# Patient Record
Sex: Male | Born: 1980 | Race: White | Hispanic: No | Marital: Single | Smoking: Current every day smoker
Health system: Southern US, Community
[De-identification: ages and names within clinical notes are randomized; demographics above are authoritative.]

---
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
## 2006-11-11 ENCOUNTER — Emergency Department (HOSPITAL_COMMUNITY): Admission: EM | Admit: 2006-11-11 | Discharge: 2006-11-11 | Payer: Self-pay | Admitting: Emergency Medicine

## 2009-07-25 ENCOUNTER — Emergency Department (HOSPITAL_COMMUNITY): Admission: EM | Admit: 2009-07-25 | Discharge: 2009-07-25 | Payer: Self-pay | Admitting: Emergency Medicine

## 2010-09-02 ENCOUNTER — Emergency Department (HOSPITAL_COMMUNITY)
Admission: EM | Admit: 2010-09-02 | Discharge: 2010-09-02 | Disposition: A | Discharge status: Home / Self Care | Payer: Medicaid Other | Attending: Emergency Medicine | Admitting: Emergency Medicine

## 2010-09-02 DIAGNOSIS — R220 Localized swelling, mass and lump, head: Secondary | ICD-10-CM | POA: Insufficient documentation

## 2010-09-02 DIAGNOSIS — K047 Periapical abscess without sinus: Secondary | ICD-10-CM | POA: Insufficient documentation

## 2010-09-02 DIAGNOSIS — K089 Disorder of teeth and supporting structures, unspecified: Secondary | ICD-10-CM | POA: Insufficient documentation

## 2010-09-02 DIAGNOSIS — K029 Dental caries, unspecified: Secondary | ICD-10-CM | POA: Insufficient documentation

## 2010-11-22 ENCOUNTER — Emergency Department (HOSPITAL_COMMUNITY)
Admission: EM | Admit: 2010-11-22 | Discharge: 2010-11-23 | Disposition: A | Discharge status: Home / Self Care | Payer: Medicaid Other | Attending: Emergency Medicine | Admitting: Emergency Medicine

## 2010-11-22 DIAGNOSIS — R6883 Chills (without fever): Secondary | ICD-10-CM | POA: Insufficient documentation

## 2010-11-22 DIAGNOSIS — R220 Localized swelling, mass and lump, head: Secondary | ICD-10-CM | POA: Insufficient documentation

## 2010-11-22 DIAGNOSIS — K089 Disorder of teeth and supporting structures, unspecified: Secondary | ICD-10-CM | POA: Insufficient documentation

## 2010-11-22 DIAGNOSIS — R221 Localized swelling, mass and lump, neck: Secondary | ICD-10-CM | POA: Insufficient documentation

## 2010-11-22 DIAGNOSIS — K029 Dental caries, unspecified: Secondary | ICD-10-CM | POA: Insufficient documentation

## 2011-01-26 ENCOUNTER — Emergency Department (HOSPITAL_COMMUNITY)
Admission: EM | Admit: 2011-01-26 | Discharge: 2011-01-26 | Disposition: A | Discharge status: Home / Self Care | Payer: Medicaid Other | Attending: Emergency Medicine | Admitting: Emergency Medicine

## 2011-01-26 DIAGNOSIS — K089 Disorder of teeth and supporting structures, unspecified: Secondary | ICD-10-CM | POA: Insufficient documentation

## 2011-01-26 DIAGNOSIS — K0401 Reversible pulpitis: Secondary | ICD-10-CM | POA: Insufficient documentation

## 2011-01-26 DIAGNOSIS — K0381 Cracked tooth: Secondary | ICD-10-CM | POA: Insufficient documentation

## 2011-01-26 DIAGNOSIS — K029 Dental caries, unspecified: Secondary | ICD-10-CM | POA: Insufficient documentation

## 2011-01-27 ENCOUNTER — Emergency Department (HOSPITAL_COMMUNITY)
Admission: EM | Admit: 2011-01-27 | Discharge: 2011-01-27 | Disposition: A | Discharge status: Home / Self Care | Payer: Medicaid Other | Attending: Emergency Medicine | Admitting: Emergency Medicine

## 2011-01-27 DIAGNOSIS — K089 Disorder of teeth and supporting structures, unspecified: Secondary | ICD-10-CM | POA: Insufficient documentation

## 2011-01-27 DIAGNOSIS — K029 Dental caries, unspecified: Secondary | ICD-10-CM | POA: Insufficient documentation

## 2011-01-27 DIAGNOSIS — Z79899 Other long term (current) drug therapy: Secondary | ICD-10-CM | POA: Insufficient documentation

## 2011-10-02 ENCOUNTER — Encounter (HOSPITAL_COMMUNITY): Payer: Self-pay | Admitting: *Deleted

## 2011-10-02 ENCOUNTER — Emergency Department (HOSPITAL_COMMUNITY)
Admission: EM | Admit: 2011-10-02 | Discharge: 2011-10-02 | Disposition: A | Discharge status: Home / Self Care | Payer: Medicaid Other | Attending: Emergency Medicine | Admitting: Emergency Medicine

## 2011-10-02 DIAGNOSIS — K047 Periapical abscess without sinus: Secondary | ICD-10-CM

## 2011-10-02 DIAGNOSIS — K0889 Other specified disorders of teeth and supporting structures: Secondary | ICD-10-CM

## 2011-10-02 DIAGNOSIS — F17200 Nicotine dependence, unspecified, uncomplicated: Secondary | ICD-10-CM | POA: Insufficient documentation

## 2011-10-02 MED ORDER — CLINDAMYCIN HCL 300 MG PO CAPS: 300.0000 mg | ORAL_CAPSULE | Freq: Four times a day (QID) | ORAL | Status: AC

## 2011-10-02 NOTE — ED Provider Notes (Signed)
History     CSN: [id number]  Arrival date & time 10/02/11  0035   First MD Initiated Contact with Patient 10/02/11 0206      Chief Complaint  Patient presents with  . Dental Pain     HPI  History provided by the patient. Patient is a 31 year old male with no significant past medical history who presents with complaints of increased right canine tooth pain for the past several days. Patient reports having history of multiple teeth extractions due to poor dentition and caries. Patient states that originally he was supposed to have the right canine tooth removed as well but states that his oral surgeon missed this tooth. He has had off-and-on pains for many months. Patient reports having some swelling of the gums above tooth with occasional drainage of pus. This is worse in the morning. Pain of the tooth is worse with pressure eating. Patient denies any fever, chills, sweats. Pain is described as severe. He denies any other aggravating or alleviating factors.   History reviewed. No pertinent past medical history.  History reviewed. No pertinent past surgical history.  History reviewed. No pertinent family history.  History  Substance Use Topics  . Smoking status: Current Everyday Smoker  . Smokeless tobacco: Not on file  . Alcohol Use: Yes      Review of Systems  Constitutional: Negative for fever and chills.  All other systems reviewed and are negative.    Allergies  Amoxicillin and Penicillins  Home Medications  No current outpatient prescriptions on file.  BP 125/76  Pulse 67  Temp(Src) 98.6 F (37 C) (Oral)  Resp 16  SpO2 99%  Physical Exam  Nursing note and vitals reviewed. Constitutional: He is oriented to person, place, and time. He appears well-developed and well-nourished. No distress.  HENT:  Head: Normocephalic.  Mouth/Throat: Oropharynx is clear and moist.         Right upper canine tooth. Mild swelling and tenderness to the gums superior.  Multiple extracted teeth throughout mouth. Widespread dental decay of remaining teeth.  Neck: Normal range of motion. Neck supple.  Cardiovascular: Normal rate and regular rhythm.   Pulmonary/Chest: Effort normal and breath sounds normal. No respiratory distress. He has no wheezes.  Abdominal: Soft.  Lymphadenopathy:    He has no cervical adenopathy.  Neurological: He is alert and oriented to person, place, and time.  Psychiatric: He has a normal mood and affect. His behavior is normal.    ED Course  Procedures     1. Pain, dental   2. Periapical abscess       MDM  1:30 AM patient seen and evaluated. Patient in no acute distress.        Angus Seller, PA 10/02/11 2005

## 2011-10-02 NOTE — Discharge Instructions (Signed)
Please followup with a dentist or oral surgeon for continued evaluation and treatment. Return if you have any worsening pain, increased swelling, fever, chills, sweats.  Abscessed Tooth A tooth abscess is a collection of infected fluid (pus) from a bacterial infection in the inner part of the tooth (pulp). It usually occurs at the end of the tooth's root.  CAUSES   A very bad cavity (extensive tooth decay).   Trauma to the tooth, such as a broken or chipped tooth, that allows bacteria to enter into the pulp.  SYMPTOMS  Severe pain in and around the infected tooth.   Swelling and redness around the abscessed tooth or in the mouth or face.   Tenderness.   Pus drainage.   Bad breath.   Bitter taste in the mouth.   Difficulty swallowing.   Difficulty opening the mouth.   Feeling sick to your stomach (nauseous).   Vomiting.   Chills.   Swollen neck glands.  DIAGNOSIS  A medical and dental history will be taken.   An examination will be performed by tapping on the abscessed tooth.   X-rays may be taken of the tooth to identify the abscess.  TREATMENT The goal of treatment is to eliminate the infection.   You may be prescribed antibiotic medicine to stop the infection from spreading.   A root canal may be performed to save the tooth. If the tooth cannot be saved, it may be pulled (extracted) and the abscess may be drained.  HOME CARE INSTRUCTIONS  Only take over-the-counter or prescription medicines for pain, fever, or discomfort as directed by your caregiver.   Do not drive after taking pain medicine (narcotics).   Rinse your mouth (gargle) often with salt water ( tsp salt in 8 oz of warm water) to relieve pain or swelling.   Do not apply heat to the outside of your face.   Return to your dentist for further treatment as directed.  SEEK IMMEDIATE DENTAL CARE IF:  You have a temperature by mouth above 102 F (38.9 C), not controlled by medicine.   You have  chills or a very bad headache.   You have problems breathing or swallowing.   Your have trouble opening your mouth.   You develop swelling in the neck or around the eye.   Your pain is not helped by medicine.   Your pain is getting worse instead of better.  Document Released: 07/08/2005 Document Revised: 06/27/2011 Document Reviewed: 10/16/2010 [facility] Patient Information 2012 [place], [place].    Dental Care and Dentist Visits Dental care supports good overall health. Regular dental visits can also help you avoid dental pain, bleeding, infection, and other more serious health problems in the future. It is important to keep the mouth healthy because diseases in the teeth, gums, and other oral tissues can spread to other areas of the body. Some problems, such as diabetes, heart disease, and pre-term labor have been associated with poor oral health.  See your dentist every 6 months. If you experience emergency problems such as a toothache or broken tooth, go to the dentist right away. If you see your dentist regularly, you may catch problems early. It is easier to be treated for problems in the early stages.  WHAT TO EXPECT AT A DENTIST VISIT  Your dentist will look for many common oral health problems and recommend proper treatment. At your regular dental visit, you can expect:  Gentle cleaning of the teeth and gums. This includes scraping and polishing.  This helps to remove the sticky substance around the teeth and gums (plaque). Plaque forms in the mouth shortly after eating. Over time, plaque hardens on the teeth as tartar. If tartar is not removed regularly, it can cause problems. Cleaning also helps remove stains.   Periodic X-rays. These pictures of the teeth and supporting bone will help your dentist assess the health of your teeth.   Periodic fluoride treatments. Fluoride is a natural mineral shown to help strengthen teeth. Fluoride treatmentinvolves applying a fluoride gel or  varnish to the teeth. It is most commonly done in children.   Examination of the mouth, tongue, jaws, teeth, and gums to look for any oral health problems, such as:   Cavities (dental caries). This is decay on the tooth caused by plaque, sugar, and acid in the mouth. It is best to catch a cavity when it is small.   Inflammation of the gums caused by plaque buildup (gingivitis).   Problems with the mouth or malformed or misaligned teeth.   Oral cancer or other diseases of the soft tissues or jaws.  KEEP YOUR TEETH AND GUMS HEALTHY For healthy teeth and gums, follow these general guidelines as well as your dentist's specific advice:  Have your teeth professionally cleaned at the dentist every 6 months.   Brush twice daily with a fluoride toothpaste.   Floss your teeth daily.   Ask your dentist if you need fluoride supplements, treatments, or fluoride toothpaste.   Eat a healthy diet. Reduce foods and drinks with added sugar.   Avoid smoking.  TREATMENT FOR ORAL HEALTH PROBLEMS If you have oral health problems, treatment varies depending on the conditions present in your teeth and gums.  Your caregiver will most likely recommend good oral hygiene at each visit.   For cavities, gingivitis, or other oral health disease, your caregiver will perform a procedure to treat the problem. This is typically done at a separate appointment. Sometimes your caregiver will refer you to another dental specialist for specific tooth problems or for surgery.  SEEK IMMEDIATE DENTAL CARE IF:  You have pain, bleeding, or soreness in the gum, tooth, jaw, or mouth area.   A permanent tooth becomes loose or separated from the gum socket.   You experience a blow or injury to the mouth or jaw area.  Document Released: 03/20/2011 Document Revised: 06/27/2011 Document Reviewed: 03/20/2011 [facility] Patient Information 2012 [place], [place].

## 2011-10-02 NOTE — ED Notes (Signed)
Patient here with c/o right upper tooth pain

## 2011-10-03 NOTE — ED Provider Notes (Signed)
Medical screening examination/treatment/procedure(s) were performed by non-physician practitioner and as supervising physician I was immediately available for consultation/collaboration.  Burdette Gergely, MD 10/03/11 0246 

## 2016-05-04 ENCOUNTER — Inpatient Hospital Stay (HOSPITAL_COMMUNITY)
Admission: EM | Admit: 2016-05-04 | Discharge: 2016-05-10 | DRG: 029 | Disposition: A | Discharge status: Rehabilitation Facility | Payer: Medicaid Other | Attending: Neurological Surgery | Admitting: Neurological Surgery

## 2016-05-04 ENCOUNTER — Encounter (HOSPITAL_COMMUNITY): Payer: Self-pay | Admitting: Emergency Medicine

## 2016-05-04 ENCOUNTER — Emergency Department (HOSPITAL_COMMUNITY): Payer: Medicaid Other

## 2016-05-04 DIAGNOSIS — K5900 Constipation, unspecified: Secondary | ICD-10-CM | POA: Diagnosis not present

## 2016-05-04 DIAGNOSIS — F1010 Alcohol abuse, uncomplicated: Secondary | ICD-10-CM

## 2016-05-04 DIAGNOSIS — R339 Retention of urine, unspecified: Secondary | ICD-10-CM | POA: Diagnosis not present

## 2016-05-04 DIAGNOSIS — Z720 Tobacco use: Secondary | ICD-10-CM

## 2016-05-04 DIAGNOSIS — S14101A Unspecified injury at C1 level of cervical spinal cord, initial encounter: Secondary | ICD-10-CM | POA: Diagnosis present

## 2016-05-04 DIAGNOSIS — S0121XA Laceration without foreign body of nose, initial encounter: Secondary | ICD-10-CM | POA: Diagnosis present

## 2016-05-04 DIAGNOSIS — N319 Neuromuscular dysfunction of bladder, unspecified: Secondary | ICD-10-CM | POA: Diagnosis present

## 2016-05-04 DIAGNOSIS — G8918 Other acute postprocedural pain: Secondary | ICD-10-CM

## 2016-05-04 DIAGNOSIS — Z419 Encounter for procedure for purposes other than remedying health state, unspecified: Secondary | ICD-10-CM

## 2016-05-04 DIAGNOSIS — M6289 Other specified disorders of muscle: Secondary | ICD-10-CM

## 2016-05-04 DIAGNOSIS — S14105A Unspecified injury at C5 level of cervical spinal cord, initial encounter: Principal | ICD-10-CM | POA: Diagnosis present

## 2016-05-04 DIAGNOSIS — S0091XA Abrasion of unspecified part of head, initial encounter: Secondary | ICD-10-CM | POA: Diagnosis present

## 2016-05-04 DIAGNOSIS — F17200 Nicotine dependence, unspecified, uncomplicated: Secondary | ICD-10-CM | POA: Diagnosis present

## 2016-05-04 DIAGNOSIS — S01411A Laceration without foreign body of right cheek and temporomandibular area, initial encounter: Secondary | ICD-10-CM | POA: Diagnosis present

## 2016-05-04 DIAGNOSIS — M792 Neuralgia and neuritis, unspecified: Secondary | ICD-10-CM

## 2016-05-04 DIAGNOSIS — N179 Acute kidney failure, unspecified: Secondary | ICD-10-CM

## 2016-05-04 DIAGNOSIS — R509 Fever, unspecified: Secondary | ICD-10-CM

## 2016-05-04 DIAGNOSIS — R2689 Other abnormalities of gait and mobility: Secondary | ICD-10-CM

## 2016-05-04 DIAGNOSIS — D62 Acute posthemorrhagic anemia: Secondary | ICD-10-CM

## 2016-05-04 DIAGNOSIS — K592 Neurogenic bowel, not elsewhere classified: Secondary | ICD-10-CM | POA: Diagnosis present

## 2016-05-04 DIAGNOSIS — S01511A Laceration without foreign body of lip, initial encounter: Secondary | ICD-10-CM | POA: Diagnosis present

## 2016-05-04 DIAGNOSIS — M6281 Muscle weakness (generalized): Secondary | ICD-10-CM

## 2016-05-04 DIAGNOSIS — D72820 Lymphocytosis (symptomatic): Secondary | ICD-10-CM

## 2016-05-04 DIAGNOSIS — M4802 Spinal stenosis, cervical region: Secondary | ICD-10-CM

## 2016-05-04 DIAGNOSIS — R29898 Other symptoms and signs involving the musculoskeletal system: Secondary | ICD-10-CM

## 2016-05-04 LAB — I-STAT CHEM 8, ED
BUN: 10 mg/dL (ref 6–20)
CHLORIDE: 101 mmol/L (ref 101–111)
CREATININE: 1.2 mg/dL (ref 0.61–1.24)
Calcium, Ion: 1.07 mmol/L — ABNORMAL LOW (ref 1.15–1.40)
Glucose, Bld: 92 mg/dL (ref 65–99)
HEMATOCRIT: 39 % (ref 39.0–52.0)
Hemoglobin: 13.3 g/dL (ref 13.0–17.0)
Potassium: 3.8 mmol/L (ref 3.5–5.1)
Sodium: 138 mmol/L (ref 135–145)
TCO2: 22 mmol/L (ref 0–100)

## 2016-05-04 LAB — CBC
HCT: 39.1 % (ref 39.0–52.0)
HEMOGLOBIN: 13.4 g/dL (ref 13.0–17.0)
MCH: 30.4 pg (ref 26.0–34.0)
MCHC: 34.3 g/dL (ref 30.0–36.0)
MCV: 88.7 fL (ref 78.0–100.0)
Platelets: 193 10*3/uL (ref 150–400)
RBC: 4.41 MIL/uL (ref 4.22–5.81)
RDW: 12.6 % (ref 11.5–15.5)
WBC: 7.8 10*3/uL (ref 4.0–10.5)

## 2016-05-04 LAB — SAMPLE TO BLOOD BANK

## 2016-05-04 LAB — I-STAT CG4 LACTIC ACID, ED: LACTIC ACID, VENOUS: 1.57 mmol/L (ref 0.5–1.9)

## 2016-05-04 MED ORDER — IOPAMIDOL (ISOVUE-300) INJECTION 61%
INTRAVENOUS | Status: AC
  Administered 2016-05-05: 100 mL
  Filled 2016-05-04: qty 100
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
## 2016-05-04 NOTE — ED Notes (Signed)
Pt reports ETOH on board.
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
## 2016-05-05 ENCOUNTER — Inpatient Hospital Stay (HOSPITAL_COMMUNITY): Payer: Medicaid Other | Admitting: Anesthesiology

## 2016-05-05 ENCOUNTER — Emergency Department (HOSPITAL_COMMUNITY): Payer: Medicaid Other

## 2016-05-05 ENCOUNTER — Encounter (HOSPITAL_COMMUNITY)
Admission: EM | Disposition: A | Discharge status: Rehabilitation Facility | Payer: Self-pay | Source: Home / Self Care | Attending: Neurological Surgery

## 2016-05-05 ENCOUNTER — Inpatient Hospital Stay (HOSPITAL_COMMUNITY): Payer: Medicaid Other

## 2016-05-05 ENCOUNTER — Encounter (HOSPITAL_COMMUNITY): Payer: Self-pay | Admitting: Critical Care Medicine

## 2016-05-05 DIAGNOSIS — F1010 Alcohol abuse, uncomplicated: Secondary | ICD-10-CM | POA: Diagnosis present

## 2016-05-05 DIAGNOSIS — G8250 Quadriplegia, unspecified: Secondary | ICD-10-CM | POA: Diagnosis not present

## 2016-05-05 DIAGNOSIS — D62 Acute posthemorrhagic anemia: Secondary | ICD-10-CM | POA: Diagnosis not present

## 2016-05-05 DIAGNOSIS — R339 Retention of urine, unspecified: Secondary | ICD-10-CM | POA: Diagnosis not present

## 2016-05-05 DIAGNOSIS — S0091XA Abrasion of unspecified part of head, initial encounter: Secondary | ICD-10-CM | POA: Diagnosis present

## 2016-05-05 DIAGNOSIS — F17200 Nicotine dependence, unspecified, uncomplicated: Secondary | ICD-10-CM | POA: Diagnosis present

## 2016-05-05 DIAGNOSIS — N319 Neuromuscular dysfunction of bladder, unspecified: Secondary | ICD-10-CM | POA: Diagnosis not present

## 2016-05-05 DIAGNOSIS — S14101A Unspecified injury at C1 level of cervical spinal cord, initial encounter: Secondary | ICD-10-CM | POA: Diagnosis present

## 2016-05-05 DIAGNOSIS — S14101D Unspecified injury at C1 level of cervical spinal cord, subsequent encounter: Secondary | ICD-10-CM | POA: Diagnosis not present

## 2016-05-05 DIAGNOSIS — N390 Urinary tract infection, site not specified: Secondary | ICD-10-CM | POA: Diagnosis not present

## 2016-05-05 DIAGNOSIS — K592 Neurogenic bowel, not elsewhere classified: Secondary | ICD-10-CM | POA: Diagnosis present

## 2016-05-05 DIAGNOSIS — G904 Autonomic dysreflexia: Secondary | ICD-10-CM | POA: Diagnosis not present

## 2016-05-05 DIAGNOSIS — M62838 Other muscle spasm: Secondary | ICD-10-CM | POA: Diagnosis not present

## 2016-05-05 DIAGNOSIS — S01411A Laceration without foreign body of right cheek and temporomandibular area, initial encounter: Secondary | ICD-10-CM | POA: Diagnosis present

## 2016-05-05 DIAGNOSIS — S0121XA Laceration without foreign body of nose, initial encounter: Secondary | ICD-10-CM | POA: Diagnosis present

## 2016-05-05 DIAGNOSIS — M4802 Spinal stenosis, cervical region: Secondary | ICD-10-CM | POA: Diagnosis present

## 2016-05-05 DIAGNOSIS — Z419 Encounter for procedure for purposes other than remedying health state, unspecified: Secondary | ICD-10-CM | POA: Diagnosis not present

## 2016-05-05 DIAGNOSIS — M7989 Other specified soft tissue disorders: Secondary | ICD-10-CM | POA: Diagnosis not present

## 2016-05-05 DIAGNOSIS — K5900 Constipation, unspecified: Secondary | ICD-10-CM | POA: Diagnosis not present

## 2016-05-05 DIAGNOSIS — S01511A Laceration without foreign body of lip, initial encounter: Secondary | ICD-10-CM | POA: Diagnosis present

## 2016-05-05 DIAGNOSIS — M792 Neuralgia and neuritis, unspecified: Secondary | ICD-10-CM | POA: Diagnosis not present

## 2016-05-05 DIAGNOSIS — S14105A Unspecified injury at C5 level of cervical spinal cord, initial encounter: Secondary | ICD-10-CM | POA: Diagnosis present

## 2016-05-05 DIAGNOSIS — S14101S Unspecified injury at C1 level of cervical spinal cord, sequela: Secondary | ICD-10-CM | POA: Diagnosis not present

## 2016-05-05 HISTORY — PX: ANTERIOR CERVICAL DECOMP/DISCECTOMY FUSION: SHX1161

## 2016-05-05 LAB — PROTIME-INR
INR: 1.07
PROTHROMBIN TIME: 13.9 s (ref 11.4–15.2)

## 2016-05-05 LAB — URINALYSIS, ROUTINE W REFLEX MICROSCOPIC
Bilirubin Urine: NEGATIVE
Glucose, UA: NEGATIVE mg/dL
Hgb urine dipstick: NEGATIVE
KETONES UR: NEGATIVE mg/dL
LEUKOCYTES UA: NEGATIVE
Nitrite: NEGATIVE
PROTEIN: NEGATIVE mg/dL
SPECIFIC GRAVITY, URINE: 1.037 — AB (ref 1.005–1.030)
pH: 6 (ref 5.0–8.0)

## 2016-05-05 LAB — MRSA PCR SCREENING: MRSA by PCR: NEGATIVE

## 2016-05-05 LAB — COMPREHENSIVE METABOLIC PANEL
ALBUMIN: 4 g/dL (ref 3.5–5.0)
ALT: 15 U/L — AB (ref 17–63)
AST: 20 U/L (ref 15–41)
Alkaline Phosphatase: 58 U/L (ref 38–126)
Anion gap: 10 (ref 5–15)
BILIRUBIN TOTAL: 0.4 mg/dL (ref 0.3–1.2)
BUN: 10 mg/dL (ref 6–20)
CHLORIDE: 103 mmol/L (ref 101–111)
CO2: 21 mmol/L — ABNORMAL LOW (ref 22–32)
CREATININE: 0.96 mg/dL (ref 0.61–1.24)
Calcium: 8.8 mg/dL — ABNORMAL LOW (ref 8.9–10.3)
GFR calc Af Amer: >60 mL/min (ref >60)
GLUCOSE: 95 mg/dL (ref 65–99)
POTASSIUM: 3.8 mmol/L (ref 3.5–5.1)
Sodium: 134 mmol/L — ABNORMAL LOW (ref 135–145)
Total Protein: 6.4 g/dL — ABNORMAL LOW (ref 6.5–8.1)

## 2016-05-05 LAB — ETHANOL: Alcohol, Ethyl (B): 148 mg/dL — ABNORMAL HIGH (ref <5)

## 2016-05-05 LAB — CDS SEROLOGY

## 2016-05-05 SURGERY — ANTERIOR CERVICAL DECOMPRESSION/DISCECTOMY FUSION 2 LEVELS
Anesthesia: General | Site: Neck

## 2016-05-05 MED ORDER — FLEET ENEMA 7-19 GM/118ML RE ENEM: 1.0000 | ENEMA | Freq: Once | RECTAL | Status: DC | PRN

## 2016-05-05 MED ORDER — HYDROMORPHONE HCL 1 MG/ML IJ SOLN
1.0000 mg | INTRAMUSCULAR | Status: AC | PRN
  Administered 2016-05-08 (×3): 1 mg via INTRAVENOUS
  Filled 2016-05-05 (×3): qty 1

## 2016-05-05 MED ORDER — LIDOCAINE 2% (20 MG/ML) 5 ML SYRINGE
INTRAMUSCULAR | Status: DC | PRN
  Administered 2016-05-05: 80 mg via INTRAVENOUS

## 2016-05-05 MED ORDER — SODIUM CHLORIDE 0.9 % IV SOLN
250.0000 mL | INTRAVENOUS | Status: DC
  Administered 2016-05-09: 250 mL via INTRAVENOUS

## 2016-05-05 MED ORDER — THROMBIN 20000 UNITS EX SOLR
CUTANEOUS | Status: AC
  Filled 2016-05-05: qty 20000

## 2016-05-05 MED ORDER — BISACODYL 10 MG RE SUPP: 10.0000 mg | Freq: Every day | RECTAL | Status: DC | PRN

## 2016-05-05 MED ORDER — BUPIVACAINE-EPINEPHRINE (PF) 0.5% -1:200000 IJ SOLN
INTRAMUSCULAR | Status: AC
  Filled 2016-05-05: qty 30

## 2016-05-05 MED ORDER — VANCOMYCIN HCL IN DEXTROSE 1-5 GM/200ML-% IV SOLN
1000.0000 mg | INTRAVENOUS | Status: AC
  Administered 2016-05-05: 1000 mg via INTRAVENOUS
  Filled 2016-05-05: qty 200

## 2016-05-05 MED ORDER — ONDANSETRON HCL 4 MG/2ML IJ SOLN
INTRAMUSCULAR | Status: DC | PRN
  Administered 2016-05-05: 4 mg via INTRAVENOUS

## 2016-05-05 MED ORDER — GABAPENTIN 300 MG PO CAPS
300.0000 mg | ORAL_CAPSULE | Freq: Three times a day (TID) | ORAL | Status: DC
  Administered 2016-05-05 – 2016-05-10 (×15): 300 mg via ORAL
  Filled 2016-05-05 (×15): qty 1

## 2016-05-05 MED ORDER — ACETAMINOPHEN 10 MG/ML IV SOLN
INTRAVENOUS | Status: DC | PRN
  Administered 2016-05-05: 1000 mg via INTRAVENOUS

## 2016-05-05 MED ORDER — ACETAMINOPHEN 10 MG/ML IV SOLN
INTRAVENOUS | Status: AC
Start: 2016-05-05 — End: 2016-05-05
  Filled 2016-05-05: qty 100

## 2016-05-05 MED ORDER — LACTATED RINGERS IV SOLN
INTRAVENOUS | Status: DC | PRN
  Administered 2016-05-05: 12:00:00 via INTRAVENOUS

## 2016-05-05 MED ORDER — ASPIRIN-ACETAMINOPHEN-CAFFEINE 250-250-65 MG PO TABS
1.0000 | ORAL_TABLET | Freq: Four times a day (QID) | ORAL | Status: DC | PRN
  Filled 2016-05-05: qty 2

## 2016-05-05 MED ORDER — VANCOMYCIN HCL 1000 MG IV SOLR
INTRAVENOUS | Status: AC
  Filled 2016-05-05: qty 1000

## 2016-05-05 MED ORDER — LIDOCAINE-EPINEPHRINE 2 %-1:100000 IJ SOLN
INTRAMUSCULAR | Status: DC | PRN
  Administered 2016-05-05: 10 mL via INTRADERMAL

## 2016-05-05 MED ORDER — OXYCODONE HCL 5 MG/5ML PO SOLN: 5.0000 mg | Freq: Once | ORAL | Status: DC | PRN

## 2016-05-05 MED ORDER — SUGAMMADEX SODIUM 200 MG/2ML IV SOLN
INTRAVENOUS | Status: DC | PRN
  Administered 2016-05-05: 200 mg via INTRAVENOUS

## 2016-05-05 MED ORDER — SODIUM CHLORIDE 0.9% FLUSH
3.0000 mL | Freq: Two times a day (BID) | INTRAVENOUS | Status: DC
  Administered 2016-05-05 – 2016-05-06 (×2): 3 mL via INTRAVENOUS
  Administered 2016-05-07: 10 mL via INTRAVENOUS
  Administered 2016-05-08 – 2016-05-10 (×4): 3 mL via INTRAVENOUS

## 2016-05-05 MED ORDER — PANTOPRAZOLE SODIUM 20 MG PO TBEC
20.0000 mg | DELAYED_RELEASE_TABLET | Freq: Every day | ORAL | Status: DC
  Administered 2016-05-05 – 2016-05-10 (×6): 20 mg via ORAL
  Filled 2016-05-05 (×6): qty 1

## 2016-05-05 MED ORDER — DOCUSATE SODIUM 100 MG PO CAPS
100.0000 mg | ORAL_CAPSULE | Freq: Two times a day (BID) | ORAL | Status: DC
  Administered 2016-05-05 – 2016-05-10 (×10): 100 mg via ORAL
  Filled 2016-05-05 (×10): qty 1

## 2016-05-05 MED ORDER — ROCURONIUM BROMIDE 50 MG/5ML IV SOSY
PREFILLED_SYRINGE | INTRAVENOUS | Status: DC | PRN
  Administered 2016-05-05: 50 mg via INTRAVENOUS
  Administered 2016-05-05: 10 mg via INTRAVENOUS

## 2016-05-05 MED ORDER — LORAZEPAM 2 MG/ML IJ SOLN
1.0000 mg | Freq: Once | INTRAMUSCULAR | Status: AC
  Administered 2016-05-05: 1 mg via INTRAVENOUS

## 2016-05-05 MED ORDER — SURGIFOAM 100 EX MISC
CUTANEOUS | Status: DC | PRN
  Administered 2016-05-05: 20 mL via TOPICAL

## 2016-05-05 MED ORDER — ARTIFICIAL TEARS OP OINT
TOPICAL_OINTMENT | OPHTHALMIC | Status: DC | PRN
  Administered 2016-05-05: 1 via OPHTHALMIC

## 2016-05-05 MED ORDER — SENNA 8.6 MG PO TABS
1.0000 | ORAL_TABLET | Freq: Two times a day (BID) | ORAL | Status: DC
  Administered 2016-05-05 – 2016-05-07 (×4): 8.6 mg via ORAL
  Administered 2016-05-07: 17.2 mg via ORAL
  Administered 2016-05-08 – 2016-05-10 (×5): 8.6 mg via ORAL
  Filled 2016-05-05 (×10): qty 1

## 2016-05-05 MED ORDER — PHENYLEPHRINE 40 MCG/ML (10ML) SYRINGE FOR IV PUSH (FOR BLOOD PRESSURE SUPPORT)
PREFILLED_SYRINGE | INTRAVENOUS | Status: DC | PRN
  Administered 2016-05-05 (×2): 80 ug via INTRAVENOUS
  Administered 2016-05-05: 40 ug via INTRAVENOUS
  Administered 2016-05-05: 80 ug via INTRAVENOUS
  Administered 2016-05-05 (×4): 40 ug via INTRAVENOUS

## 2016-05-05 MED ORDER — THROMBIN 5000 UNITS EX SOLR
OROMUCOSAL | Status: DC | PRN
  Administered 2016-05-05: 5 mL via TOPICAL

## 2016-05-05 MED ORDER — SUCCINYLCHOLINE CHLORIDE 200 MG/10ML IV SOSY
PREFILLED_SYRINGE | INTRAVENOUS | Status: DC | PRN
  Administered 2016-05-05: 100 mg via INTRAVENOUS

## 2016-05-05 MED ORDER — ACETAMINOPHEN 500 MG PO TABS
1000.0000 mg | ORAL_TABLET | Freq: Four times a day (QID) | ORAL | Status: DC
  Administered 2016-05-05 – 2016-05-10 (×19): 1000 mg via ORAL
  Filled 2016-05-05 (×21): qty 2

## 2016-05-05 MED ORDER — FENTANYL CITRATE (PF) 100 MCG/2ML IJ SOLN
INTRAMUSCULAR | Status: DC | PRN
  Administered 2016-05-05 (×2): 50 ug via INTRAVENOUS
  Administered 2016-05-05: 100 ug via INTRAVENOUS

## 2016-05-05 MED ORDER — LORAZEPAM 2 MG/ML IJ SOLN
INTRAMUSCULAR | Status: AC
  Filled 2016-05-05: qty 1

## 2016-05-05 MED ORDER — METHOCARBAMOL 750 MG PO TABS
750.0000 mg | ORAL_TABLET | Freq: Four times a day (QID) | ORAL | Status: DC
  Administered 2016-05-05 – 2016-05-06 (×6): 750 mg via ORAL
  Administered 2016-05-07: 1000 mg via ORAL
  Administered 2016-05-07 – 2016-05-10 (×13): 750 mg via ORAL
  Filled 2016-05-05 (×5): qty 1
  Filled 2016-05-05 (×3): qty 2
  Filled 2016-05-05: qty 1.5
  Filled 2016-05-05 (×2): qty 2
  Filled 2016-05-05: qty 1
  Filled 2016-05-05 (×2): qty 2
  Filled 2016-05-05: qty 1
  Filled 2016-05-05 (×6): qty 2

## 2016-05-05 MED ORDER — OXYCODONE HCL 5 MG PO TABS
5.0000 mg | ORAL_TABLET | ORAL | Status: DC | PRN
  Administered 2016-05-06 – 2016-05-10 (×18): 10 mg via ORAL
  Filled 2016-05-05 (×19): qty 2

## 2016-05-05 MED ORDER — CELECOXIB 200 MG PO CAPS
200.0000 mg | ORAL_CAPSULE | Freq: Two times a day (BID) | ORAL | Status: DC
  Administered 2016-05-05 – 2016-05-10 (×10): 200 mg via ORAL
  Filled 2016-05-05 (×11): qty 1

## 2016-05-05 MED ORDER — THROMBIN 5000 UNITS EX SOLR
CUTANEOUS | Status: AC
  Filled 2016-05-05: qty 10000

## 2016-05-05 MED ORDER — PROPOFOL 10 MG/ML IV BOLUS
INTRAVENOUS | Status: DC | PRN
  Administered 2016-05-05: 20 mg via INTRAVENOUS
  Administered 2016-05-05: 200 mg via INTRAVENOUS
  Administered 2016-05-05: 70 mg via INTRAVENOUS

## 2016-05-05 MED ORDER — ONDANSETRON HCL 4 MG/2ML IJ SOLN
INTRAMUSCULAR | Status: AC
  Filled 2016-05-05: qty 2

## 2016-05-05 MED ORDER — OXYCODONE HCL 5 MG PO TABS: 5.0000 mg | ORAL_TABLET | Freq: Once | ORAL | Status: DC | PRN

## 2016-05-05 MED ORDER — ONDANSETRON HCL 4 MG/2ML IJ SOLN: 4.0000 mg | INTRAMUSCULAR | Status: DC | PRN

## 2016-05-05 MED ORDER — VANCOMYCIN HCL IN DEXTROSE 1-5 GM/200ML-% IV SOLN
1000.0000 mg | Freq: Two times a day (BID) | INTRAVENOUS | Status: DC
  Administered 2016-05-06 – 2016-05-09 (×7): 1000 mg via INTRAVENOUS
  Filled 2016-05-05 (×9): qty 200

## 2016-05-05 MED ORDER — BUPIVACAINE-EPINEPHRINE (PF) 0.5% -1:200000 IJ SOLN
INTRAMUSCULAR | Status: DC | PRN
  Administered 2016-05-05: 10 mL

## 2016-05-05 MED ORDER — ONDANSETRON HCL 4 MG/2ML IJ SOLN: 4.0000 mg | Freq: Once | INTRAMUSCULAR | Status: DC | PRN

## 2016-05-05 MED ORDER — PHENYLEPHRINE 40 MCG/ML (10ML) SYRINGE FOR IV PUSH (FOR BLOOD PRESSURE SUPPORT)
PREFILLED_SYRINGE | INTRAVENOUS | Status: AC
  Filled 2016-05-05: qty 10

## 2016-05-05 MED ORDER — SODIUM CHLORIDE 0.9 % IR SOLN
Status: DC | PRN
  Administered 2016-05-05: 1

## 2016-05-05 MED ORDER — DEXAMETHASONE SODIUM PHOSPHATE 10 MG/ML IJ SOLN
INTRAMUSCULAR | Status: DC | PRN
  Administered 2016-05-05: 10 mg via INTRAVENOUS

## 2016-05-05 MED ORDER — BUPIVACAINE LIPOSOME 1.3 % IJ SUSP
INTRAMUSCULAR | Status: DC | PRN
  Administered 2016-05-05: 20 mL

## 2016-05-05 MED ORDER — FENTANYL CITRATE (PF) 100 MCG/2ML IJ SOLN
INTRAMUSCULAR | Status: AC
  Filled 2016-05-05: qty 2

## 2016-05-05 MED ORDER — DEXAMETHASONE SODIUM PHOSPHATE 10 MG/ML IJ SOLN
INTRAMUSCULAR | Status: AC
  Filled 2016-05-05: qty 1

## 2016-05-05 MED ORDER — BUPIVACAINE LIPOSOME 1.3 % IJ SUSP
20.0000 mL | Freq: Once | INTRAMUSCULAR | Status: DC
  Filled 2016-05-05: qty 20

## 2016-05-05 MED ORDER — SODIUM CHLORIDE 0.9% FLUSH: 3.0000 mL | INTRAVENOUS | Status: DC | PRN

## 2016-05-05 MED ORDER — OXYCODONE HCL ER 20 MG PO T12A
20.0000 mg | EXTENDED_RELEASE_TABLET | Freq: Two times a day (BID) | ORAL | Status: DC
  Administered 2016-05-05 – 2016-05-10 (×10): 20 mg via ORAL
  Filled 2016-05-05 (×10): qty 1

## 2016-05-05 MED ORDER — MIDAZOLAM HCL 5 MG/5ML IJ SOLN
INTRAMUSCULAR | Status: DC | PRN
  Administered 2016-05-05: 2 mg via INTRAVENOUS

## 2016-05-05 MED ORDER — 0.9 % SODIUM CHLORIDE (POUR BTL) OPTIME
TOPICAL | Status: DC | PRN
  Administered 2016-05-05: 1000 mL

## 2016-05-05 MED ORDER — TETANUS-DIPHTH-ACELL PERTUSSIS 5-2.5-18.5 LF-MCG/0.5 IM SUSP
0.5000 mL | Freq: Once | INTRAMUSCULAR | Status: AC
  Administered 2016-05-05: 0.5 mL via INTRAMUSCULAR
  Filled 2016-05-05: qty 0.5

## 2016-05-05 MED ORDER — MENTHOL 3 MG MT LOZG: 1.0000 | LOZENGE | OROMUCOSAL | Status: DC | PRN

## 2016-05-05 MED ORDER — BACITRACIN ZINC 500 UNIT/GM EX OINT
TOPICAL_OINTMENT | CUTANEOUS | Status: AC
  Filled 2016-05-05: qty 28.35

## 2016-05-05 MED ORDER — POTASSIUM CHLORIDE IN NACL 20-0.9 MEQ/L-% IV SOLN
100.0000 mL/h | INTRAVENOUS | Status: DC
  Administered 2016-05-05 – 2016-05-07 (×2): 100 mL/h via INTRAVENOUS
  Filled 2016-05-05 (×7): qty 1000

## 2016-05-05 MED ORDER — SODIUM CHLORIDE 0.9 % IR SOLN
Status: DC | PRN
  Administered 2016-05-05: 500 mL

## 2016-05-05 MED ORDER — VANCOMYCIN HCL 1000 MG IV SOLR
INTRAVENOUS | Status: DC | PRN
  Administered 2016-05-05: 1000 mg

## 2016-05-05 MED ORDER — ALUM & MAG HYDROXIDE-SIMETH 200-200-20 MG/5ML PO SUSP: 30.0000 mL | Freq: Four times a day (QID) | ORAL | Status: DC | PRN

## 2016-05-05 MED ORDER — MEPERIDINE HCL 25 MG/ML IJ SOLN: 6.2500 mg | INTRAMUSCULAR | Status: DC | PRN

## 2016-05-05 MED ORDER — PHENOL 1.4 % MT LIQD: 1.0000 | OROMUCOSAL | Status: DC | PRN

## 2016-05-05 MED ORDER — LIDOCAINE-EPINEPHRINE (PF) 2 %-1:200000 IJ SOLN
INTRAMUSCULAR | Status: AC
  Filled 2016-05-05: qty 20

## 2016-05-05 MED ORDER — CELECOXIB 200 MG PO CAPS
200.0000 mg | ORAL_CAPSULE | Freq: Once | ORAL | Status: AC
  Administered 2016-05-05: 200 mg via ORAL
  Filled 2016-05-05: qty 1

## 2016-05-05 MED ORDER — GABAPENTIN 300 MG PO CAPS
300.0000 mg | ORAL_CAPSULE | Freq: Once | ORAL | Status: AC
  Administered 2016-05-05: 300 mg via ORAL
  Filled 2016-05-05: qty 1

## 2016-05-05 MED ORDER — ENOXAPARIN SODIUM 40 MG/0.4ML ~~LOC~~ SOLN
40.0000 mg | SUBCUTANEOUS | Status: DC
  Administered 2016-05-07 – 2016-05-10 (×4): 40 mg via SUBCUTANEOUS
  Filled 2016-05-05 (×4): qty 0.4

## 2016-05-05 MED ORDER — HYDROMORPHONE HCL 1 MG/ML IJ SOLN: 0.2500 mg | INTRAMUSCULAR | Status: DC | PRN

## 2016-05-05 MED ORDER — MIDAZOLAM HCL 2 MG/2ML IJ SOLN
INTRAMUSCULAR | Status: AC
  Filled 2016-05-05: qty 2

## 2016-05-05 SURGICAL SUPPLY — 70 items
BIT DRILL NEURO 2X3.1 SFT TUCH (MISCELLANEOUS) ×1 IMPLANT
BIT DRILL VUEPOINT II (BIT) ×1 IMPLANT
BLADE SURG 11 STRL SS (BLADE) ×3 IMPLANT
BLADE ULTRA TIP 2M (BLADE) IMPLANT
BUR MATCHSTICK NEURO 3.0 LAGG (BURR) ×3 IMPLANT
CANISTER SUCT 3000ML PPV (MISCELLANEOUS) ×3 IMPLANT
CHLORAPREP W/TINT 26ML (MISCELLANEOUS) ×3 IMPLANT
DECANTER SPIKE VIAL GLASS SM (MISCELLANEOUS) ×3 IMPLANT
DERMABOND ADVANCED (GAUZE/BANDAGES/DRESSINGS) ×4
DERMABOND ADVANCED .7 DNX12 (GAUZE/BANDAGES/DRESSINGS) ×2 IMPLANT
DRAPE C-ARM 42X72 X-RAY (DRAPES) IMPLANT
DRAPE HALF SHEET 40X57 (DRAPES) ×6 IMPLANT
DRAPE LAPAROTOMY 100X72 PEDS (DRAPES) ×3 IMPLANT
DRAPE MICROSCOPE LEICA (MISCELLANEOUS) IMPLANT
DRAPE POUCH INSTRU U-SHP 10X18 (DRAPES) ×3 IMPLANT
DRAPE SHEET LG 3/4 BI-LAMINATE (DRAPES) ×3 IMPLANT
DRILL BIT VUEPOINT II (BIT) ×2
DRILL NEURO 2X3.1 SOFT TOUCH (MISCELLANEOUS) ×3
DRSG OPSITE POSTOP 4X6 (GAUZE/BANDAGES/DRESSINGS) ×3 IMPLANT
ELECT COATED BLADE 2.86 ST (ELECTRODE) ×3 IMPLANT
ELECT REM PT RETURN 9FT ADLT (ELECTROSURGICAL) ×3
ELECTRODE REM PT RTRN 9FT ADLT (ELECTROSURGICAL) ×1 IMPLANT
EVACUATOR 1/8 PVC DRAIN (DRAIN) ×3 IMPLANT
GAUZE SPONGE 4X4 12PLY STRL (GAUZE/BANDAGES/DRESSINGS) IMPLANT
GAUZE SPONGE 4X4 16PLY XRAY LF (GAUZE/BANDAGES/DRESSINGS) IMPLANT
GLOVE BIO SURGEON STRL SZ 6.5 (GLOVE) ×4 IMPLANT
GLOVE BIO SURGEON STRL SZ7 (GLOVE) ×9 IMPLANT
GLOVE BIO SURGEONS STRL SZ 6.5 (GLOVE) ×2
GLOVE BIOGEL PI IND STRL 7.5 (GLOVE) ×1 IMPLANT
GLOVE BIOGEL PI INDICATOR 7.5 (GLOVE) ×2
GLOVE SS BIOGEL STRL SZ 7 (GLOVE) ×2 IMPLANT
GLOVE SUPERSENSE BIOGEL SZ 7 (GLOVE) ×4
GOWN STRL REUS W/ TWL LRG LVL3 (GOWN DISPOSABLE) ×1 IMPLANT
GOWN STRL REUS W/ TWL XL LVL3 (GOWN DISPOSABLE) ×1 IMPLANT
GOWN STRL REUS W/TWL LRG LVL3 (GOWN DISPOSABLE) ×2
GOWN STRL REUS W/TWL XL LVL3 (GOWN DISPOSABLE) ×2
HEMOSTAT POWDER KIT SURGIFOAM (HEMOSTASIS) ×3 IMPLANT
KIT BASIN OR (CUSTOM PROCEDURE TRAY) ×3 IMPLANT
KIT INFUSE X SMALL 1.4CC (Orthopedic Implant) ×3 IMPLANT
KIT ROOM TURNOVER OR (KITS) ×3 IMPLANT
NEEDLE HYPO 21X1.5 SAFETY (NEEDLE) ×6 IMPLANT
NEEDLE SPNL 22GX3.5 QUINCKE BK (NEEDLE) ×3 IMPLANT
NS IRRIG 1000ML POUR BTL (IV SOLUTION) ×3 IMPLANT
PACK LAMINECTOMY NEURO (CUSTOM PROCEDURE TRAY) ×3 IMPLANT
PAD ARMBOARD 7.5X6 YLW CONV (MISCELLANEOUS) ×3 IMPLANT
PATTIES SURGICAL .5X1.5 (GAUZE/BANDAGES/DRESSINGS) IMPLANT
PUTTY BONE ATTRAX 10CC STRIP (Putty) ×3 IMPLANT
ROD VUEPOINT 80MM (Rod) ×3 IMPLANT
RUBBERBAND STERILE (MISCELLANEOUS) IMPLANT
SCREW MA MM 3.5X14 (Screw) ×18 IMPLANT
SCREW SET THREADED (Screw) ×18 IMPLANT
SEALER BIPOLAR AQUA 2.3 (INSTRUMENTS) ×3 IMPLANT
SPONGE INTESTINAL PEANUT (DISPOSABLE) IMPLANT
SPONGE SURGIFOAM ABS GEL SZ50 (HEMOSTASIS) IMPLANT
STAPLER VISISTAT 35W (STAPLE) ×3 IMPLANT
STOCKINETTE 6  STRL (DRAPES) ×2
STOCKINETTE 6 STRL (DRAPES) ×1 IMPLANT
SUT STRATAFIX 1PDS 45CM VIOLET (SUTURE) ×3 IMPLANT
SUT STRATAFIX MNCRL+ 3-0 PS-2 (SUTURE) ×1
SUT STRATAFIX MONOCRYL 3-0 (SUTURE) ×2
SUT STRATAFIX SPIRAL + 2-0 (SUTURE) ×3 IMPLANT
SUT VIC AB 3-0 SH 8-18 (SUTURE) IMPLANT
SUTURE STRATFX MNCRL+ 3-0 PS-2 (SUTURE) ×1 IMPLANT
SYR 20CC LL (SYRINGE) ×3 IMPLANT
SYR 30ML LL (SYRINGE) ×3 IMPLANT
TOWEL OR 17X24 6PK STRL BLUE (TOWEL DISPOSABLE) ×6 IMPLANT
TOWEL OR 17X26 10 PK STRL BLUE (TOWEL DISPOSABLE) ×3 IMPLANT
TUBE CONNECTING 12'X1/4 (SUCTIONS) ×1
TUBE CONNECTING 12X1/4 (SUCTIONS) ×2 IMPLANT
WATER STERILE IRR 1000ML POUR (IV SOLUTION) ×3 IMPLANT
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
## 2016-05-05 NOTE — Progress Notes (Signed)
   05/05/16 0800  Clinical Encounter Type  Visited With Patient  Visit Type Initial  Referral From Nurse  Consult/Referral To Chaplain  Spiritual Encounters  Spiritual Needs Prayer  Stress Factors  Patient Stress Factors Loss of control  Advance Directives (For Healthcare)  Does patient have an advance directive? No  Would patient like information on creating an advanced directive? No - patient declined information  CHP responded to page from nurse.CHP prayed with patient.

## 2016-05-05 NOTE — ED Notes (Signed)
Pt returned from MRI at this time, unable to complete lumbar and thoracic scan d/t anxiety. MD notified.

## 2016-05-05 NOTE — Anesthesia Postprocedure Evaluation (Signed)
Anesthesia Post Note  Patient: Edward [name]  Procedure(s) Performed: Procedure(s) (LRB): POSTERIOR CERVICAL LAMINECTOMY THREE - FIVE WITH FIXATION AND FUSION (N/A)  Patient location during evaluation: PACU Anesthesia Type: General Level of consciousness: awake and alert Pain management: pain level controlled Vital Signs Assessment: post-procedure vital signs reviewed and stable Respiratory status: spontaneous breathing, nonlabored ventilation, respiratory function stable and patient connected to nasal cannula oxygen Cardiovascular status: blood pressure returned to baseline and stable Postop Assessment: no signs of nausea or vomiting Anesthetic complications: no    Last Vitals:  Vitals:   05/05/16 1600 05/05/16 1700  BP: 127/69 130/71  Pulse: 83 92  Resp: 15 16  Temp:      Last Pain:  Vitals:   05/05/16 1100  TempSrc: Oral  PainSc:                  Carrington Mullenax A

## 2016-05-05 NOTE — ED Notes (Signed)
Pt unable to urinate despite large amount of urine in bladder. Plan to cath. MD made aware.

## 2016-05-05 NOTE — Progress Notes (Signed)
Pharmacy Antibiotic Note  Pharmacy consulted for vancomycin post-op prophylaxis. Patient has hemovac will therefore continue until MD deems appropriate.   Vancomycin 1g/12 Monitor renal fx, cultures, VT as indicated    Agapito GamesAlison Devontaye Ground, PharmD, BCPS Clinical Pharmacist 05/05/2016 4:25 PM

## 2016-05-05 NOTE — Transfer of Care (Signed)
Immediate Anesthesia Transfer of Care Note  Patient: Edward [name]  Procedure(s) Performed: Procedure(s): POSTERIOR CERVICAL LAMINECTOMY THREE - FIVE WITH FIXATION AND FUSION (N/A)  Patient Location: PACU  Anesthesia Type:General  Level of Consciousness: awake, alert  and oriented  Airway & Oxygen Therapy: Patient Spontanous Breathing and Patient connected to nasal cannula oxygen  Post-op Assessment: Report given to RN, Post -op Vital signs reviewed and stable and Patient moving all extremities X 4  Post vital signs: Reviewed  Last Vitals:  Vitals:   05/05/16 1100 05/05/16 1430  BP: (!) 127/59   Pulse: 73 (!) 110  Resp: 16 18  Temp: 37.8 C 36.7 C    Last Pain:  Vitals:   05/05/16 1100  TempSrc: Oral  PainSc:          Complications: No apparent anesthesia complications

## 2016-05-05 NOTE — ED Notes (Signed)
MD at bedside. 
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
## 2016-05-05 NOTE — Anesthesia Procedure Notes (Signed)
Procedure Name: Intubation Date/Time: 05/05/2016 12:15 PM Performed by: Glo HerringLEE, Sie Formisano B Pre-anesthesia Checklist: Patient identified, Emergency Drugs available, Suction available, Patient being monitored and Timeout performed Patient Re-evaluated:Patient Re-evaluated prior to inductionOxygen Delivery Method: Circle system utilized Preoxygenation: Pre-oxygenation with 100% oxygen Intubation Type: IV induction Ventilation: Mask ventilation without difficulty Grade View: Grade I Tube type: Oral Tube size: 7.5 mm Number of attempts: 1 Airway Equipment and Method: Stylet and Video-laryngoscopy Placement Confirmation: ETT inserted through vocal cords under direct vision,  positive ETCO2,  CO2 detector and breath sounds checked- equal and bilateral Secured at: 23 cm Tube secured with: Tape

## 2016-05-05 NOTE — ED Notes (Signed)
Family at bedside. 

## 2016-05-05 NOTE — Anesthesia Preprocedure Evaluation (Signed)
Anesthesia Evaluation  Patient identified by MRN, date of birth, ID band Patient awake    Reviewed: Allergy & Precautions, NPO status , Patient's Chart, lab work & pertinent test results  Airway Mallampati: I  TM Distance: >3 FB Neck ROM: Full    Dental  (+) Edentulous Upper, Edentulous Lower, Dental Advisory Given   Pulmonary Current Smoker,    breath sounds clear to auscultation       Cardiovascular  Rhythm:Regular Rate:Normal     Neuro/Psych    GI/Hepatic   Endo/Other    Renal/GU      Musculoskeletal   Abdominal   Peds  Hematology   Anesthesia Other Findings   Reproductive/Obstetrics                             Anesthesia Physical Anesthesia Plan  ASA: I and emergent  Anesthesia Plan: General   Post-op Pain Management:    Induction: Intravenous  Airway Management Planned: Oral ETT and Video Laryngoscope Planned  Additional Equipment:   Intra-op Plan:   Post-operative Plan: Extubation in OR  Informed Consent: I have reviewed the patients History and Physical, chart, labs and discussed the procedure including the risks, benefits and alternatives for the proposed anesthesia with the patient or authorized representative who has indicated his/her understanding and acceptance.   Dental advisory given  Plan Discussed with: CRNA, Anesthesiologist and Surgeon  Anesthesia Plan Comments:         Anesthesia Quick Evaluation

## 2016-05-05 NOTE — ED Notes (Signed)
Escorted patient to CT at this time

## 2016-05-06 ENCOUNTER — Encounter (HOSPITAL_COMMUNITY): Payer: Self-pay | Admitting: Neurological Surgery

## 2016-05-06 LAB — CBC
HEMATOCRIT: 37.4 % — AB (ref 39.0–52.0)
HEMOGLOBIN: 12.4 g/dL — AB (ref 13.0–17.0)
MCH: 30.1 pg (ref 26.0–34.0)
MCHC: 33.2 g/dL (ref 30.0–36.0)
MCV: 90.8 fL (ref 78.0–100.0)
Platelets: 181 10*3/uL (ref 150–400)
RBC: 4.12 MIL/uL — AB (ref 4.22–5.81)
RDW: 12.9 % (ref 11.5–15.5)
WBC: 12.4 10*3/uL — AB (ref 4.0–10.5)

## 2016-05-06 LAB — BASIC METABOLIC PANEL
ANION GAP: 7 (ref 5–15)
BUN: 13 mg/dL (ref 6–20)
CHLORIDE: 108 mmol/L (ref 101–111)
CO2: 24 mmol/L (ref 22–32)
CREATININE: 1.26 mg/dL — AB (ref 0.61–1.24)
Calcium: 8.6 mg/dL — ABNORMAL LOW (ref 8.9–10.3)
GFR calc non Af Amer: >60 mL/min (ref >60)
Glucose, Bld: 134 mg/dL — ABNORMAL HIGH (ref 65–99)
POTASSIUM: 4.1 mmol/L (ref 3.5–5.1)
SODIUM: 139 mmol/L (ref 135–145)

## 2016-05-06 NOTE — Progress Notes (Signed)
No acute events Hand function improving slightly Otherwise unchanged Continue supportive care and therapy
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
## 2016-05-06 NOTE — Progress Notes (Signed)
Visited with patient and father in room, Joe Sr. & Drusilla KannerJoe Jr. Patient had seen other chaplains, always appreciates prayer, identified self as from nondenominational tradition. Offered spiritual/emotional support and prayer -- including gift of prayer shawl for Jr. & New Testament/Psalms for Sr.. Father said Montez HagemanJr. would recover & cried, said how hard it was to see son in that condition. Son said he felt a bit stronger. Chaplain available for follow-up.   05/06/16 1700  Clinical Encounter Type  Visited With Patient and family together  Visit Type Initial;Psychological support;Spiritual support;Social support  Referral From Family;Chaplain;Nurse  Spiritual Encounters  Spiritual Needs Sacred text;Prayer;Emotional;Other (Comment)  Stress Factors  Patient Stress Factors Health changes;Loss of control  Family Stress Factors Family relationships;Loss of control

## 2016-05-06 NOTE — Progress Notes (Signed)
Inpatient Rehabilitation  PT and OT are recommending IP rehab.  At this time, we are recommending IP Rehab consult.  Please order consult if you are in agreement.    Weldon PickingSusan Lyell Clugston PT Inpatient Rehab Admissions Coordinator Cell [phone] Office [phone]

## 2016-05-06 NOTE — Progress Notes (Signed)
Orthopedic Tech Progress Note Patient Details:  Edward [name] 03/28/1981 [id number]  Patient ID: Edward [name], male   DOB: 06/01/1981, 35 y.o.   MRN: [id number]   [name] 05/06/2016, 4:27 PMCalled [employer] for Bilateral resting hand splint.
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
## 2016-05-06 NOTE — Progress Notes (Signed)
Orthopedic Tech Progress Note Patient Details:  Edward HerbJoseph [name] 09/27/1980 [id number]  Ortho Devices Ortho Device/Splint Location: Bilateral prafo Ortho Device/Splint Interventions: Application   Edward [name] 05/06/2016, 4:18 PM

## 2016-05-07 DIAGNOSIS — D72820 Lymphocytosis (symptomatic): Secondary | ICD-10-CM

## 2016-05-07 DIAGNOSIS — S14101D Unspecified injury at C1 level of cervical spinal cord, subsequent encounter: Secondary | ICD-10-CM

## 2016-05-07 DIAGNOSIS — Z419 Encounter for procedure for purposes other than remedying health state, unspecified: Secondary | ICD-10-CM

## 2016-05-07 DIAGNOSIS — R29898 Other symptoms and signs involving the musculoskeletal system: Secondary | ICD-10-CM

## 2016-05-07 DIAGNOSIS — D62 Acute posthemorrhagic anemia: Secondary | ICD-10-CM

## 2016-05-07 DIAGNOSIS — F1010 Alcohol abuse, uncomplicated: Secondary | ICD-10-CM

## 2016-05-07 DIAGNOSIS — M792 Neuralgia and neuritis, unspecified: Secondary | ICD-10-CM

## 2016-05-07 DIAGNOSIS — M4802 Spinal stenosis, cervical region: Secondary | ICD-10-CM

## 2016-05-07 DIAGNOSIS — N179 Acute kidney failure, unspecified: Secondary | ICD-10-CM

## 2016-05-07 DIAGNOSIS — G8918 Other acute postprocedural pain: Secondary | ICD-10-CM

## 2016-05-07 DIAGNOSIS — R509 Fever, unspecified: Secondary | ICD-10-CM

## 2016-05-07 DIAGNOSIS — Z720 Tobacco use: Secondary | ICD-10-CM

## 2016-05-07 LAB — BASIC METABOLIC PANEL
Anion gap: 5 (ref 5–15)
BUN: 10 mg/dL (ref 6–20)
CO2: 25 mmol/L (ref 22–32)
Calcium: 8.3 mg/dL — ABNORMAL LOW (ref 8.9–10.3)
Chloride: 110 mmol/L (ref 101–111)
Creatinine, Ser: 1.28 mg/dL — ABNORMAL HIGH (ref 0.61–1.24)
GFR calc Af Amer: >60 mL/min (ref >60)
GLUCOSE: 109 mg/dL — AB (ref 65–99)
POTASSIUM: 4.1 mmol/L (ref 3.5–5.1)
Sodium: 140 mmol/L (ref 135–145)
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
## 2016-05-07 NOTE — Progress Notes (Signed)
No acute events Moving arms somewhat better Otherwise trace strength throughout Sensation somewhat improving TTF Rehab admission pending

## 2016-05-07 NOTE — Progress Notes (Signed)
I will follow up tomorrow with pt and family to begin discussions concerning an inpt rehab admission when medically ready. [phone]
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
## 2016-05-07 NOTE — Plan of Care (Signed)
Problem: Nutrition: Goal: Adequate nutrition will be maintained Outcome: Completed/Met Date Met: 05/07/16 Eating and drinking, with assistance, regular diet

## 2016-05-08 LAB — VANCOMYCIN, TROUGH: Vancomycin Tr: 10 ug/mL — ABNORMAL LOW (ref 15–20)

## 2016-05-08 MED ORDER — BETHANECHOL CHLORIDE 10 MG PO TABS
10.0000 mg | ORAL_TABLET | Freq: Four times a day (QID) | ORAL | Status: DC
  Administered 2016-05-08 – 2016-05-09 (×6): 10 mg via ORAL
  Filled 2016-05-08 (×9): qty 1

## 2016-05-08 MED ORDER — WHITE PETROLATUM GEL
Status: AC
  Administered 2016-05-08: 01:00:00
  Filled 2016-05-08: qty 1

## 2016-05-08 NOTE — Progress Notes (Signed)
Orthopedic Tech Progress Note Patient Details:  Edward [name] 06/10/1981 [id number]  Ortho Devices Type of Ortho Device: Soft collar Ortho Device/Splint Location: at bedside Ortho Device/Splint Interventions: Casandra DoffingOrdered   Harland Aguiniga Craig 05/08/2016, 4:36 PM
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
## 2016-05-08 NOTE — Progress Notes (Signed)
Visited with patient, providing spiritual/emotional support. Chaplain available for follow-up.   05/08/16 1700  Clinical Encounter Type  Visited With Patient  Visit Type Follow-up;Psychological support;Spiritual support  Referral From Chaplain  Spiritual Encounters  Spiritual Needs Emotional  Stress Factors  Patient Stress Factors Health changes;Loss of control

## 2016-05-08 NOTE — Progress Notes (Signed)
OT NOTE  OT issued BIL wrist extension splints. Pt and girlfriend educated on use of splints with demonstration. Pt able to bring R hand to mouth but will require support at elbow to complete spoon to mouth.   Girlfriend ( peggy) with multiple questions regarding care and follow-up. She would really like Dr Bevely Palmeritty to explain the damage to the spinal cord to her regarding surgery details that OT can not provided. Rn Larey BrickMari made aware.    Time 16:00- 16:45  Edward [name], Edward [name]   OTR/L Pager: [phone] Office: [phone] .

## 2016-05-08 NOTE — Progress Notes (Signed)
No acute events Motor strength improving Urinary retention Start urecholine [facility] Rehab when able

## 2016-05-08 NOTE — Progress Notes (Signed)
I met with pt and his girlfriend at bedside with therapy. We discussed an inpt rehab admission and they are in agreement. I am hopeful for a bed over the next 24 to 48 hrs. [phone]
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
## 2016-05-09 MED ORDER — METHOCARBAMOL 750 MG PO TABS: 750.0000 mg | ORAL_TABLET | Freq: Four times a day (QID) | ORAL | 2 refills | Status: DC | PRN

## 2016-05-09 MED ORDER — GABAPENTIN 300 MG PO CAPS: 300.0000 mg | ORAL_CAPSULE | Freq: Three times a day (TID) | ORAL | 2 refills | Status: DC

## 2016-05-09 MED ORDER — HYDROCODONE-ACETAMINOPHEN 7.5-325 MG PO TABS: 1.0000 | ORAL_TABLET | Freq: Four times a day (QID) | ORAL | 0 refills | Status: DC | PRN

## 2016-05-09 MED ORDER — ZOLPIDEM TARTRATE 5 MG PO TABS
5.0000 mg | ORAL_TABLET | Freq: Every evening | ORAL | Status: DC | PRN
  Administered 2016-05-09: 5 mg via ORAL
  Filled 2016-05-09: qty 1

## 2016-05-09 MED ORDER — WHITE PETROLATUM GEL
Status: AC
  Administered 2016-05-09: 1
  Filled 2016-05-09: qty 1

## 2016-05-09 NOTE — Progress Notes (Signed)
PT Cancellation Note  Patient Details Name: Edward [name] MRN: [id number] DOB: 09/12/1980   Cancelled Treatment:    Reason Eval/Treat Not Completed: Patient declined, no reason specified.  Pt stated he was in a lot of pain from staying in the chair yesterday and wanted to mobility today. 05/09/2016  [place] BingKen [name], PT [phone]  (pager)   Tyeesha Riker, Eliseo GumKenneth V 05/09/2016, 2:56 PM

## 2016-05-09 NOTE — Progress Notes (Signed)
There is not an inpt rehab bed available to admit this pt today. Bed is available tomorrow. [phone]

## 2016-05-09 NOTE — Progress Notes (Signed)
No acute events Neurological function unchanged Incision looks good D/c to rehab today

## 2016-05-09 NOTE — Progress Notes (Signed)
Patient A/O, noted some muscle stiffness. RN and family rotating on assisting patient with bed exercise, exercises make him feel better. Educated patient medications, tolerated well. Continued to apply ice pack to neck. Urecholine(bethanechol) has been added to his allergy list resulted in the side effects. Encourage patient and family to continue assisting patient with mouth care and exercise. Express to family, if he left in room alone please make sure his soft call light is at his reach example please behind patient shoulder and notified staff; resulted in at one point patient was left alone and was yelling out for help. With encouragement patient can move BUE/BLE some, he notes "I have to concentrate real hard." Pain has been managed by schedule med and prn pain regimen. Staff will continue to motivate, monitor, and maintain safety.
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
## 2016-05-09 NOTE — Progress Notes (Addendum)
Patient noted family, "hot flashes, sweating just hot and itching." Administered the Bethanechol appx 1327, family notified staff apprx 1400. Neck swelling noted last night. Research the medication noted those are side effects. Notified pharmacy to confirm findings. Patient denies any problems with swallowing. Staff has been applying ice pack to the area. At this time he does not want benadryl.
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
## 2016-05-09 NOTE — Progress Notes (Signed)
Per Dr. Roseanne RenoStewart, C give oxy IR 0430 dose now. Patient administered 10 mg PO oxy IR and repositioned to right side with wedge support. RN will continue to monitor.

## 2016-05-09 NOTE — Progress Notes (Signed)
Notified MDs, office resulted in the side effect of the Bethanechol; will hold medication until we hear back from MDs or his office

## 2016-05-09 NOTE — Progress Notes (Signed)
Patient c/o of generalized body aches 8/10 and pressure at surgical site unrelieved by repositioning, ice and 10 mg  Oxy IR for breakthrough pain. Patient states he has not slept in days. [facility] on call paged. RN will continue to monitor.

## 2016-05-10 ENCOUNTER — Inpatient Hospital Stay (HOSPITAL_COMMUNITY)
Admission: RE | Admit: 2016-05-10 | Discharge: 2016-06-15 | DRG: 560 | Disposition: A | Discharge status: Home Health | Payer: Medicaid Other | Source: Intra-hospital | Attending: Physical Medicine & Rehabilitation | Admitting: Physical Medicine & Rehabilitation

## 2016-05-10 ENCOUNTER — Encounter (HOSPITAL_COMMUNITY): Payer: Self-pay | Admitting: *Deleted

## 2016-05-10 ENCOUNTER — Inpatient Hospital Stay (HOSPITAL_COMMUNITY)
Admission: RE | Admit: 2016-05-10 | Payer: Medicaid Other | Source: Intra-hospital | Admitting: Physical Medicine & Rehabilitation

## 2016-05-10 DIAGNOSIS — M792 Neuralgia and neuritis, unspecified: Secondary | ICD-10-CM | POA: Diagnosis not present

## 2016-05-10 DIAGNOSIS — A499 Bacterial infection, unspecified: Secondary | ICD-10-CM

## 2016-05-10 DIAGNOSIS — R21 Rash and other nonspecific skin eruption: Secondary | ICD-10-CM | POA: Diagnosis present

## 2016-05-10 DIAGNOSIS — R252 Cramp and spasm: Secondary | ICD-10-CM | POA: Diagnosis present

## 2016-05-10 DIAGNOSIS — M62838 Other muscle spasm: Secondary | ICD-10-CM

## 2016-05-10 DIAGNOSIS — G8250 Quadriplegia, unspecified: Secondary | ICD-10-CM | POA: Diagnosis not present

## 2016-05-10 DIAGNOSIS — Z880 Allergy status to penicillin: Secondary | ICD-10-CM

## 2016-05-10 DIAGNOSIS — N319 Neuromuscular dysfunction of bladder, unspecified: Secondary | ICD-10-CM | POA: Diagnosis present

## 2016-05-10 DIAGNOSIS — G904 Autonomic dysreflexia: Secondary | ICD-10-CM | POA: Diagnosis present

## 2016-05-10 DIAGNOSIS — Z4789 Encounter for other orthopedic aftercare: Secondary | ICD-10-CM | POA: Diagnosis not present

## 2016-05-10 DIAGNOSIS — Z87891 Personal history of nicotine dependence: Secondary | ICD-10-CM

## 2016-05-10 DIAGNOSIS — D62 Acute posthemorrhagic anemia: Secondary | ICD-10-CM | POA: Diagnosis present

## 2016-05-10 DIAGNOSIS — Z981 Arthrodesis status: Secondary | ICD-10-CM

## 2016-05-10 DIAGNOSIS — S14101S Unspecified injury at C1 level of cervical spinal cord, sequela: Secondary | ICD-10-CM

## 2016-05-10 DIAGNOSIS — S14101A Unspecified injury at C1 level of cervical spinal cord, initial encounter: Secondary | ICD-10-CM | POA: Diagnosis present

## 2016-05-10 DIAGNOSIS — S14101D Unspecified injury at C1 level of cervical spinal cord, subsequent encounter: Secondary | ICD-10-CM

## 2016-05-10 DIAGNOSIS — F329 Major depressive disorder, single episode, unspecified: Secondary | ICD-10-CM | POA: Diagnosis present

## 2016-05-10 DIAGNOSIS — F1010 Alcohol abuse, uncomplicated: Secondary | ICD-10-CM | POA: Diagnosis present

## 2016-05-10 DIAGNOSIS — K592 Neurogenic bowel, not elsewhere classified: Secondary | ICD-10-CM | POA: Diagnosis present

## 2016-05-10 DIAGNOSIS — N390 Urinary tract infection, site not specified: Secondary | ICD-10-CM | POA: Diagnosis present

## 2016-05-10 DIAGNOSIS — B9620 Unspecified Escherichia coli [E. coli] as the cause of diseases classified elsewhere: Secondary | ICD-10-CM | POA: Diagnosis present

## 2016-05-10 DIAGNOSIS — M7989 Other specified soft tissue disorders: Secondary | ICD-10-CM | POA: Diagnosis not present

## 2016-05-10 DIAGNOSIS — Z888 Allergy status to other drugs, medicaments and biological substances status: Secondary | ICD-10-CM | POA: Diagnosis not present

## 2016-05-10 DIAGNOSIS — S14154D Other incomplete lesion at C4 level of cervical spinal cord, subsequent encounter: Secondary | ICD-10-CM | POA: Diagnosis present

## 2016-05-10 LAB — CBC
HCT: 38.1 % — ABNORMAL LOW (ref 39.0–52.0)
Hemoglobin: 12.5 g/dL — ABNORMAL LOW (ref 13.0–17.0)
MCH: 29.9 pg (ref 26.0–34.0)
MCHC: 32.8 g/dL (ref 30.0–36.0)
MCV: 91.1 fL (ref 78.0–100.0)
Platelets: 246 10*3/uL (ref 150–400)
RBC: 4.18 MIL/uL — ABNORMAL LOW (ref 4.22–5.81)
RDW: 12.5 % (ref 11.5–15.5)
WBC: 9.6 10*3/uL (ref 4.0–10.5)

## 2016-05-10 LAB — CREATININE, SERUM: Creatinine, Ser: 1.04 mg/dL (ref 0.61–1.24)

## 2016-05-10 MED ORDER — CELECOXIB 200 MG PO CAPS
200.0000 mg | ORAL_CAPSULE | Freq: Two times a day (BID) | ORAL | Status: DC
  Administered 2016-05-10 – 2016-05-29 (×38): 200 mg via ORAL
  Filled 2016-05-10 (×38): qty 1

## 2016-05-10 MED ORDER — ACETAMINOPHEN 325 MG PO TABS: 325.0000 mg | ORAL_TABLET | ORAL | Status: DC | PRN

## 2016-05-10 MED ORDER — OXYCODONE HCL 5 MG PO TABS
5.0000 mg | ORAL_TABLET | ORAL | Status: DC | PRN
  Administered 2016-05-10 – 2016-05-11 (×2): 10 mg via ORAL
  Administered 2016-05-11: 5 mg via ORAL
  Administered 2016-05-11 – 2016-05-22 (×57): 10 mg via ORAL
  Administered 2016-05-22: 5 mg via ORAL
  Administered 2016-05-22 – 2016-05-30 (×24): 10 mg via ORAL
  Administered 2016-05-31: 5 mg via ORAL
  Administered 2016-05-31 – 2016-06-02 (×3): 10 mg via ORAL
  Administered 2016-06-03: 5 mg via ORAL
  Administered 2016-06-03 – 2016-06-10 (×35): 10 mg via ORAL
  Administered 2016-06-10: 5 mg via ORAL
  Administered 2016-06-10 – 2016-06-13 (×10): 10 mg via ORAL
  Administered 2016-06-13: 5 mg via ORAL
  Administered 2016-06-14 (×3): 10 mg via ORAL
  Filled 2016-05-10 (×78): qty 2
  Filled 2016-05-10: qty 1
  Filled 2016-05-10 (×22): qty 2
  Filled 2016-05-10: qty 1
  Filled 2016-05-10 (×8): qty 2
  Filled 2016-05-10: qty 1
  Filled 2016-05-10 (×23): qty 2
  Filled 2016-05-10: qty 1
  Filled 2016-05-10 (×10): qty 2

## 2016-05-10 MED ORDER — ONDANSETRON HCL 4 MG/2ML IJ SOLN
4.0000 mg | Freq: Four times a day (QID) | INTRAMUSCULAR | Status: DC | PRN
  Filled 2016-05-10: qty 2

## 2016-05-10 MED ORDER — ONDANSETRON HCL 4 MG/2ML IJ SOLN: 4.0000 mg | Freq: Four times a day (QID) | INTRAMUSCULAR | Status: DC | PRN

## 2016-05-10 MED ORDER — BACLOFEN 10 MG PO TABS: 10.0000 mg | ORAL_TABLET | Freq: Two times a day (BID) | ORAL | Status: DC

## 2016-05-10 MED ORDER — OXYCODONE HCL 5 MG PO TABS
5.0000 mg | ORAL_TABLET | ORAL | Status: DC | PRN
  Administered 2016-05-10: 10 mg via ORAL
  Filled 2016-05-10: qty 2

## 2016-05-10 MED ORDER — BACLOFEN 10 MG PO TABS
10.0000 mg | ORAL_TABLET | Freq: Two times a day (BID) | ORAL | Status: DC
  Administered 2016-05-10 – 2016-05-11 (×2): 10 mg via ORAL
  Filled 2016-05-10 (×2): qty 1

## 2016-05-10 MED ORDER — ACETAMINOPHEN 325 MG PO TABS
325.0000 mg | ORAL_TABLET | Freq: Four times a day (QID) | ORAL | Status: DC | PRN
  Administered 2016-05-11: 650 mg via ORAL
  Administered 2016-05-11: 325 mg via ORAL
  Administered 2016-05-12 – 2016-06-09 (×6): 650 mg via ORAL
  Filled 2016-05-10 (×8): qty 2

## 2016-05-10 MED ORDER — ONDANSETRON HCL 4 MG PO TABS
4.0000 mg | ORAL_TABLET | Freq: Four times a day (QID) | ORAL | Status: DC | PRN
  Administered 2016-05-27: 4 mg via ORAL
  Filled 2016-05-10 (×4): qty 1

## 2016-05-10 MED ORDER — ASPIRIN-ACETAMINOPHEN-CAFFEINE 250-250-65 MG PO TABS
1.0000 | ORAL_TABLET | Freq: Four times a day (QID) | ORAL | Status: DC | PRN
  Filled 2016-05-10: qty 2

## 2016-05-10 MED ORDER — METHOCARBAMOL 500 MG PO TABS
500.0000 mg | ORAL_TABLET | Freq: Four times a day (QID) | ORAL | Status: DC | PRN
  Administered 2016-05-10 – 2016-05-17 (×12): 500 mg via ORAL
  Filled 2016-05-10 (×13): qty 1

## 2016-05-10 MED ORDER — SENNA 8.6 MG PO TABS
1.0000 | ORAL_TABLET | Freq: Two times a day (BID) | ORAL | Status: DC
  Administered 2016-05-10 – 2016-05-13 (×6): 8.6 mg via ORAL
  Filled 2016-05-10 (×6): qty 1

## 2016-05-10 MED ORDER — GABAPENTIN 300 MG PO CAPS: 300.0000 mg | ORAL_CAPSULE | Freq: Three times a day (TID) | ORAL | Status: DC

## 2016-05-10 MED ORDER — SORBITOL 70 % SOLN: 30.0000 mL | Freq: Every day | Status: DC | PRN

## 2016-05-10 MED ORDER — GABAPENTIN 300 MG PO CAPS
300.0000 mg | ORAL_CAPSULE | Freq: Three times a day (TID) | ORAL | Status: DC
  Administered 2016-05-10 – 2016-05-12 (×5): 300 mg via ORAL
  Filled 2016-05-10 (×5): qty 1

## 2016-05-10 MED ORDER — OXYCODONE HCL ER 10 MG PO T12A: 20.0000 mg | EXTENDED_RELEASE_TABLET | Freq: Two times a day (BID) | ORAL | Status: DC

## 2016-05-10 MED ORDER — METHOCARBAMOL 500 MG PO TABS: 500.0000 mg | ORAL_TABLET | Freq: Four times a day (QID) | ORAL | Status: DC | PRN

## 2016-05-10 MED ORDER — PANTOPRAZOLE SODIUM 20 MG PO TBEC
20.0000 mg | DELAYED_RELEASE_TABLET | Freq: Every day | ORAL | Status: DC
  Administered 2016-05-10 – 2016-06-14 (×36): 20 mg via ORAL
  Filled 2016-05-10 (×36): qty 1

## 2016-05-10 MED ORDER — CELECOXIB 200 MG PO CAPS
200.0000 mg | ORAL_CAPSULE | Freq: Two times a day (BID) | ORAL | Status: DC
  Filled 2016-05-10: qty 1

## 2016-05-10 MED ORDER — ENOXAPARIN SODIUM 30 MG/0.3ML ~~LOC~~ SOLN
30.0000 mg | SUBCUTANEOUS | Status: DC
  Administered 2016-05-10 – 2016-05-11 (×2): 30 mg via SUBCUTANEOUS
  Filled 2016-05-10 (×2): qty 0.3

## 2016-05-10 MED ORDER — ENOXAPARIN SODIUM 40 MG/0.4ML ~~LOC~~ SOLN: 40.0000 mg | SUBCUTANEOUS | Status: DC

## 2016-05-10 MED ORDER — SENNA 8.6 MG PO TABS: 1.0000 | ORAL_TABLET | Freq: Two times a day (BID) | ORAL | Status: DC

## 2016-05-10 MED ORDER — ENOXAPARIN SODIUM 30 MG/0.3ML ~~LOC~~ SOLN: 30.0000 mg | Freq: Two times a day (BID) | SUBCUTANEOUS | Status: DC

## 2016-05-10 MED ORDER — OXYCODONE HCL ER 10 MG PO T12A
20.0000 mg | EXTENDED_RELEASE_TABLET | Freq: Two times a day (BID) | ORAL | Status: DC
  Administered 2016-05-10 – 2016-06-15 (×72): 20 mg via ORAL
  Filled 2016-05-10 (×73): qty 2

## 2016-05-10 MED ORDER — BISACODYL 10 MG RE SUPP: 10.0000 mg | Freq: Every day | RECTAL | Status: DC | PRN

## 2016-05-10 MED ORDER — PANTOPRAZOLE SODIUM 20 MG PO TBEC: 20.0000 mg | DELAYED_RELEASE_TABLET | Freq: Every day | ORAL | Status: DC

## 2016-05-10 MED ORDER — ONDANSETRON HCL 4 MG PO TABS: 4.0000 mg | ORAL_TABLET | Freq: Four times a day (QID) | ORAL | Status: DC | PRN

## 2016-05-10 NOTE — Care Management Note (Signed)
Case Management Note  Patient Details  Name: Edward [name] MRN: [id number] Date of Birth: 11/14/1980  Subjective/Objective:                    Action/Plan: Pt discharging to CIR today. No further needs per CM.   Expected Discharge Date:                  Expected Discharge Plan:  IP Rehab Facility  In-House Referral:     Discharge planning Services  CM Consult  Post Acute Care Choice:    Choice offered to:     DME Arranged:    DME Agency:     HH Arranged:    HH Agency:     Status of Service:  Completed, signed off  If discussed at [employer] Length of Stay Meetings, dates discussed:    Additional Comments:  Edward BaloKelli F [name], RN 05/10/2016, 10:47 AM

## 2016-05-10 NOTE — Progress Notes (Signed)
Patient shouting, frustrated, orientation level fluctuating between oriented to person and oriented x 4. RN has continually reoriented patient since [place] administration. Patient pain 10/10. Patient given scheduled medications, provided reassurance, reoriented and repositioned. Patients significant other slept in waiting room, updated outside patients room. RN will continue to monitor.
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
## 2016-05-10 NOTE — Progress Notes (Signed)
Patient woke up at 0020 confused, agitated. Patient states he is tired of being in room. Patient reoriented, reassured, repositioned and encouraged. RN provided distraction, oral car, passive range of motion to all extremeties. RN will continue to monitor.

## 2016-05-10 NOTE — Interval H&P Note (Signed)
Edward [name] was admitted today to Inpatient Rehabilitation with the diagnosis of cervical SCI.  The patient's history has been reviewed, patient examined, and there is no change in status.  Patient continues to be appropriate for intensive inpatient rehabilitation.  I have reviewed the patient's chart and labs.  Questions were answered to the patient's satisfaction. The PAPE has been reviewed and assessment remains appropriate.  Rakel Junio T 05/10/2016, 6:46 PM

## 2016-05-10 NOTE — Progress Notes (Signed)
Patient and family were informed about rehab process,including safety plan and rehab booklet.
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
## 2016-05-10 NOTE — Progress Notes (Signed)
Inpt rehab bed is available today and we will plan admission today. Pt is in agreement. [phone]

## 2016-05-11 ENCOUNTER — Inpatient Hospital Stay (HOSPITAL_COMMUNITY): Payer: Medicaid Other

## 2016-05-11 ENCOUNTER — Inpatient Hospital Stay (HOSPITAL_COMMUNITY): Payer: Medicaid Other | Admitting: Physical Therapy

## 2016-05-11 DIAGNOSIS — M62838 Other muscle spasm: Secondary | ICD-10-CM

## 2016-05-11 DIAGNOSIS — N319 Neuromuscular dysfunction of bladder, unspecified: Secondary | ICD-10-CM

## 2016-05-11 MED ORDER — MELATONIN 3 MG PO TABS
6.0000 mg | ORAL_TABLET | Freq: Every day | ORAL | Status: DC
  Administered 2016-05-11 – 2016-05-20 (×7): 6 mg via ORAL
  Filled 2016-05-11 (×12): qty 2

## 2016-05-11 MED ORDER — BACLOFEN 10 MG PO TABS
10.0000 mg | ORAL_TABLET | Freq: Three times a day (TID) | ORAL | Status: DC
  Administered 2016-05-11 – 2016-05-13 (×6): 10 mg via ORAL
  Filled 2016-05-11 (×6): qty 1

## 2016-05-11 NOTE — Progress Notes (Signed)
10/21//17 1114 nursing Patient and significant other was asking for ground pass. Per patient it has been days that he did not smoke and he needs to smoke. RN explained that smoking is not allowed within the  hospital campus. Offered nicotine patch but patient refused at this time.

## 2016-05-11 NOTE — Plan of Care (Signed)
Problem: SCI BOWEL ELIMINATION Goal: RH STG MANAGE BOWEL WITH ASSISTANCE STG Manage Bowel with Min Assistance.   Outcome: Not Progressing Patient refuses to d/c foley today MD notified d/c foley tom
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
## 2016-05-12 ENCOUNTER — Encounter (HOSPITAL_COMMUNITY): Payer: Self-pay

## 2016-05-12 ENCOUNTER — Inpatient Hospital Stay (HOSPITAL_COMMUNITY): Payer: Medicaid Other

## 2016-05-12 ENCOUNTER — Inpatient Hospital Stay (HOSPITAL_COMMUNITY): Payer: Self-pay | Admitting: Occupational Therapy

## 2016-05-12 DIAGNOSIS — M7989 Other specified soft tissue disorders: Secondary | ICD-10-CM

## 2016-05-12 DIAGNOSIS — K592 Neurogenic bowel, not elsewhere classified: Secondary | ICD-10-CM

## 2016-05-12 MED ORDER — ENOXAPARIN SODIUM 40 MG/0.4ML ~~LOC~~ SOLN
40.0000 mg | SUBCUTANEOUS | Status: DC
  Administered 2016-05-12 – 2016-06-09 (×29): 40 mg via SUBCUTANEOUS
  Filled 2016-05-12 (×29): qty 0.4

## 2016-05-12 MED ORDER — POLYETHYLENE GLYCOL 3350 17 G PO PACK
17.0000 g | PACK | Freq: Every day | ORAL | Status: DC
  Administered 2016-05-12 – 2016-06-13 (×22): 17 g via ORAL
  Filled 2016-05-12 (×33): qty 1

## 2016-05-12 MED ORDER — GABAPENTIN 300 MG PO CAPS
600.0000 mg | ORAL_CAPSULE | Freq: Three times a day (TID) | ORAL | Status: DC
  Administered 2016-05-12 – 2016-06-15 (×102): 600 mg via ORAL
  Filled 2016-05-12 (×103): qty 2

## 2016-05-12 NOTE — Progress Notes (Signed)
*  PRELIMINARY RESULTS* Vascular Ultrasound Lower extremity venous duplex has been completed.  Preliminary findings: No evidence of DVT or baker's cyst.  Farrel DemarkJill Eunice, RDMS, RVT  05/12/2016, 8:50 AM

## 2016-05-12 NOTE — Plan of Care (Signed)
Problem: SCI BOWEL ELIMINATION Goal: RH STG MANAGE BOWEL WITH ASSISTANCE STG Manage Bowel with Min Assistance.   Outcome: Not Progressing Per pt BM x1/ week ; MD notified ; laxatives on board

## 2016-05-12 NOTE — Plan of Care (Signed)
Problem: SCI BLADDER ELIMINATION Goal: RH STG MANAGE BLADDER WITH EQUIPMENT WITH ASSISTANCE STG Manage Bladder With Equipment With Max Assistance   Outcome: Not Progressing Pt refuses to d/c foley at this time

## 2016-05-12 NOTE — Progress Notes (Signed)
05/12/16 1239 nursing Patient is aware that his foley need to come out today but he request to take it out the latest today; he claims he will let RN know also refuses his miralax at this time requests to take it after therapy.
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
## 2016-05-12 NOTE — Plan of Care (Signed)
Problem: RH PAIN MANAGEMENT Goal: RH STG PAIN MANAGED AT OR BELOW PT'S PAIN GOAL Pain less than 5 on a scale 0-10  Outcome: Not Progressing Pain med every 3 hours

## 2016-05-12 NOTE — Progress Notes (Signed)
Occupational Therapy Session Note  Patient Details  Name: Edward HolmesJoseph G Kuhl MRN: [id number] Date of Birth: 03/23/1981  Today's Date: 05/12/2016 OT Individual Time: [id number] OT Individual Time Calculation (min): 34 min   Short Term Goals: Week 1:  OT Short Term Goal 1 (Week 1): Patient will complete bed mobility, rolling to right and left with max assist X 1. OT Short Term Goal 2 (Week 1): Pt will tolerate static sitting at raised mat with mod assist to maintain balance for 5 min OT Short Term Goal 3 (Week 1): Pt will complete slide board transfer from EOB to w/c with max assist X 1 OT Short Term Goal 4 (Week 1): Pt will demo ability to direct caregiver in PROM to maintain UE flexibility and reduce hypertonicity with supervision using written instructions provided OT Short Term Goal 5 (Week 1): Pt will lift hand to mouth with min assist in prep for self-feeding training using AE and orthotics prn.  Skilled Therapeutic Interventions/Progress Updates:   Pt participated in skilled OT session focusing on family education and NMR for bilateral UEs. Pt was lying supine in bed at time of arrival with family present. Pt was agreeable to tx bedlevel. During exercises, pt exhibited MMT grades of 1/5 L UE shoulder flexion/extension, 2-/5 shoulder abduction, 3/5 elbow flexion/extension, and 0/5 wrist/digit movements. For R UE, 1/5 shoulder flexion/extension, 2-/5 shoulder abduction, 3-/5 elbow flexion/extension with arm in slight internal rotation. Family education provided on proper rolling technique (while adhering to cervical precautions) with emphasis on pressure relief as well as PROM exercises for wrist/fingers. Pts family was actively involved in AAROM/PROM exercises with encouragement to complete every hour with pt outside of tx. At end of tx, pt remained in bed with family present.   Therapy Documentation Precautions:  Precautions Precautions: Fall, Cervical Precaution Comments: collar for comfort (  Dr Ditty ordering soft collar 05/08/16) Cervical Brace: For comfort Restrictions Weight Bearing Restrictions: No General:   Vital Signs: Therapy Vitals Temp: 98.2 F (36.8 C) Temp Source: Oral Pulse Rate: 69 Resp: 16 BP: 131/72 Patient Position (if appropriate): Lying Oxygen Therapy SpO2: 99 % O2 Device: Not Delivered Pain: No c/o pain during session  Pain Assessment Pain Assessment: 0-10 Pain Score: 0-No pain Pain Type: Chronic pain Pain Location: Leg Pain Orientation: Right;Left Pain Descriptors / Indicators: Aching Pain Onset: On-going Patients Stated Pain Goal: 0 Pain Intervention(s): Medication (See eMAR) ADL: ADL ADL Comments: See Functional Assessment Tool     See Function Navigator for Current Functional Status.   Therapy/Group: Individual Therapy  Angelo Prindle A Baldemar Dady 05/12/2016, 7:23 PM

## 2016-05-13 ENCOUNTER — Inpatient Hospital Stay (HOSPITAL_COMMUNITY): Payer: Medicaid Other | Admitting: Occupational Therapy

## 2016-05-13 ENCOUNTER — Inpatient Hospital Stay (HOSPITAL_COMMUNITY): Payer: Medicaid Other | Admitting: Physical Therapy

## 2016-05-13 LAB — COMPREHENSIVE METABOLIC PANEL
ALBUMIN: 3.3 g/dL — AB (ref 3.5–5.0)
ALK PHOS: 79 U/L (ref 38–126)
ALT: 51 U/L (ref 17–63)
AST: 46 U/L — ABNORMAL HIGH (ref 15–41)
Anion gap: 13 (ref 5–15)
BILIRUBIN TOTAL: 0.5 mg/dL (ref 0.3–1.2)
BUN: 23 mg/dL — AB (ref 6–20)
CALCIUM: 9.1 mg/dL (ref 8.9–10.3)
CO2: 20 mmol/L — AB (ref 22–32)
CREATININE: 0.85 mg/dL (ref 0.61–1.24)
Chloride: 105 mmol/L (ref 101–111)
GFR calc Af Amer: >60 mL/min (ref >60)
GFR calc non Af Amer: >60 mL/min (ref >60)
GLUCOSE: 109 mg/dL — AB (ref 65–99)
Potassium: 4.2 mmol/L (ref 3.5–5.1)
SODIUM: 138 mmol/L (ref 135–145)
TOTAL PROTEIN: 6.8 g/dL (ref 6.5–8.1)

## 2016-05-13 LAB — CBC WITH DIFFERENTIAL/PLATELET
BASOS PCT: 1 %
Basophils Absolute: 0.1 10*3/uL (ref 0.0–0.1)
EOS ABS: 0.4 10*3/uL (ref 0.0–0.7)
Eosinophils Relative: 5 %
HEMATOCRIT: 41.6 % (ref 39.0–52.0)
HEMOGLOBIN: 13.6 g/dL (ref 13.0–17.0)
LYMPHS ABS: 1.2 10*3/uL (ref 0.7–4.0)
Lymphocytes Relative: 14 %
MCH: 30.3 pg (ref 26.0–34.0)
MCHC: 32.7 g/dL (ref 30.0–36.0)
MCV: 92.7 fL (ref 78.0–100.0)
MONOS PCT: 11 %
Monocytes Absolute: 0.9 10*3/uL (ref 0.1–1.0)
NEUTROS ABS: 5.7 10*3/uL (ref 1.7–7.7)
NEUTROS PCT: 69 %
Platelets: 265 10*3/uL (ref 150–400)
RBC: 4.49 MIL/uL (ref 4.22–5.81)
RDW: 12.4 % (ref 11.5–15.5)
WBC: 8.2 10*3/uL (ref 4.0–10.5)

## 2016-05-13 MED ORDER — SENNA 8.6 MG PO TABS
2.0000 | ORAL_TABLET | Freq: Two times a day (BID) | ORAL | Status: DC
  Administered 2016-05-13 – 2016-05-16 (×6): 17.2 mg via ORAL
  Filled 2016-05-13 (×6): qty 2

## 2016-05-13 MED ORDER — BACLOFEN 10 MG PO TABS
10.0000 mg | ORAL_TABLET | Freq: Four times a day (QID) | ORAL | Status: DC
  Administered 2016-05-13 – 2016-05-16 (×12): 10 mg via ORAL
  Filled 2016-05-13 (×12): qty 1

## 2016-05-13 MED ORDER — BISACODYL 10 MG RE SUPP: 10.0000 mg | Freq: Every day | RECTAL | Status: DC

## 2016-05-13 MED ORDER — BISACODYL 10 MG RE SUPP
10.0000 mg | Freq: Every day | RECTAL | Status: DC
  Filled 2016-05-13: qty 1

## 2016-05-13 NOTE — Progress Notes (Addendum)
Physical Therapy Session Note  Patient Details  Name: Edward HolmesJoseph G Mohiuddin MRN: [id number] Date of Birth: 02/22/1981  Today's Date: 05/13/2016 PT Individual Time: 0800-0900 PT Individual Time Calculation (min): 60 min    Short Term Goals: Week 1:  PT Short Term Goal 1 (Week 1): patient will [facility] for at least 2 hours at a time.  PT Short Term Goal 2 (Week 1): Patient will performed bed mobility with max Assist   PT Short Term Goal 3 (Week 1): Patient will tolerate sitting EOB x 5 minutes  PT Short Term Goal 4 (Week 1): Patient will performed SB transfer with total Assist +2.   Skilled Therapeutic Interventions/Progress Updates:   Pt received supine in bed, c/o pain as below and agreeable to treatment. All activities require increased time, rest breaks and frequent repositioning due to neck pain. Aspen collar donned initially to stabilize neck during rolling, however removed once in w/c due to discomfort; orders state neck collar for comfort. Rolling R/L with +2A  and pt's SO stabilizing pt's head for comfort, for placement of brief, donning pants and placing Maxi sling. Skin on buttocks WNL, RN alerted. Significant extensor tone in BLEs and difficulty placing LE into hooklying to A with rolling. Transferred bed>w/c with maximove and +2A. Seated in w/c, required +2A for scooting hips back in chair. Unable to tolerate upright sitting. Educated pt and SO in frequent repositioning every 30 min in w/c for pressure relief; SO reports she already has been repositioning every 30 minutes from reclined <>upright when in chair. Pt remained seated in w/c at end of session with RN present, all needs in reach.   Therapy Documentation Precautions:  Precautions Precautions: Fall, Cervical Precaution Comments: collar for comfort ( Dr Ditty ordering soft collar 05/08/16) Cervical Brace: For comfort Restrictions Weight Bearing Restrictions: No Pain: Pain Assessment Pain Assessment:  0-10 Pain Score: 10-Worst pain ever Pain Type: Surgical pain Pain Location: Neck Pain Orientation: Posterior Pain Descriptors / Indicators: Aching;Discomfort Pain Frequency: Constant Pain Onset: On-going Pain Intervention(s): Medication (See eMAR)   See Function Navigator for Current Functional Status.   Therapy/Group: Individual Therapy  Vista Lawmanlizabeth J Tygielski 05/13/2016, 9:05 AM

## 2016-05-13 NOTE — Plan of Care (Signed)
Problem: SCI BLADDER ELIMINATION Goal: RH STG MANAGE BLADDER WITH EQUIPMENT WITH ASSISTANCE STG Manage Bladder With Equipment With Max Assistance   Outcome: Completed/Met Date Met: 05/13/16 Foley D/C'd this morning

## 2016-05-13 NOTE — Progress Notes (Signed)
Occupational Therapy Session Note  Patient Details  Name: Edward HolmesJoseph G Liao MRN: [id number] Date of Birth: 07/21/1981  Today's Date: 05/13/2016 OT Individual Time: 1500-1530 OT Individual Time Calculation (min): 30 min     Skilled Therapeutic Interventions/Progress Updates:    Pt completed BUE AAROM exercises for shoulder flexion 1 set of 10 reps, internal/external rotation 2 sets of 5 reps, and elbow flexion/extension 2 sets of 5 repetitions.  Noted greater strength with elbow flexion compared to extension.  Some active internal and external rotation at trace level noted as well.  No noted wrist or digit movements seen at this time.  All exercises completed in wheelchair with tilt.  Pt left in room at end of session with family present.   Therapy Documentation Precautions:  Precautions Precautions: Fall, Cervical Precaution Comments: collar for comfort ( Dr Ditty ordering soft collar 05/08/16) Cervical Brace: For comfort Restrictions Weight Bearing Restrictions: No  Pain: Pain Assessment Pain Score: 5  Pain Type: Surgical pain Pain Location: Neck Pain Orientation: Posterior Pain Descriptors / Indicators: Aching;Discomfort Pain Intervention(s): Medication (See eMAR)    See Function Navigator for Current Functional Status.   Therapy/Group: Individual Therapy  Cesareo Vickrey OTR/L 05/13/2016, 4:12 PM

## 2016-05-13 NOTE — Progress Notes (Signed)
Occupational Therapy Session Note  Patient Details  Name: Edward [name] MRN: [id number] Date of Birth: 11/30/1980  Today's Date: 05/13/2016 OT Individual Time: 1050-1150 OT Individual Time Calculation (min): 60 min     Short Term Goals: Week 1:  OT Short Term Goal 1 (Week 1): Patient will complete bed mobility, rolling to right and left with max assist X 1. OT Short Term Goal 2 (Week 1): Pt will tolerate static sitting at raised mat with mod assist to maintain balance for 5 min OT Short Term Goal 3 (Week 1): Pt will complete slide board transfer from EOB to w/c with max assist X 1 OT Short Term Goal 4 (Week 1): Pt will demo ability to direct caregiver in PROM to maintain UE flexibility and reduce hypertonicity with supervision using written instructions provided OT Short Term Goal 5 (Week 1): Pt will lift hand to mouth with min assist in prep for self-feeding training using AE and orthotics prn.  Skilled Therapeutic Interventions/Progress Updates:   Patient stated he had just finished a prior session and was too sore and fatigued to participate, but he concurred in gentle UE AAROM and PROM and UE positioning while in his wheel chair.      His fiance shared her concerns regarding the bed possibly making his neck sore and stiff and difficult to participate in therapy.  She stated facilities would replace the bed again if it did not work properly.  Patient may benefit from having having at least 30 - 45 minutes between sessions to recover/recuperate.    Patient was left in the care of his uncle with call bell in place.   (fiance had left to go downstairs to meet a friend for a break).  Therapy Documentation Precautions:  Precautions Precautions: Fall, Cervical Precaution Comments: collar for comfort ( Dr Ditty ordering soft collar 05/08/16) Cervical Brace: For comfort Restrictions Weight Bearing Restrictions: No  Pain: Pain Assessment Pain Score: 5  Pain Type: Surgical pain Pain  Location: Neck Pain Orientation: Posterior Pain Descriptors / Indicators: Aching;Discomfort Pain Intervention(s): Medication (See eMAR)  See Function Navigator for Current Functional Status.   Therapy/Group: Individual Therapy  [name], [name] 05/13/2016, 12:30 PM

## 2016-05-13 NOTE — Progress Notes (Signed)
Occupational Therapy Session Note  Patient Details  Name: Edward [name] MRN: [id number] Date of Birth: 05/31/1981  Today's Date: 05/13/2016 OT Individual Time: 0945-1045 and 1345-1430 OT Individual Time Calculation (min): 60 min and 45 min    Short Term Goals:Week 1:  OT Short Term Goal 1 (Week 1): Patient will complete bed mobility, rolling to right and left with max assist X 1. OT Short Term Goal 2 (Week 1): Pt will tolerate static sitting at raised mat with mod assist to maintain balance for 5 min OT Short Term Goal 3 (Week 1): Pt will complete slide board transfer from EOB to w/c with max assist X 1 OT Short Term Goal 4 (Week 1): Pt will demo ability to direct caregiver in PROM to maintain UE flexibility and reduce hypertonicity with supervision using written instructions provided OT Short Term Goal 5 (Week 1): Pt will lift hand to mouth with min assist in prep for self-feeding training using AE and orthotics prn.  Skilled Therapeutic Interventions/Progress Updates:    Session One: Pt seen for OT session focusing on ADL re-training and upright tolerance. Pt reclined in tilt-in-space w/c upon arrival with girlfriend present. Pt voicing 5/10 pain in neck. He reported being pre-medicated prior to tx session. Pt with difficulty maintaining alertness and eyes open throughout session, requiring VCs throughout.  Grooming tasks completed at sink with steadying assist at wrist and elbow with use of dorsal wrist support universal cuff.Therapist assist required for thoroughness of task.  UB dressing completed with +2 assist. Multiple trials and significantly increased time required to have pt lean forward slightly for shirt to be pulled down in back.  Throughout session, requested UE ROM due to increased pain/ stiffness. Educated throughout regarding muscle pain vs. Nerve pain, positioning for comfort, and SCI. Pt left reclined in w/c at end of session, PRAFO boots donned for ankle positioning  support, and soft call bell at head.  He was diaphoretic throughout task with exertion, BP 115/69 and otherwise assymptomatic.   Session Two: Pt seen for OT session focusing on LE ROM and functional transfers. Pt in tilt-in-space chair upon arrival, voicing feeling much better than this morning and agreeable to tx session.  In therapy gym, LE ROM provided due to significantly increased tone. With stretching, pt able to be positioned with LEs at 90 degrees. Total A +2 sliding board transfer completed to therapy mat. Pt with extensor tone in abdominal muscles, requiring increased assist for anterior lean during transfer. Total A sitting  Balance on EOM. Manual facilitation provided for chest expansion and upright posture. Sliding board transfer completed back to chair in same manner as described above. Returned to room, left in tilt-in-space w/c with call bell at head, all needs met.    Therapy Documentation Precautions:  Precautions Precautions: Fall, Cervical Precaution Comments: collar for comfort ( Dr Ditty ordering soft collar 05/08/16) Cervical Brace: For comfort Restrictions Weight Bearing Restrictions: No ADL: ADL ADL Comments: See Functional Assessment Tool  See Function Navigator for Current Functional Status.   Therapy/Group: Individual Therapy  [name],  C 05/13/2016, 7:08 AM 
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
## 2016-05-14 ENCOUNTER — Inpatient Hospital Stay (HOSPITAL_COMMUNITY): Payer: Self-pay | Admitting: Physical Therapy

## 2016-05-14 ENCOUNTER — Inpatient Hospital Stay (HOSPITAL_COMMUNITY): Payer: Self-pay

## 2016-05-14 ENCOUNTER — Inpatient Hospital Stay (HOSPITAL_COMMUNITY): Payer: Self-pay | Admitting: Occupational Therapy

## 2016-05-14 MED ORDER — BISACODYL 10 MG RE SUPP
10.0000 mg | Freq: Every day | RECTAL | Status: DC
  Administered 2016-05-15 – 2016-05-21 (×7): 10 mg via RECTAL
  Filled 2016-05-14 (×7): qty 1

## 2016-05-14 MED ORDER — DIPHENHYDRAMINE HCL 25 MG PO CAPS
25.0000 mg | ORAL_CAPSULE | Freq: Once | ORAL | Status: DC
  Filled 2016-05-14 (×4): qty 1

## 2016-05-14 NOTE — Progress Notes (Signed)
Physical Therapy Session Note  Patient Details  Name: Edward [name] MRN: [id number] Date of Birth: 04/23/1981  Today's Date: 05/14/2016 PT Individual Time: 1530-1600 PT Individual Time Calculation (min): 30 min    Short Term Goals: Week 1:  PT Short Term Goal 1 (Week 1): patient will tolerate [facility] for at least 2 hours at a time.  PT Short Term Goal 2 (Week 1): Patient will performed bed mobility with max Assist   PT Short Term Goal 3 (Week 1): Patient will tolerate sitting EOB x 5 minutes  PT Short Term Goal 4 (Week 1): Patient will performed SB transfer with total Assist +2.   Skilled Therapeutic Interventions/Progress Updates:  Pt received in w/c with family present.  Pt given the option of tilt table vs. UE exercises.  Pt selected UE exercises from w/c due to plan to perform tilt table with primary PT tomorrow.  Pt transported to gym and set up with bed side table in front of him for UE support; attempted to perform AAROM of RUE with pt leaning forwards but pt reporting increased pain and becoming diaphoretic.  Returned to semi tilted position for increased head support and performed R and LUE AAROM with UE on top of ball performing lateral ABD/ADD, flexion/extension + bilat elbow flexion/extension with assistance to hold ball between hands x 10 reps each.  Educated pt on importance of keeping fingers in flexed position during wrist extension (vs full finger extension) to preserve ROM for use of tenodesis grip.  Returned pt to room and pt left in w/c with social worker present to meet pt and discuss conference.     Therapy Documentation Precautions:  Precautions Precautions: Fall, Cervical Precaution Comments: collar for comfort ( Dr Ditty ordering soft collar 05/08/16) Cervical Brace: For comfort Restrictions Weight Bearing Restrictions: No Vital Signs: Therapy Vitals Temp: 98.7 F (37.1 C) Temp Source: Oral Pulse Rate: 76 Resp: 18 BP: 116/62 Patient Position (if  appropriate): Sitting Oxygen Therapy SpO2: 100 % O2 Device: Not Delivered Pain: Pain Assessment Pain Score: 4  Pain Type: Surgical pain Pain Location: Neck Pain Descriptors / Indicators: Aching Patients Stated Pain Goal: 3 Pain Intervention(s): Medication (See eMAR)    See Function Navigator for Current Functional Status.   Therapy/Group: Individual Therapy  Edward [name], [name] 05/14/2016, 4:48 PM

## 2016-05-14 NOTE — Progress Notes (Signed)
Occupational Therapy Session Note  Patient Details  Name: Edward [name] MRN: [id number] Date of Birth: 08/13/1980  Today's Date: 05/14/2016 OT Individual Time: 0900-1000 OT Individual Time Calculation (min): 60 min     Short Term Goals: Week 1:  OT Short Term Goal 1 (Week 1): Patient will complete bed mobility, rolling to right and left with max assist X 1. OT Short Term Goal 2 (Week 1): Pt will tolerate static sitting at raised mat with mod assist to maintain balance for 5 min OT Short Term Goal 3 (Week 1): Pt will complete slide board transfer from EOB to w/c with max assist X 1 OT Short Term Goal 4 (Week 1): Pt will demo ability to direct caregiver in PROM to maintain UE flexibility and reduce hypertonicity with supervision using written instructions provided OT Short Term Goal 5 (Week 1): Pt will lift hand to mouth with min assist in prep for self-feeding training using AE and orthotics prn.  Skilled Therapeutic Interventions/Progress Updates:    Pt resting in bed upon arrival with GF stretching.  Therapist and Rehab Tech continued stretching BLE with continued spasms/extensor tone noted when supine in bed.  Pt initially engaged in rolling to R and L for LB clothing management and [facility].  Pt required tot A + 2 for bed mobility.  Pt transferred to w/c with [facility] and positioned appropriately in w/c.  Pt appeared to be perspiring profusely with BP as noted.  Pt tot A for UB bathing and doffing/donning pullover shirt.  Pt's w/c headrest readjusted to provided appropriate support for pt. Educated pt and GF on bowel program and importance of establishing a daily schedule for bowel program.  Pt and GF verbalized understanding.  Focus on activity tolerance, bed mobility, BADL retraining, family education, and safety awareness to increase independence and reduce burden of care.  Therapy Documentation Precautions:  Precautions Precautions: Fall, Cervical Precaution  Comments: collar for comfort ( Dr Ditty ordering soft collar 05/08/16) Cervical Brace: For comfort Restrictions Weight Bearing Restrictions: No   Vital Signs: Therapy Vitals Pulse Rate: 62 BP: 123/77 Patient Position (if appropriate): Sitting Pain: Pain Assessment Pain Score: 2, increasing when not supported Pain Type: Surgical pain Pain Location: Neck Pain Descriptors / Indicators: Aching Pain Intervention(s): repositioned  ADL: ADL ADL Comments: See Functional Assessment Tool   See Function Navigator for Current Functional Status.   Therapy/Group: Individual Therapy  [name], Jason Frisbee Chappell 05/14/2016, 12:04 PM
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
## 2016-05-14 NOTE — Progress Notes (Signed)
Occupational Therapy Session Note  Patient Details  Name: Edward [name] MRN: [id number] Date of Birth: 07/05/1981  Today's Date: 05/14/2016 OT Individual Time: 1330-1430 OT Individual Time Calculation (min): 60 min     Short Term Goals:Week 1:  OT Short Term Goal 1 (Week 1): Patient will complete bed mobility, rolling to right and left with max assist X 1. OT Short Term Goal 2 (Week 1): Pt will tolerate static sitting at raised mat with mod assist to maintain balance for 5 min OT Short Term Goal 3 (Week 1): Pt will complete slide board transfer from EOB to w/c with max assist X 1 OT Short Term Goal 4 (Week 1): Pt will demo ability to direct caregiver in PROM to maintain UE flexibility and reduce hypertonicity with supervision using written instructions provided OT Short Term Goal 5 (Week 1): Pt will lift hand to mouth with min assist in prep for self-feeding training using AE and orthotics prn.  Skilled Therapeutic Interventions/Progress Updates:    Pt seen for OT session focusing on self-feeding and education regarding AE. Pt in supine upon arrival, voicing increased fatigue, however, willing to attempt therapy session. LE stretching required due to significant increased tone limiting pt's ability to roll to place maximove pad. Following stretching, pt's knee able to be bent and rolled with max A +1. Pt transferred into tilt in space chair via maximove.  He participated in therapy department fall festival in day room. Worked with pt using R dorsal wrist support universal cuff to begin self feeding. With support at wrist and elbow, pt used fork then spoon to self feed. Pt able to initiate shoulder adduction in attempt to stab food with fork, active elbow flexion in gravity eliminated position to bring to mouth. Extensive education provided to pt's significant other and to pt regarding activity progression with self feeding tasks, UE ROM, and AE.  Pt left in day room with family to cont to  participate in fall festival.   Therapy Documentation Precautions:  Precautions Precautions: Fall, Cervical Precaution Comments: collar for comfort ( Dr Ditty ordering soft collar 05/08/16) Cervical Brace: For comfort Restrictions Weight Bearing Restrictions: No ADL: ADL ADL Comments: See Functional Assessment Tool  See Function Navigator for Current Functional Status.   Therapy/Group: Individual Therapy  [name], [name] 05/14/2016, 7:20 AM

## 2016-05-14 NOTE — Progress Notes (Signed)
Rn discussed and educated pt and wife on establishment of bowel program. RN educated on purpose of bowel program and what the bowel program consist of. Patient and significant other decided 8am would be best fit for them at home. RN to start bowel program today at 8am as discussed yesterday with pt refusing. RN educated and provided encouragement with pt hesitant and continued to refuse. Pt stated he will start at 8am tomorrow morning.   RN provided education to girlfriend and pt on bladder management with I&O cathing q6hrs. RN encouraged girlfriend to start to learn bowel and bladder management for her to self manage. Girlfriend worried about plans for home management. RN will continue to educate on bowel and bladder management.

## 2016-05-14 NOTE — Progress Notes (Signed)
Physical Therapy Session Note  Patient Details  Name: Edward [name] MRN: [id number] Date of Birth: 03/07/1981  Today's Date: 05/14/2016 PT Individual Time: 1030-1200 PT Individual Time Calculation (min): 90 min    Short Term Goals: Week 1:  PT Short Term Goal 1 (Week 1): patient will [facility] for at least 2 hours at a time.  PT Short Term Goal 2 (Week 1): Patient will performed bed mobility with max Assist   PT Short Term Goal 3 (Week 1): Patient will tolerate sitting EOB x 5 minutes  PT Short Term Goal 4 (Week 1): Patient will performed SB transfer with total Assist +2.   Skilled Therapeutic Interventions/Progress Updates:   Pt received seated in w/c, c/o pain as below and agreeable to treatment. Transfer w/c <>mat table with transfer board and totalA +2 with heavy assist for forward trunk flexion due to extensor tone. Sitting balance on edge of mat table in several positions to facilitate BUE weight bearing including sidelying propped on elbow, anterior weight shift with elbows on bedside table, and midline sitting with B hands beside hips. Gentle cervical AAROM rotation R/L and flexion/extension, range limited to 5 degrees or less in all directions AAROM d/t pain. AAROM to bring head to neutral in upright midline sitting, and able to tolerate approximately 30 seconds of independent head control in midline before requiring rest break with therapist supporting head due to pain. Pt declines supine positioning on mat table for BLE PROM d/t fear of pain in neck. Transfer w/c >bed with transfer board and totalA +2. Sit >supine with totalA +2, Supine in bed, therapist performed BLE PROM and hooklying trunk rotation to reduce tone and spasms, with significant clonus noted with passive supine lying. Remained supine in bed with SO present and all needs in reach at end of session.   Therapy Documentation Precautions:  Precautions Precautions: Fall, Cervical Precaution Comments:  collar for comfort ( Dr Ditty ordering soft collar 05/08/16) Cervical Brace: For comfort Restrictions Weight Bearing Restrictions: No Pain: Pain Assessment Pain Score: 10-Worst pain ever Faces Pain Scale: Hurts whole lot Pain Type: Surgical pain Pain Location: Neck Pain Descriptors / Indicators: Aching Pain Intervention(s): Medication (See eMAR)   See Function Navigator for Current Functional Status.   Therapy/Group: Individual Therapy  Vista Lawmanlizabeth J Tygielski 05/14/2016, 12:55 PM

## 2016-05-15 ENCOUNTER — Inpatient Hospital Stay (HOSPITAL_COMMUNITY): Payer: Medicaid Other | Admitting: Occupational Therapy

## 2016-05-15 ENCOUNTER — Inpatient Hospital Stay (HOSPITAL_COMMUNITY): Payer: Self-pay | Admitting: Physical Therapy

## 2016-05-15 ENCOUNTER — Inpatient Hospital Stay (HOSPITAL_COMMUNITY): Payer: Self-pay | Admitting: Occupational Therapy

## 2016-05-15 ENCOUNTER — Inpatient Hospital Stay (HOSPITAL_COMMUNITY): Payer: Medicaid Other
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
## 2016-05-15 NOTE — Progress Notes (Signed)
Social Work Patient ID: Edward [name], male   DOB: 16-Oct-1980, 35 y.o.   MRN: [id number]   Met yesterday with pt and gf following team conference.  They are aware and agreeable with targeted d/c date of 11/17.  Discussed goals of mod/ max assist with probable power w/c.  Good questions about what the assist levels mean in terms of lifting pt.  They spoke easily with me about needing to plan realistic goals all while allowing for hope.  Will continue to follow for support and d/c planning.  Thaily Hackworth, LCSW

## 2016-05-15 NOTE — Progress Notes (Addendum)
Physical Therapy Session Note  Patient Details  Name: Edward [name] MRN: [id number] Date of Birth: 08/17/1980  Today's Date: 05/15/2016 PT Individual Time: 1422-1448 PT Individual Time Calculation (min): 26 min   Short Term Goals: Week 1:  PT Short Term Goal 1 (Week 1): patient [facility] 2 hours at a time.  PT Short Term Goal 2 (Week 1): Patient will performed bed mobility with max Assist   PT Short Term Goal 3 (Week 1): Patient will tolerate sitting EOB x 5 minutes  PT Short Term Goal 4 (Week 1): Patient will performed SB transfer with total Assist +2.      Skilled Therapeutic Interventions/Progress Updates: pt in tilt in space w/c, tilted back significantly.  He tolerated sitting upright in w/c in preparation for transfer.  Attempted slide board transfer w/c> bed, level.  Pt directed PT in sequencing transfer with mod cues.  When this PT flexed pt forward after slide board was placed, pt stated his neck and bil shoulder pain increased and he was unable to tolerate the position for transferring.  Pt repositioned pt in tilted- back position, and did PROM bil LEs. He benefited from grade I oscillations at hips to decrease hypertonus.  PT able to externally rotate R/L hips enough to cross legs at thighs with good tolerance by pt.  Pt passed to next therapist at end of session.      Therapy Documentation Precautions:  Precautions Precautions: Fall, Cervical Precaution Comments: collar for comfort ( Dr Ditty ordering soft collar 05/08/16) Cervical Brace: For comfort Restrictions Weight Bearing Restrictions: No   Pain: Pain Assessment Pain Score: 1 /10  (premedicated for therarpy), at rest in w/c Pain Type: Acute pain Pain Location: Generalized (mostly neck) Pain Descriptors / Indicators: Aching;Discomfort Pain Intervention(s): Medication (See eMAR)   See Function Navigator for Current Functional Status.   Therapy/Group: Individual  Therapy  [name] 05/15/2016, 4:36 PM

## 2016-05-15 NOTE — Care Management Note (Signed)
Inpatient Rehabilitation Center Individual Statement of Services  Patient Name:  Edward [name]  Date:  05/14/2016  Welcome to the Inpatient Rehabilitation Center.  Our goal is to provide you with an individualized program based on your diagnosis and situation, designed to meet your specific needs.  With this comprehensive rehabilitation program, you will be expected to participate in at least 3 hours of rehabilitation therapies Monday-Friday, with modified therapy programming on the weekends.  Your rehabilitation program will include the following services:  Physical Therapy (PT), Occupational Therapy (OT), 24 hour per day rehabilitation nursing, Therapeutic Recreaction (TR), Neuropsychology, Case Management (Social Worker), Rehabilitation Medicine, Nutrition Services and Pharmacy Services  Weekly team conferences will be held on Tuesdays to discuss your progress.  Your Social Worker will talk with you frequently to get your input and to update you on team discussions.  Team conferences with you and your family in attendance may also be held.  Expected length of stay: 4 weeks  Overall anticipated outcome: moderate assistance @ wheelchair  Depending on your progress and recovery, your program may change. Your Social Worker will coordinate services and will keep you informed of any changes. Your Social Worker's name and contact numbers are listed  below.  The following services may also be recommended but are not provided by the Inpatient Rehabilitation Center:   Driving Evaluations  Home Health Rehabiltiation Services  Outpatient Rehabilitation Services  Vocational Rehabilitation   Arrangements will be made to provide these services after discharge if needed.  Arrangements include referral to agencies that provide these services.  Your insurance has been verified to be:  Medicaid Your primary doctor is:  None (Child psychotherapistsocial worker will assist with establishing a primary care  physician)  Pertinent information will be shared with your doctor and your insurance company.  Social Worker:  GodfreyLucy Mirriam V[place], [place] [phone] or ([phone]) [phone]   Information discussed with and copy given to patient by: [name], [name], 05/14/2016, 9:11 AM
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
## 2016-05-15 NOTE — Progress Notes (Signed)
Occupational Therapy Session Note  Patient Details  Name: Edward HolmesJoseph G [name] MRN: [id number] Date of Birth: 05/10/1981  Today's Date: 05/15/2016 OT Individual Time:  -        Short Term Goals: Week 1:  OT Short Term Goal 1 (Week 1): Patient will complete bed mobility, rolling to right and left with max assist X 1. OT Short Term Goal 2 (Week 1): Pt will tolerate static sitting at raised mat with mod assist to maintain balance for 5 min OT Short Term Goal 3 (Week 1): Pt will complete slide board transfer from EOB to w/c with max assist X 1 OT Short Term Goal 4 (Week 1): Pt will demo ability to direct caregiver in PROM to maintain UE flexibility and reduce hypertonicity with supervision using written instructions provided OT Short Term Goal 5 (Week 1): Pt will lift hand to mouth with min assist in prep for self-feeding training using AE and orthotics prn.  Skilled Therapeutic Interventions/Progress Updates:   patient sitting uprigt in chair upon approach for therapy and participated in skilled OT as follows:  * AAROM and PROM in muscles around scapula, shoulders, chest and down to digits.  Care was taken to not 'disturb' the cervical healling surgical site  * required total assist for both hands to be washed and dried  * He required total assistm, both  to don wash mit onto his right hand and to wash face  He was left sitting upright in his wheelchair in the care of his fiance who arrived back into his room during the last 20 minutes of his session. Therapy Documentation Precautions:  Precautions Precautions: Fall, Cervical Precaution Comments: collar for comfort ( Dr Ditty ordering soft collar 05/08/16) Cervical Brace: For comfort Restrictions Weight Bearing Restrictions: No Pain:no complaints during this session    See Function Navigator for Current Functional Status.   Therapy/Group: Individual Therapy  Edward [name], Edward [name] [facility] 05/15/2016, 8:07 PM

## 2016-05-15 NOTE — Progress Notes (Signed)
Occupational Therapy Session Note  Patient Details  Name: Edward HolmesJoseph G Veazie MRN: [id number] Date of Birth: 03/21/1981  Today's Date: 05/15/2016 OT Individual Time: [id number] OT Individual Time Calculation (min): 72 min     Short Term Goals:Week 1:  OT Short Term Goal 1 (Week 1): Patient will complete bed mobility, rolling to right and left with max assist X 1. OT Short Term Goal 2 (Week 1): Pt will tolerate static sitting at raised mat with mod assist to maintain balance for 5 min OT Short Term Goal 3 (Week 1): Pt will complete slide board transfer from EOB to w/c with max assist X 1 OT Short Term Goal 4 (Week 1): Pt will demo ability to direct caregiver in PROM to maintain UE flexibility and reduce hypertonicity with supervision using written instructions provided OT Short Term Goal 5 (Week 1): Pt will lift hand to mouth with min assist in prep for self-feeding training using AE and orthotics prn.  Skilled Therapeutic Interventions/Progress Updates:    Pt seen for OT session focusing on education and mobility. Pt in supine upon arrival with RN and significant other present completing bowel program in supine. While pt cont bowel program extensive education with pt and significant other regarding bowel program, activity progression with [facility], and modified toileting tasks/ positioning. Pt able to strain to assist in moving bowels. Pt rolled with max- total A +2 for hygiene, LB dressing, and maximove sling to be placed.  Transfer completed via mechanical lift. Once in chair, pt voicing increased pain in neck. Focus on adjusting pt with pilows and tilt of chair for comfort. Pt desired to attempt to stay up in chair, left with UEs supported on pillows, significant other present and RN entering. RN made aware of pain, administering medications. Educated regarding importance of checking pt for incontinent BM while up in chair as gravity assists with completely emptying bowels and importance of  maintaining skin integrity.   Therapy Documentation Precautions:  Precautions Precautions: Fall, Cervical Precaution Comments: collar for comfort ( Dr Ditty ordering soft collar 05/08/16) Cervical Brace: For comfort Restrictions Weight Bearing Restrictions: No ADL: ADL ADL Comments: See Functional Assessment Tool  See Function Navigator for Current Functional Status.   Therapy/Group: Individual Therapy  [name], Sebastiano Luecke C 05/15/2016, 7:08 AM
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
## 2016-05-15 NOTE — Progress Notes (Signed)
Physical Therapy Session Note  Patient Details  Name: Edward [name] MRN: [id number] Date of Birth: 12/21/1980  Today's Date: 05/15/2016 PT Individual Time: 1115-1200 and 1448-1540 PT Individual Time Calculation (min): 45 min and 52 min (total 97 min)    Short Term Goals: Week 1:  PT Short Term Goal 1 (Week 1): patient will tolerate OOB activity in WC for at least 2 hours at a time.  PT Short Term Goal 2 (Week 1): Patient will performed bed mobility with max Assist   PT Short Term Goal 3 (Week 1): Patient will tolerate sitting EOB x 5 minutes  PT Short Term Goal 4 (Week 1): Patient will performed SB transfer with total Assist +2.   Skilled Therapeutic Interventions/Progress Updates:   Tx 1: Pt received seated in w/c, c/o pain as below and agreeable to treatment. Transfer w/c <>tilt table with +2 maxi move. Vitals as follows during tilt table: static sitting in w/c 112/59 BP, HR 68; BLE ace wraps donned for BP management. 50 degrees x10 min BP 123/78 HR 72, 70 degrees 128/70 HR 75. While in standing at 50 and 70 degrees pt performed BLE quad sets, glute sets, 1x5 BUE elbow flexion AAROM, 1x5 BUE elbow eccentric control with AAROM. Pt denies light headedness or dizziness, but does have increased bilat shoulder pain, likely due to weight of arms in gravity dependent position, and UEs supported with chest strap. Returned to chair with maxi move as above; returned to room totalA. Pt able to correctly verbalize frequency of pressure relief; educated pt in angle change >60 degrees for effective pressure relief and pt verbalizes understanding, states he usually lowers "as low as it'll go". Remained seated in w/c with father present.  Tx 2: Pt received seated in w/c with handoff from previous therapist, c/o increased neck pain and agreeable to treatment with encouragement and education regarding importance of continued participation despite pain to improve mobility, strength and progress towards goals.  Transfer w/c <>mat table with transfer board and totalA +2. Seated balance on edge of mat table totalA with stability ball behind pt. Table elevated to reduce weight bearing through BLEs and pt able to perform soccer ball kicks with alternating BLE with significant improvement in coordination and reduced tone with LEs returning to neutral position between kicks. Lateral leans R/L with totalA; unable to tolerate position d/t pain. With rehab tech supporting pt's head, therapist performed facilitation for anterior pelvic tilt and thoracic extension for pelvic stability and carryover into sitting balance and posture. Returned to w/c as above. Different w/c leg rests obtained to improve alignment and tone reduction. Remained up in w/c at end of session, SO present and all needs in reach.   Therapy Documentation Precautions:  Precautions Precautions: Fall, Cervical Precaution Comments: collar for comfort ( Dr Ditty ordering soft collar 05/08/16) Cervical Brace: For comfort Restrictions Weight Bearing Restrictions: No Pain: Pain Assessment Pain Score: 4  (premedicated for thearpy) Pain Type: Acute pain Pain Location: Generalized (mostly neck) Pain Descriptors / Indicators: Aching;Discomfort Pain Intervention(s): Medication (See eMAR)   See Function Navigator for Current Functional Status.   Therapy/Group: Individual Therapy  Vista Lawmanlizabeth J Tygielski 05/15/2016, 12:04 PM

## 2016-05-16 ENCOUNTER — Inpatient Hospital Stay (HOSPITAL_COMMUNITY): Payer: Self-pay | Admitting: Occupational Therapy

## 2016-05-16 ENCOUNTER — Inpatient Hospital Stay (HOSPITAL_COMMUNITY): Payer: Medicaid Other | Admitting: Occupational Therapy

## 2016-05-16 ENCOUNTER — Encounter (HOSPITAL_COMMUNITY): Payer: Self-pay

## 2016-05-16 ENCOUNTER — Inpatient Hospital Stay (HOSPITAL_COMMUNITY): Payer: Self-pay | Admitting: Physical Therapy

## 2016-05-16 MED ORDER — CALCIUM POLYCARBOPHIL 625 MG PO TABS
625.0000 mg | ORAL_TABLET | Freq: Every day | ORAL | Status: DC
  Administered 2016-05-16 – 2016-06-15 (×31): 625 mg via ORAL
  Filled 2016-05-16 (×31): qty 1

## 2016-05-16 MED ORDER — SENNOSIDES-DOCUSATE SODIUM 8.6-50 MG PO TABS
2.0000 | ORAL_TABLET | Freq: Every day | ORAL | Status: DC
  Administered 2016-05-16 – 2016-05-30 (×13): 2 via ORAL
  Filled 2016-05-16 (×13): qty 2

## 2016-05-16 MED ORDER — BACLOFEN 5 MG HALF TABLET
15.0000 mg | ORAL_TABLET | Freq: Four times a day (QID) | ORAL | Status: DC
  Administered 2016-05-16: 15 mg via ORAL
  Administered 2016-05-16: 5 mg via ORAL
  Administered 2016-05-16: 10 mg via ORAL
  Administered 2016-05-16 – 2016-05-19 (×10): 15 mg via ORAL
  Filled 2016-05-16 (×12): qty 1
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
## 2016-05-16 NOTE — Progress Notes (Signed)
Physical Therapy Session Note  Patient Details  Name: Edward HolmesJoseph G Leccese MRN: [id number] Date of Birth: 05/02/1981  Today's Date: 05/16/2016 PT Individual Time: 1100-1215 and 1430-1515 PT Individual Time Calculation (min): 75 min and 45 min (total 120 min)    Short Term Goals: Week 1:  PT Short Term Goal 1 (Week 1): patient will tolerate OOB activity in WC for at least 2 hours at a time.  PT Short Term Goal 2 (Week 1): Patient will performed bed mobility with max Assist   PT Short Term Goal 3 (Week 1): Patient will tolerate sitting EOB x 5 minutes  PT Short Term Goal 4 (Week 1): Patient will performed SB transfer with total Assist +2.   Skilled Therapeutic Interventions/Progress Updates:  Tx 1: Pt received supine in bed, c/o 5/10 neck pain and agreeable to treatment. Supine>sit with HOB elevated and totalA +2, with pt attempting to initiate LE movement off edge of bed but unable without maxA. Transfer bed>w/c with transfer board and +2A, heavy assist for facilitating trunk flexion d/t extensor tone. Transfer w/c <>tilt table with totalA +2. Tilt table x10 min at 60 degrees while engaged in BUE bicep curls concentric/eccentric, and quad sets. Vitals WNL throughout, 110/74 BP and HR 84 bpm. Performed x10 min at 70 degrees with wedge under B feet for increase plantarflexor stretch. Educated pt's SO in therapeutic benefits of tilt table. At 30 degrees, performed small range mini-squats with therapist bringing pt into knee flexion, and pt activating B quads to extend to neutral. Returned to bed lateral scoot from tilt table with +3 A for LE, trunk and head management. Rolling R/L with +2A for doffing pants and brief, performing hygiene after incontinent bowel movement, and donning clean brief. Remained supine in bed at end of session, all needs in reach.   Tx 2: Pt received seated in w/c, c/o pain as below and agreeable to treatment. Transfer w/c >mat table +2 for board placement, totalA +1 for transfer.  Sitting balance with facilitation for thoracic/lumbar/cervical extension and weight bearing through BUEs. UE ranger used BUE with improved activation of shoulder flexion/IR/ER/horizonal abduction/adduction on L side compared to R. Anterior weight shift onto BUEs on table in front of pt for forward trunk flexion to reduce tone and carryover into transfers, and weight bearing through BUEs. Returned to w/c with transfer board and +2 totalA. Towel roll placed in low back to facilitate trunk extension for carryover into sitting balance and posture, as well as to reduce sacral sitting in w/c. Remained seated in w/c with SO present and all needs in reach.   Therapy Documentation Precautions:  Precautions Precautions: Fall, Cervical Precaution Comments: collar for comfort ( Dr Ditty ordering soft collar 05/08/16) Cervical Brace: For comfort Restrictions Weight Bearing Restrictions: No Pain: Pain Assessment Pain Assessment: 0-10 Pain Score: 3  Pain Type: Surgical pain Pain Location: Neck Pain Orientation: Posterior;Mid Pain Descriptors / Indicators: Aching Pain Frequency: Intermittent Pain Onset: On-going Patients Stated Pain Goal: 2 Pain Intervention(s): Medication (See eMAR);Repositioned Multiple Pain Sites: No   See Function Navigator for Current Functional Status.   Therapy/Group: Individual Therapy  Vista Lawmanlizabeth J Tygielski 05/16/2016, 12:17 PM
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
## 2016-05-16 NOTE — Progress Notes (Addendum)
Occupational Therapy Session Note  Patient Details  Name: Edward HolmesJoseph G Pinzon MRN: [id number] Date of Birth: 05/05/1981  Today's Date: 05/16/2016 OT Individual Time: 0900-1000 and 1300-1400 OT Individual Time Calculation (min): 60 min and 60 min    Short Term Goals:Week 1:  OT Short Term Goal 1 (Week 1): Patient will complete bed mobility, rolling to right and left with max assist X 1. OT Short Term Goal 2 (Week 1): Pt will tolerate static sitting at raised mat with mod assist to maintain balance for 5 min OT Short Term Goal 3 (Week 1): Pt will complete slide board transfer from EOB to w/c with max assist X 1 OT Short Term Goal 4 (Week 1): Pt will demo ability to direct caregiver in PROM to maintain UE flexibility and reduce hypertonicity with supervision using written instructions provided OT Short Term Goal 5 (Week 1): Pt will lift hand to mouth with min assist in prep for self-feeding training using AE and orthotics prn.  Skilled Therapeutic Interventions/Progress Updates:    Session One: Pt seen for OT session focusing on ADL re-training with UE ROM. Pt in supine upon arrival with girlfriend present. Pt reports having just received suppository as part of bowel program and declining OOB until having chance to void. UB bathing/dressing completed from supine level utilizing bathmit. With steadying assist at elbow and wrist, pt able to actively assist moving arm across chest (pt ~15%). He demonstrated small elbow flexion against gravity movements, however, not at functional level at this time.  Pt able to direct care in prep for bed mobility to maintain comfort when rolling. He required +2 assist to roll to complete hygiene/ change brief and don pants. +2 to don/doff shirt. Pt left in supine at end of session, all needs in reach.  Pt and pt's girlfriend educated throughout session regarding activity progression, SCI, bowel program, and d/c planning.   Session Two: Pt seen for OT session focusing  on self-feeding and  UE ROM. Pt in supine upon arrival with girlfriend present. He voiced having just finished cathing and having not eaten lunch yet, hoping to work on that in therapy.  He rolled with +2 assist to place maximove sling. Pt with increased clonus movements and extensor tone in B LEs. Maximove used to transfer pt to tilt-in-space w/c.  In therapy day room, worked on self-feeding using R universal dorsal wrist. Pt able to actively flex elbow in gravity eliminated position, however, fatigues quickly and unable to tolerate getting fork all the way to mouth. Total A for managing drinking glass. Pt declined practicing sliding board transfer due to "not feeling well". He opted to complete UE ROM. Completed AAROM at all joints on B UEs. Pt tolerated well. Able to actively assist with shoulder flexion and AB/ADduction and elbow flexion/extension. No trace noted in wrist or fingers at this time.  Pt returned to room at end of session, left in tilt-in-space chair with wife present.   Therapy Documentation Precautions:  Precautions Precautions: Fall, Cervical Precaution Comments: collar for comfort ( Dr Ditty ordering soft collar 05/08/16) Cervical Brace: For comfort Restrictions Weight Bearing Restrictions: No ADL: ADL ADL Comments: See Functional Assessment Tool  See Function Navigator for Current Functional Status.   Therapy/Group: Individual Therapy  [name], Opel Lejeune C 05/16/2016, 7:10 AM

## 2016-05-17 ENCOUNTER — Inpatient Hospital Stay (HOSPITAL_COMMUNITY): Payer: Self-pay | Admitting: Occupational Therapy

## 2016-05-17 ENCOUNTER — Inpatient Hospital Stay (HOSPITAL_COMMUNITY): Payer: Self-pay | Admitting: Physical Therapy

## 2016-05-17 LAB — URINALYSIS, ROUTINE W REFLEX MICROSCOPIC
GLUCOSE, UA: NEGATIVE mg/dL
HGB URINE DIPSTICK: NEGATIVE
Ketones, ur: NEGATIVE mg/dL
Leukocytes, UA: NEGATIVE
Nitrite: NEGATIVE
PROTEIN: NEGATIVE mg/dL
Specific Gravity, Urine: 1.03 (ref 1.005–1.030)
pH: 6 (ref 5.0–8.0)

## 2016-05-17 MED ORDER — METHOCARBAMOL 500 MG PO TABS
500.0000 mg | ORAL_TABLET | Freq: Four times a day (QID) | ORAL | Status: DC | PRN
  Administered 2016-05-18 – 2016-06-15 (×67): 500 mg via ORAL
  Filled 2016-05-17 (×68): qty 1

## 2016-05-17 MED ORDER — METHOCARBAMOL 500 MG PO TABS: 500.0000 mg | ORAL_TABLET | Freq: Four times a day (QID) | ORAL | Status: DC

## 2016-05-17 NOTE — Plan of Care (Signed)
Problem: RH Tub/Shower Transfers Goal: LTG Patient will perform tub/shower transfers w/assist (OT) LTG: Patient will perform tub/shower transfers with assist, with/without cues using equipment (OT)  Outcome: Not Applicable Date Met: 12/82/08 Goal d/c as not appropriate at this time. Will review need for goal pending pt progress. Napoleon Form, OTR/L
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
## 2016-05-17 NOTE — Progress Notes (Signed)
Physical Therapy Session Note  Patient Details  Name: Edward [name] MRN: [id number] Date of Birth: [birth year]  Today's Date: 05/17/2016 PT Individual Time: 1000-1045 PT Individual Time Calculation (min): 45 min    Short Term Goals: Week 1:  PT Short Term Goal 1 (Week 1): patient will tolerate OOB activity in [facility] for at least 2 hours at a time.  PT Short Term Goal 2 (Week 1): Patient will performed bed mobility with max Assist   PT Short Term Goal 3 (Week 1): Patient will tolerate sitting EOB x 5 minutes  PT Short Term Goal 4 (Week 1): Patient will performed SB transfer with total Assist +2.     Therapy Documentation Precautions:  Precautions Precautions: Fall, Cervical Precaution Comments: collar for comfort ( Dr Ditty ordering soft collar 05/08/16) Cervical Brace: For comfort Restrictions Weight Bearing Restrictions: No   Patient received seated in tilt and space wheelchair directly from OT Amy.   Patient performed slide board transfer total assist two person to and from mat and wheelchair.    Patient sat edge of mat in high perch position with total assist to maintain position. Focus on positioning in midline. Patient educated on diaphragmatic breathing. Min to Mod verbal cues for diaphragmatic breathing throughout session.   Patient performed sit to and from stand 5x from high perch position edge of mat max assist. Focus on force production and muscle activation at quads and gluts.  Once in standing position patient maintained with total assist emphasis placed on muscle control for mini squats concentric and eccentric quad control with controlled diaphragmatic breathing. Patient able to perform 4-8 mini squats with total assist patient able to actively recruit quad musculature bilaterally and glut muscle activation bilaterally.    Vitals monitored and remained stable throughout session and responded appropriately with activity. Patient returned to room at end of session with  spouse present and all needs met.   See Function Navigator for Current Functional Status.   Therapy/Group: Individual Therapy  Retta Diones 05/17/2016, 10:51 AM

## 2016-05-17 NOTE — Progress Notes (Signed)
Physical Therapy Weekly Progress Note  Patient Details  Name: Edward [name] MRN: [id number] Date of Birth: [birth year]  Beginning of progress report period: May 11, 2016 End of progress report period: May 17, 2016  Today's Date: 05/17/2016 PT Individual Time: 1415-1530 PT Individual Time Calculation (min): 75 min    Patient has met 3 of 4 short term goals with progress made towards unmet goal of bed mobility maxA.  Currently requires totalA to [place] +2 for all mobility including bed mobility, transfers with transfer board, and sit <>stand. Pt limited by severe spasms and extensor tone in BLEs and trunk, however is demonstrating improve isolated control and AROM of BUE/BLE musculature including quads, hip flexors, biceps and shoulder girdle. Pt's SO has been actively participating in all sessions, and performing BLE PROM HEP consistently outside of regular therapy sessions to assist with tone, and assisting pt with pressure relief at regular 30 min intervals in w/c.   Patient continues to demonstrate the following deficits:  activity tolerance, balance, postural control, ability to compensate for deficits and functional use of  right upper extremity, right lower extremity, left upper extremity and left lower extremity and therefore will continue to benefit from skilled PT intervention to enhance overall performance with bed mobility, transfers, sit <>stand, w/c management, home and community access.   Patient progressing toward long term goals..  Continue plan of care.  PT Short Term Goals Week 1:  PT Short Term Goal 1 (Week 1): patient will tolerate OOB activity in [facility] for at least 2 hours at a time.  PT Short Term Goal 1 - Progress (Week 1): Met PT Short Term Goal 2 (Week 1): Patient will performed bed mobility with max Assist   PT Short Term Goal 2 - Progress (Week 1): Progressing toward goal PT Short Term Goal 3 (Week 1): Patient will tolerate sitting EOB x 5 minutes  PT Short  Term Goal 3 - Progress (Week 1): Met PT Short Term Goal 4 (Week 1): Patient will performed SB transfer with total Assist +2.  PT Short Term Goal 4 - Progress (Week 1): Met Week 2:  PT Short Term Goal 1 (Week 2): Pt will perform transfer w/c <>mat/bed with transfer board and consistent maxA +1 PT Short Term Goal 2 (Week 2): Pt will perform bed mobility with maxA +1 PT Short Term Goal 3 (Week 2): Pt will perform static sitting balance with modA PT Short Term Goal 4 (Week 2): Pt will perform dynamic sitting balance with maxA   Skilled Therapeutic Interventions/Progress Updates:   Pt received seated in w/c, c/o 4/10 neck pain and agreeable to treatment. Transfer w/c <>mat table with transfer board and +2A. Session with focus on sitting posture and balance, including sitting in backward chair for hip external rotation and abduction with sheet around trunk to facilitate anterior pelvic tilt and anterior weight shift with reduced extensor tone. Reclined lying on wedge with AAROM trunk flexion with cues for breathing to coordinate exhale with exertion for abdominal activation. Education on LE HEP including long and short arc quads, consistent LE PROM performed by pt's SO. Returned to bed at end of session with maximove, all needs in reach, and NT alerted to pt position following incontinent bowel movement.   Therapy Documentation Precautions:  Precautions Precautions: Fall, Cervical Precaution Comments: collar for comfort ( Dr Ditty ordering soft collar 05/08/16) Cervical Brace: For comfort Restrictions Weight Bearing Restrictions: No   See Function Navigator for Current Functional Status.  Therapy/Group: Individual Therapy  Benjiman Core Tygielski 05/17/2016, 4:41 PM
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
## 2016-05-18 ENCOUNTER — Inpatient Hospital Stay (HOSPITAL_COMMUNITY): Payer: Medicaid Other | Admitting: Occupational Therapy

## 2016-05-18 ENCOUNTER — Inpatient Hospital Stay (HOSPITAL_COMMUNITY): Payer: Medicaid Other

## 2016-05-18 ENCOUNTER — Inpatient Hospital Stay (HOSPITAL_COMMUNITY): Payer: Self-pay | Admitting: Occupational Therapy

## 2016-05-18 NOTE — Progress Notes (Signed)
Occupational Therapy Session Note  Patient Details  Name: Edward HolmesJoseph G [name] MRN: [id number] Date of Birth: 06/04/1981  Today's Date: 05/18/2016 OT Individual Time: 1000-1100 OT Individual Time Calculation (min): 60 min    Short Term Goals: Week 2:  OT Short Term Goal 1 (Week 2): Pt will thread one UE into shirt during UB  dressing task with proper set-up and min A OT Short Term Goal 2 (Week 2): Pt will wash face using bath mit with min A OT Short Term Goal 3 (Week 2): Pt will complete sliding board transfer with max A x1 in prep for functional task OT Short Term Goal 4 (Week 2): Pt will roll with max A +1 in prep for functional bed level task  Skilled Therapeutic Interventions/Progress Updates:   ADL-retraining with emphasis on instruction to facilitate improved patient role of directing caregivers, re-ed on goals of treatment w/pt and family, performance of assisted bed mobility and mechanical transfer, and assisted dressing at bed level.   Pt and spouse educated on levels of assistance and week 2 goals (written and defined at consumer level).   Pt then participated in PROM at bilateral upper and lower extremities as spouse observed in prep for assisted bed mobility of one person with SBA from +2.   Pt noted with improved right shoulder and elbow flexion and extension during roll to his left, initiating turn and providing cues to caregiver for assist demonstrating good awareness and timing of procedures.   Pt also assumed role of directing caregiver throughout session while assisted with donning pants and shirt and performing bed mobility.   Extra time was required to perform mech lift transfer due recurrent moderate spasms (LE extension) and tremors, alternating from right LE to left LE with discomfort.    Pt left in tilt-in-space w/c at end of session with spouse attending to foot care.  Therapy Documentation  Precautions:  Precautionsh  Precautions: Fall, Cervical Precaution Comments: collar  for comfort ( Dr Ditty ordering soft collar 05/08/16) Cervical Brace: For comfort Restrictions Weight Bearing Restrictions: No   Pain: Pain Assessment Pain Assessment: 0-10 Pain Score: 2  Pain Type: Acute pain Pain Location: Neck Pain Descriptors / Indicators: Aching Pain Frequency: Intermittent Pain Onset: With Activity Pain Intervention(s): Medication (See eMAR)   ADL: ADL ADL Comments: See Functional Assessment Tool   See Function Navigator for Current Functional Status.   Therapy/Group: Individual Therapy  Lavaughn Haberle 05/18/2016, 12:59 PM
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
## 2016-05-18 NOTE — Progress Notes (Addendum)
Occupational Therapy Session Note  Patient Details  Name: Edward [name] MRN: [id number] Date of Birth: 1981-06-28  Today's Date: 05/18/2016 OT Individual Time: 1430-1535 OT Individual Time Calculation (min): 65 min    Short Term Goals: Week 1:  OT Short Term Goal 1 (Week 1): Patient will complete bed mobility, rolling to right and left with max assist X 1. OT Short Term Goal 1 - Progress (Week 1): Not met OT Short Term Goal 2 (Week 1): Pt will tolerate static sitting at raised mat with mod assist to maintain balance for 5 min OT Short Term Goal 2 - Progress (Week 1): Not met OT Short Term Goal 3 (Week 1): Pt will complete slide board transfer from EOB to w/c with max assist X 1 OT Short Term Goal 3 - Progress (Week 1): Not met OT Short Term Goal 4 (Week 1): Pt will demo ability to direct caregiver in PROM to maintain UE flexibility and reduce hypertonicity with supervision using written instructions provided OT Short Term Goal 4 - Progress (Week 1): Met OT Short Term Goal 5 (Week 1): Pt will lift hand to mouth with min assist in prep for self-feeding training using AE and orthotics prn. OT Short Term Goal 5 - Progress (Week 1): Progressing toward goal Week 2:  OT Short Term Goal 1 (Week 2): Pt will thread one UE into shirt during UB  dressing task with proper set-up and min Edward OT Short Term Goal 2 (Week 2): Pt will wash face using bath mit with min Edward OT Short Term Goal 3 (Week 2): Pt will complete sliding board transfer with max Edward x1 in prep for functional task OT Short Term Goal 4 (Week 2): Pt will roll with max Edward +1 in prep for functional bed level task    Skilled Therapeutic Interventions/Progress Updates:   Pt was received up in w/c with girlfriend Edward [name] present. Pt reported feeling very fatigued from therapy today and would rather do UE exercises instead of tilt table exercises. Pt was taken to outdoor environment and completed AAROM/PROM in all planes 4 sets with 30 second holds  in end-feel range. Pt able to assist with lowering limbs and initiate IR in bilateral UEs. Pt reported no pain during exercises. Trace movement in left wrist and digits and right digits noted today! Pt educated on meaningful mental rehearsal. Pt reported feeling that this helped with moving fingers. At end of session pt was returned to room and left sitting in TIS with Edward [name] present.    Therapy Documentation Precautions:  Precautions Precautions: Fall, Cervical Precaution Comments: collar for comfort ( Dr Ditty ordering soft collar 05/08/16) Required Braces or Orthoses: Cervical Brace Cervical Brace: For comfort Restrictions Weight Bearing Restrictions: No General:   Vital Signs: Therapy Vitals Temp: 98 F (36.7 C) Temp Source: Oral Pulse Rate: 71 Resp: 18 BP: 114/61 Patient Position (if appropriate): Sitting Oxygen Therapy SpO2: 98 % O2 Device: Not Delivered Pain: No c/o pain during session  Pain Assessment Pain Assessment: No/denies pain Pain Score: 2  Pain Location: Neck Pain Descriptors / Indicators: Aching Pain Frequency: Intermittent Pain Onset: With Activity Pain Intervention(s): Medication (See eMAR) ADL: ADL ADL Comments: See Functional Assessment Tool    See Function Navigator for Current Functional Status.   Therapy/Group: Individual Therapy  Edward [name] Edward [name] 05/18/2016, 4:04 PM

## 2016-05-18 NOTE — Progress Notes (Signed)
Occupational Therapy Session Note  Patient Details  Name: Edward [name] MRN: [id number] Date of Birth: 09/17/1980  Today's Date: 05/18/2016 OT Individual Time: 1302-1400 OT Individual Time Calculation (min): 58 min     Skilled Therapeutic Interventions/Progress Updates:    Completed LE stretching to glutes and hamstrings in supine to start session.  Pt with moderate tone noted in the knee extensors, resistant to stretch.  Also noted pt with spasms and shaking in both LEs as well.  Completed shoulder flexion exercises bilaterally for 1 set of 10 repetitions.  Noted increased activation of shoulder extensors when returning arm to his side on the right.  Increased tone in the internal rotators noted when attempting elbow flexion/extension.  Pt completed one set of 10 repetitions for each UE as well.  Min assist for guiding each UE for elbow flexion with min assist on the right and mod assist on the left for elbow extension.  Completed session with transfer to the tilt in space wheelchair with use of the [facility] as therapist did not have +2 assist for holding sliding board and pt's spasms/tone has increased.  Pt left in wheelchair with significant other present.   Therapy Documentation Precautions:  Precautions Precautions: Fall, Cervical Precaution Comments: collar for comfort ( Dr Ditty ordering soft collar 05/08/16) Required Braces or Orthoses: Cervical Brace Cervical Brace: For comfort Restrictions Weight Bearing Restrictions: No  Pain: Pain Assessment Pain Assessment: No/denies pain Pain Score: 2  Pain Location: Neck Pain Descriptors / Indicators: Aching Pain Frequency: Intermittent Pain Onset: With Activity Pain Intervention(s): Medication (See eMAR) ADL: See Function Navigator for Current Functional Status.   Therapy/Group: Individual Therapy  Shantrice Rodenberg OTR/L 05/18/2016, 3:47 PM

## 2016-05-19 ENCOUNTER — Inpatient Hospital Stay (HOSPITAL_COMMUNITY): Payer: Medicaid Other | Admitting: Physical Therapy

## 2016-05-19 ENCOUNTER — Inpatient Hospital Stay (HOSPITAL_COMMUNITY): Payer: Medicaid Other

## 2016-05-19 LAB — URINE CULTURE

## 2016-05-19 MED ORDER — NITROFURANTOIN MONOHYD MACRO 100 MG PO CAPS
100.0000 mg | ORAL_CAPSULE | Freq: Two times a day (BID) | ORAL | Status: AC
  Administered 2016-05-19 – 2016-05-26 (×14): 100 mg via ORAL
  Filled 2016-05-19 (×15): qty 1

## 2016-05-19 MED ORDER — BACLOFEN 20 MG PO TABS
20.0000 mg | ORAL_TABLET | Freq: Four times a day (QID) | ORAL | Status: DC
  Administered 2016-05-19 – 2016-06-15 (×108): 20 mg via ORAL
  Filled 2016-05-19 (×110): qty 1

## 2016-05-19 NOTE — Progress Notes (Signed)
Physical Therapy Session Note  Patient Details  Name: Edward [name] MRN: [id number] Date of Birth: 01/22/1981  Today's Date: 05/19/2016 PT Individual Time: 1419-1506 PT Individual Time Calculation (min): 47 min    Short Term Goals: Week 2:  PT Short Term Goal 1 (Week 2): Pt will perform transfer w/c <>mat/bed with transfer board and consistent maxA +1 PT Short Term Goal 2 (Week 2): Pt will perform bed mobility with maxA +1 PT Short Term Goal 3 (Week 2): Pt will perform static sitting balance with modA PT Short Term Goal 4 (Week 2): Pt will perform dynamic sitting balance with maxA  Skilled Therapeutic Interventions/Progress Updates:    Pt received in w/c & agreeable to tx, denying c/o pain. Utilized maximove to transfer pt w/c>tilt table. Pt tolerated tilt table for ~20 minutes with activity focusing on tolerating upright position & weightbearing through BLE. Viitals as follows:  Sitting in w/c: BP = 115/74 mmHg, HR = 62 bpm, SpO2 = 99% Tilt table at 20 degrees: BP = 106/71 mmHg, HR = 66 bpm, SpO2 = 97% 30 degrees: BP = 114/65 mmHg, HR = 64 bpm, SpO2 = 97% 40 degrees: BP = 118/71 mmHg, HR = 66 bpm, SpO2 = 98% 60 degrees at 1 minute: BP =  102/60 mmHg, HR = 72 bpm, SpO2 = 99% 60 degrees at 3 minutes: BP = 109/74 mmHg, HR = 71 bpm, SpO2 = 99%  While on tilt table in 60 degrees pt performed BUE elbow flexion focusing on concentric & eccentric control with assistance from therapist for overall movement. Pt reports LUE is stronger than RUE and he has more eccentric control in RUE than concentric. Encouraged pt to perform task on his own in room & pt voiced understanding. Educated pt on purpose of slowly transferring supine>standing in tilt table to allow body to physiologically adjust and purpose of weight bearing through BLE. At end of session pt transferred tilt table>w/c via maxi move +2 assist & required +2 assist to scoot buttocks back in w/c. Pt left in care of rehab tech who was  assisting pt back to room.  Pt declined wearing soft cervical collar during session but did require therapist support neck during transfers to prevent pain & strain on neck.   Therapy Documentation Precautions:  Precautions Precautions: Fall, Cervical Precaution Comments: collar for comfort ( Dr Ditty ordering soft collar 05/08/16) Required Braces or Orthoses: Cervical Brace Cervical Brace: For comfort Restrictions Weight Bearing Restrictions: No   See Function Navigator for Current Functional Status.   Therapy/Group: Individual Therapy  Edward [name] 05/19/2016, 4:50 PM

## 2016-05-19 NOTE — Progress Notes (Signed)
Occupational Therapy Session Note  Patient Details  Name: Edward [name] MRN: [id number] Date of Birth: 01/04/1981  Today's Date: 05/19/2016 OT Individual Time: 2952-84131130-1215 OT Individual Time Calculation (min): 45 min   Short Term Goals: Week 2:  OT Short Term Goal 1 (Week 2): Pt will thread one UE into shirt during UB  dressing task with proper set-up and min A OT Short Term Goal 2 (Week 2): Pt will wash face using bath mit with min A OT Short Term Goal 3 (Week 2): Pt will complete sliding board transfer with max A x1 in prep for functional task OT Short Term Goal 4 (Week 2): Pt will roll with max A +1 in prep for functional bed level task  Skilled Therapeutic Interventions/Progress Updates:   Therapeutic activity with focus on family education and training on assisted bed mobility, effective use of DME (lift and sling) and AE (Swedish Arm Help), and pt training on use of Swedish Arm Help to facilitate active movement of R-UE in prep for select functional task such as self-feeding or grooming task.   Pt's wife assisted with bed mobility and was educated on injury prevention and use of good body mechanics during assisted BADL.   Pt retained training from previous session and directed caregivers through all stages of mechanical lift transfer with min assist.   OT then educated pt and spouse on adjustments available using Swedish Arm Help with intent to allow pt to experience movement of R-UE in gravity eliminated plain.    Pt responded with active scapular depression and elevation, shoulder internal rotation and elbow flexion but lacked elbow extension or shoulder external rotation during initial trial.      Therapy Documentation Precautions:  Precautions Precautions: Fall, Cervical Precaution Comments: collar for comfort ( Dr Ditty ordering soft collar 05/08/16) Required Braces or Orthoses: Cervical Brace Cervical Brace: For comfort Restrictions Weight Bearing Restrictions: No   Pain:  No/denies pain  ADL: ADL ADL Comments: See Functional Assessment Tool   See Function Navigator for Current Functional Status.   Therapy/Group: Individual Therapy  [name] 05/19/2016, 12:42 PM
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
## 2016-05-19 NOTE — Plan of Care (Signed)
Problem: SCI BOWEL ELIMINATION Goal: RH STG SCI MANAGE BOWEL PROGRAM W/ASSIST OR AS APPROPRIATE STG SCI Manage bowel program w/ max assist or as appropriate.    Outcome: Progressing Pt on bowel program  Problem: RH PAIN MANAGEMENT Goal: RH STG PAIN MANAGED AT OR BELOW PT'S PAIN GOAL Pain less than 5 on a scale 0-10  Outcome: Not Progressing Pt is asking pain med every 3 hours. Pain ranges 3-4 q 4 hours

## 2016-05-20 ENCOUNTER — Inpatient Hospital Stay (HOSPITAL_COMMUNITY): Payer: Medicaid Other | Admitting: Occupational Therapy

## 2016-05-20 ENCOUNTER — Encounter (HOSPITAL_COMMUNITY): Payer: Self-pay | Admitting: *Deleted

## 2016-05-20 ENCOUNTER — Inpatient Hospital Stay (HOSPITAL_COMMUNITY): Payer: Self-pay | Admitting: Physical Therapy

## 2016-05-20 DIAGNOSIS — A499 Bacterial infection, unspecified: Secondary | ICD-10-CM

## 2016-05-20 DIAGNOSIS — N390 Urinary tract infection, site not specified: Secondary | ICD-10-CM

## 2016-05-20 MED ORDER — LIDOCAINE VISCOUS 2 % MT SOLN
15.0000 mL | OROMUCOSAL | Status: DC | PRN
  Filled 2016-05-20: qty 15

## 2016-05-20 MED ORDER — LIDOCAINE HCL 2 % EX GEL
1.0000 "application " | CUTANEOUS | Status: DC | PRN
  Administered 2016-05-24 – 2016-05-26 (×2): 1 via URETHRAL
  Filled 2016-05-20 (×8): qty 5
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
## 2016-05-20 NOTE — Progress Notes (Signed)
Occupational Therapy Session Note  Patient Details  Name: Edward [name] MRN: [id number] Date of Birth: [birth year]  Today's Date: 05/20/2016 OT Individual Time: 8676-7209 and 1300-1400 OT Individual Time Calculation (min): 70 min and 60 min    Short Term Goals:Week 2:  OT Short Term Goal 1 (Week 2): Pt will thread one UE into shirt during UB  dressing task with proper set-up and min A OT Short Term Goal 2 (Week 2): Pt will wash face using bath mit with min A OT Short Term Goal 3 (Week 2): Pt will complete sliding board transfer with max A x1 in prep for functional task OT Short Term Goal 4 (Week 2): Pt will roll with max A +1 in prep for functional bed level task  Skilled Therapeutic Interventions/Progress Updates:    Session One: Pt seen for OT session focusing on ADL re-training and increasing independence with ADLs. Pt in supine upon arrival with RN present, pt agreeable to tx session. He rolled with max A +2 to don new pants, with UE positioned, pt able to assist with rolling once elbow was hooked around bedrail. +2 max A for transfer to EOB and sliding board transfer into w/c.  Seated in w/c at sink, pt completed UB bathing using bath mit with steadying/ guiding assist at wrist and elbow. Pt with internal rotation and some ab/adduction to wash chest, however, fatigues quickly. Dorsal wrist support universal cuffs placed on pt for wrist support, pt with increased functional movements of UEs with wrist supported, will contact PA to order day time splint.  With proper set-up and min A, pt able to thread L UE into shirt hole, +2 assist required for positioning pt to pull shirt down entirely in back. Grooming completed at sink using L universal cuff, total A for cup management to rinse He was provided with weighted cup and elongated straw. Worked on Chief Executive Officer straw for pt to be able to use independently, set-up with pt able to obtain drink independently. Pt left seated in  tilt-in-space chair, family members present.   Session Two: Pt seen for OT session focusing on positioning in prep for functional task. Pt sitting up in tilt-in-space w/c upon arrival, agreeable to tx session. In therapy gym, +2 assist for sliding board transfer to therapy mat. Total A pt placed in long sitting position, required total A for sitting balance. Due to increased extensor tone, pt unable to come forward enough to obtain balance point. He was placed in modified circle sit positioning, placing one LE into position at a time for stretching of hip flexor. Educated regarding functional positioning of circle sitting for LB bathing/dressing in the future. Pt able to extend LEs back into long sitting position. In short sitting EOM, worked on pt obtaining and maintaining sitting balance EOM and on anterior/posterior pelvic tilt control. With towel placed around torso for therapist to assist with anterior weight shift, pt able to come forward to find balance point. He was able to lean R/L and return to midline with close supervision. He tolerated ~15 seconds of static sitting balance. Before LOB requiring total A to prevent LOB.  +2 assist to place sliding board and pt able to transfer back into w/c with max A +1, (pt assisting ~25%). He returned to room in w/c, left with all needs met and family members present.  Pt demonstrates difficulty isolating movement patterns due to excessive extensor tone whenever attempting movements. Tactile cuing and weightbearing use to assist with reducing tone/ functional  implication  Therapy Documentation Precautions:  Precautions Precautions: Fall, Cervical Precaution Comments: collar for comfort ( Dr Ditty ordering soft collar 05/08/16) Required Braces or Orthoses: Cervical Brace Cervical Brace: For comfort Restrictions Weight Bearing Restrictions: No Pain:   Not formally assessed, states "way better than it has been" ADL: ADL ADL Comments: See Functional  Assessment Tool  See Function Navigator for Current Functional Status.   Therapy/Group: Individual Therapy  [name], Nafeesa Dils C 05/20/2016, 7:14 AM

## 2016-05-20 NOTE — Progress Notes (Signed)
Physical Therapy Session Note  Patient Details  Name: Edward HolmesJoseph G Wishon MRN: [id number] Date of Birth: 05/06/1981  Today's Date: 05/20/2016 PT Individual Time: 1100-1200 and 1430-1530 PT Individual Time Calculation (min): 60 min and 60 min (total 120 min)    Short Term Goals: Week 2:  PT Short Term Goal 1 (Week 2): Pt will perform transfer w/c <>mat/bed with transfer board and consistent maxA +1 PT Short Term Goal 2 (Week 2): Pt will perform bed mobility with maxA +1 PT Short Term Goal 3 (Week 2): Pt will perform static sitting balance with modA PT Short Term Goal 4 (Week 2): Pt will perform dynamic sitting balance with maxA  Skilled Therapeutic Interventions/Progress Updates:   Tx 1: Pt received seated in w/c, c/o pain as below and agreeable to treatment. Transported to/from gym totalA. Transfer w/c <>mat table with transfer board and totalA to mat, maxA to return to chair with improving activation in BLEs to assist with scooting however continued need for significant assist for trunk flexion and anterior weight shift due to extensor tone. Sitting balance on edge of mat; able to demonstrate mild eccentric control when losing balance posteriorly when therapist's support removed, and assisted with UEs on edge of mat table, however unable to maintain static sitting >1-2 seconds at a time. Sit <>stand max/totalA from high perch position. Remaining trials performed with "three muskateers" assist from therapist and rehab tech. Once in standing, pt able to perform 10 reps mini-squats with modA +2, and noted difficulty keeping RLE heel on floor likely due to muscle length impairments. Sit <>supine +2 totalA. Hooklying trunk rotation with AAROM for LE management to improve core activation. Rolling onto L side with cues for pelvic/shoulder protraction and head position to initiate movement. Bridging 1x10 reps with AAROM, able to activate B glutes however poor clearance off table. Returned to w/c as above;  remained seated in w/c at end of session, lumbar roll in low back to postural support, all needs in reach and family present at end of session.   Tx 2: Pt received seated in w/c, c/o pain and agreeable to treatment. Standing frame x10 min with sling lowered for BLE quad/glute activation for mini-squats. BUE weight bearing on table and hands hooked on edge of table to assist with balance and pull to stand for BUE activation and anterior weight shift. Pt had incontinent bowel movement while in standing frame. Returned to bed with transfer board +2A. Sit <>supine +2 totalA. Rolling R/L with maxA with therapist placing LE in hooklying, and facilitation for scapular protraction, cues for head position to A with rolling; performed to allow therapist/RN to perform hygiene and donning/doffing brief totalA. Returned to w/c with +2 supine>sit and transfer with transfer board. Remained seated in w/c with towel roll in lumbar spine, all needs in reach at end of session.    Therapy Documentation Precautions:  Precautions Precautions: Fall, Cervical Precaution Comments: collar for comfort ( Dr Ditty ordering soft collar 05/08/16) Required Braces or Orthoses: Cervical Brace Cervical Brace: For comfort Restrictions Weight Bearing Restrictions: No Pain: Pain Assessment Pain Assessment: 0-10 Pain Score: 3  Pain Type: Chronic pain Pain Location: Neck Pain Orientation: Posterior Pain Descriptors / Indicators: Aching Pain Frequency: Constant Pain Onset: On-going Patients Stated Pain Goal: 3 Pain Intervention(s): Medication (See eMAR)   See Function Navigator for Current Functional Status.   Therapy/Group: Individual Therapy  Vista Lawmanlizabeth J Tygielski 05/20/2016, 12:44 PM

## 2016-05-21 ENCOUNTER — Inpatient Hospital Stay (HOSPITAL_COMMUNITY): Payer: Medicaid Other | Admitting: Occupational Therapy

## 2016-05-21 ENCOUNTER — Inpatient Hospital Stay (HOSPITAL_COMMUNITY): Payer: Self-pay | Admitting: Physical Therapy

## 2016-05-21 MED ORDER — FLEET ENEMA 7-19 GM/118ML RE ENEM
1.0000 | ENEMA | Freq: Every day | RECTAL | Status: DC
  Administered 2016-05-23 – 2016-05-25 (×3): 1 via RECTAL
  Filled 2016-05-21 (×6): qty 1

## 2016-05-21 MED ORDER — SALINE SPRAY 0.65 % NA SOLN
1.0000 | NASAL | Status: DC | PRN
  Filled 2016-05-21: qty 44

## 2016-05-21 NOTE — Progress Notes (Signed)
Occupational Therapy Session Note  Patient Details  Name: Edward [name] MRN: [id number] Date of Birth: 03/11/1981  Today's Date: 05/21/2016 OT Individual Time: 0900-1000 OT Individual Time Calculation (min): 60 min     Short Term Goals:Week 2:  OT Short Term Goal 1 (Week 2): Pt will thread one UE into shirt during UB  dressing task with proper set-up and min A OT Short Term Goal 2 (Week 2): Pt will wash face using bath mit with min A OT Short Term Goal 3 (Week 2): Pt will complete sliding board transfer with max A x1 in prep for functional task OT Short Term Goal 4 (Week 2): Pt will roll with max A +1 in prep for functional bed level task  Skilled Therapeutic Interventions/Progress Updates:    Pt seen for OT session focusing on ADL re-trainingand UE ROM. Pt insupine upon arrival with nursing staff presentcompleting boweland catheter program. Pt rolled with max A +1 to have pants pulled up. He transferred to EOB with +2 assist, max A sitting balance EOB. He transferred via sliding board with +2 to tilt-in-space. W/c. Completed UB bathing and grooming tasks from w/c level, see functional navigator for assist levels.     Pt able to direct care for set-up of mobile arm support for R UE ROM, he was unable to complete full elbow extension today, however able to complete full elbow flexion though lacks strength to internally rotate to bring hand to mouth in simulation of feeding task.   Pt left reclined in tilt-in-space w/c at end of session, call bell in reach, and all needs met.  Therapy Documentation Precautions:  Precautions Precautions: Fall, Cervical Precaution Comments: collar for comfort ( Dr Ditty ordering soft collar 05/08/16) Required Braces or Orthoses: Cervical Brace Cervical Brace: For comfort Restrictions Weight Bearing Restrictions: No ADL: ADL ADL Comments: See Functional Assessment Tool  See Function Navigator for Current Functional Status.   Therapy/Group:  Individual Therapy  [name], Shermeka Rutt C 05/21/2016, 6:37 AM
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
## 2016-05-21 NOTE — Progress Notes (Signed)
Physical Therapy Session Note  Patient Details  Name: Edward [name] MRN: [id number] Date of Birth: 10/22/1980  Today's Date: 05/21/2016 PT Individual Time: 1030-1130  And 1530-1600 PT Individual Time Calculation (min): 60 min and 30 min (total 90 min)    Short Term Goals: Week 2:  PT Short Term Goal 1 (Week 2): Pt will perform transfer w/c <>mat/bed with transfer board and consistent maxA +1 PT Short Term Goal 2 (Week 2): Pt will perform bed mobility with maxA +1 PT Short Term Goal 3 (Week 2): Pt will perform static sitting balance with modA PT Short Term Goal 4 (Week 2): Pt will perform dynamic sitting balance with maxA  Skilled Therapeutic Interventions/Progress Updates:   Tx 1: Pt received seated in w/c, c/o pain as below and agreeable to treatment. Transported to/from gym dependently in w/c. Transfer w/c <>mat table with transfer board and totalA +1 with improving pt anterior weight shift and assist with BLEs. Session with focus on dynamic sitting balance, core activation, anterior/lateral weight shifts, and postural correction to facilitate increased lumbar lordosis and thoracic extension. Performed AAROM BUE reaching with coordinated weight shifts to increase activation of core during functional task. Sit <.>stand x1 rep with +2A however pt with difficulty activating BLEs; reports increased LE soreness today and declines additional attempts at standing. Returned to room and remained semi-reclined in w/c at end of session. Discussed with pt importance of alerting staff for pressure relief when family not present; pt reports understanding.   Tx 2: Pt received seated in w/c, c/o pain as below and agreeable to treatment. Standing frame x20 min with stable vitals; min cues for trunk control and alignment, activation of B quads/glutes for NMR and to reduce pt hanging on sling. Pt performed AAROM BUE for functional reaching. 1" step placed under B feet for increased gastroc/soleus stretch in  standing. Returned to room in w/c, remained seated in w/c with nursing staff present at end of session.   Therapy Documentation Precautions:  Precautions Precautions: Fall, Cervical Precaution Comments: collar for comfort ( Dr Ditty ordering soft collar 05/08/16) Required Braces or Orthoses: Cervical Brace Cervical Brace: For comfort Restrictions Weight Bearing Restrictions: No Pain: Pain Assessment Pain Score: 2  Pain Type: Acute pain Pain Location: Neck Pain Descriptors / Indicators: Aching Pain Intervention(s): Medication (See eMAR)   See Function Navigator for Current Functional Status.   Therapy/Group: Individual Therapy  Vista Lawmanlizabeth J Tygielski 05/21/2016, 2:35 PM

## 2016-05-21 NOTE — Progress Notes (Signed)
Occupational Therapy Session Note  Patient Details  Name: Edward [name] MRN: [id number] Date of Birth: Mar 10, 1981  Today's Date: 05/21/2016 OT Individual Time: 1300-1430 OT Individual Time Calculation (min): 90 min     Short Term Goals:Week 1:  OT Short Term Goal 1 (Week 1): Patient will complete bed mobility, rolling to right and left with max assist X 1. OT Short Term Goal 1 - Progress (Week 1): Not met OT Short Term Goal 2 (Week 1): Pt will tolerate static sitting at raised mat with mod assist to maintain balance for 5 min OT Short Term Goal 2 - Progress (Week 1): Not met OT Short Term Goal 3 (Week 1): Pt will complete slide board transfer from EOB to w/c with max assist X 1 OT Short Term Goal 3 - Progress (Week 1): Not met OT Short Term Goal 4 (Week 1): Pt will demo ability to direct caregiver in PROM to maintain UE flexibility and reduce hypertonicity with supervision using written instructions provided OT Short Term Goal 4 - Progress (Week 1): Met OT Short Term Goal 5 (Week 1): Pt will lift hand to mouth with min assist in prep for self-feeding training using AE and orthotics prn. OT Short Term Goal 5 - Progress (Week 1): Progressing toward goal Week 2:  OT Short Term Goal 1 (Week 2): Pt will thread one UE into shirt during UB  dressing task with proper set-up and min A OT Short Term Goal 2 (Week 2): Pt will wash face using bath mit with min A OT Short Term Goal 3 (Week 2): Pt will complete sliding board transfer with max A x1 in prep for functional task OT Short Term Goal 4 (Week 2): Pt will roll with max A +1 in prep for functional bed level task  Skilled Therapeutic Interventions/Progress Updates:    Pt seen this session to address trunk control, pelvic mobilization, sitting balance, tone reduction, transfers.  Pt received in w/c with mom in room. Agreeable to treatment in the gym.  Pt worked on the following: Edward [name] +2 (2nd person holding board) to mat Sitting edge of  mat with large physioball behind back with active shoulder/scapular retraction with neck extension Lateral shifting hip to hip and forward back AROM of shoulders with arm on small ball to roll ball out with abd with lateral lean and then sh flexion with steadying A to control line of movement;      changed sides. L sh AROM>R Moved into supine with knees bent - pt worked on slow controlled movement of knees side to side with core strengthening and small pelvic tilts followed by initiation to bridge. Pt able to lift hips off mat 1 inch. Pt felt need to be cathed. Transferred back to w/c with board. Once in room, RN scanned pt's bladder and pt realized he needed to have a BM. With A from RN, slide board to bed mod A.  Pt completed BM. RN assisted with rolling for clean up and clothing change.  Pt requested to get back into w/c. Board back to w/c with mod A of 1. RN completed getting pt set up as therapy session was completed.    Therapy Documentation Precautions:  Precautions Precautions: Fall, Cervical Precaution Comments: collar for comfort ( Dr Ditty ordering soft collar 05/08/16) Required Braces or Orthoses: Cervical Brace Cervical Brace: For comfort Restrictions Weight Bearing Restrictions: No    Vital Signs: Therapy Vitals Temp: 97.8 F (36.6 C) Temp Source: Oral Pulse Rate: [name] Kitchen)  55 Resp: 12 BP: 108/64 Patient Position (if appropriate): Lying Oxygen Therapy SpO2: 98 % O2 Device: Nasal Cannula Pain: Pain Assessment Pain Assessment: 0-10 Pain Score: Asleep Pain Type: Acute pain;Surgical pain Pain Location: Back Pain Intervention(s): Medication (See eMAR) ADL: ADL ADL Comments: See Functional Assessment Tool  See Function Navigator for Current Functional Status.   Therapy/Group: Individual Therapy  Edward [name] 05/21/2016, 9:14 AM

## 2016-05-21 NOTE — Progress Notes (Signed)
Orthopedic Tech Progress Note Patient Details:  Edward HolmesJoseph G Landin 02/21/1981 [id number]  Ortho Devices Type of Ortho Device: Thumb velcro splint, Arm sling Ortho Device/Splint Location: Bilateral Thumb spica splints Ortho Device/Splint Interventions: Application   Saul FordyceJennifer C Jovana Rembold 05/21/2016, 3:54 PM

## 2016-05-22 ENCOUNTER — Inpatient Hospital Stay (HOSPITAL_COMMUNITY): Payer: Self-pay | Admitting: Physical Therapy

## 2016-05-22 ENCOUNTER — Inpatient Hospital Stay (HOSPITAL_COMMUNITY): Payer: Medicaid Other | Admitting: Occupational Therapy

## 2016-05-22 MED ORDER — MELATONIN 3 MG PO TABS
6.0000 mg | ORAL_TABLET | Freq: Every day | ORAL | Status: DC
  Administered 2016-05-22 – 2016-06-13 (×22): 6 mg via ORAL
  Filled 2016-05-22 (×24): qty 2

## 2016-05-22 NOTE — Progress Notes (Signed)
Physical Therapy Session Note  Patient Details  Name: Edward [name] MRN: [id number] Date of Birth: 09/03/1980  Today's Date: 05/22/2016 PT Individual Time: 1415-1545 PT Individual Time Calculation (min): 90 min    Short Term Goals: Week 2:  PT Short Term Goal 1 (Week 2): Pt will perform transfer w/c <>mat/bed with transfer board and consistent maxA +1 PT Short Term Goal 2 (Week 2): Pt will perform bed mobility with maxA +1 PT Short Term Goal 3 (Week 2): Pt will perform static sitting balance with modA PT Short Term Goal 4 (Week 2): Pt will perform dynamic sitting balance with maxA  Skilled Therapeutic Interventions/Progress Updates:   Pt received supine on mat table. BLE PROM to reduce tone and spasticity during mobility. Supine>sit with +2 log roll. Sit <>stand with three muskateers and max/totalA +2; performed mini-squats in standing with continued +2A however improving activation of B quad and glutes. Significant posterior lean likely due to extensor tone in trunk/LEs. Sit <>stand in stedy with B tibias resting on stedy LE pad and facilitation for anterior weight shift over feet. Rolling R/L with PNF combination of isotonics to facilitate trunk activation and scapular protraction. Returned to w/c with lateral scoot, maxA with pt initiating scooting as therapist assisted with anterior weight shift. Obtained standard w/c for pt to sit in and improve tolerance to upright, and tilt in space w/c with broken arm rest. Discussed with pt need to get into chair during next session with OT in 15 min; pt able to direct care to perform transfer with hoyer vs transfer board. Remained seated in w/c at end of session, all needs in reach and family/RN present.   Therapy Documentation Precautions:  Precautions Precautions: Fall, Cervical Precaution Comments: collar for comfort ( Dr Ditty ordering soft collar 05/08/16) Required Braces or Orthoses: Cervical Brace Cervical Brace: For  comfort Restrictions Weight Bearing Restrictions: No Pain: Pain Assessment Pain Score: 0-No pain   See Function Navigator for Current Functional Status.   Therapy/Group: Individual Therapy  Vista Lawmanlizabeth J Tygielski 05/22/2016, 4:11 PM
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
## 2016-05-22 NOTE — Progress Notes (Signed)
Patient A/O, no noted distress. Staff continue to assist patient with pain, by administering schedule and prn pain regimen. Writer tried encouraging and motivating, girlfriend to take part in In/Out cath. Girlfriend noted "I am hoping that he will get the sensation back, so I will not have to do and we are hoping he will get use of hands to do it. I just don't want to hurt him." Writer educated the girlfriend on the benefits of in and out cath, and the danger of not. It would be beneficial to include family members to participate in his care resulted in the girlfriend attentiveness. At the time of In and Out cath, girlfriend noted she did not feel well.  In and Out cath task was mastered by Writer and [place], sterile technique was maintained. Did not administer the enema resulted in am BM. Staff will continue to monitor, meet needs, and maintain safety.

## 2016-05-22 NOTE — Progress Notes (Signed)
Occupational Therapy Session Note  Patient Details  Name: Edward [name] MRN: [id number] Date of Birth: 02/01/1981  Today's Date: 05/22/2016 OT Individual Time: [id number] and 1300-1400 OT Individual Time Calculation (min): 75 min and 60 min    Short Term Goals:Week 2:  OT Short Term Goal 1 (Week 2): Pt will thread one UE into shirt during UB  dressing task with proper set-up and min A OT Short Term Goal 2 (Week 2): Pt will wash face using bath mit with min A OT Short Term Goal 3 (Week 2): Pt will complete sliding board transfer with max A x1 in prep for functional task OT Short Term Goal 4 (Week 2): Pt will roll with max A +1 in prep for functional bed level task  Skilled Therapeutic Interventions/Progress Updates:    Session One: Pt seen for OT session focusing on ADL re-training and UE ROM/strengthening. Pt in supine upon arrival, agreeable to tx session. He rolled with max A +1 to don pants, pt able to assist with moving shoulders in prep for rolling.  He transferred to EOB with +2 total A. Pt with increased extensor tone today, requiring total A to reposition LEs for safe positioning in prep for sliding board transfer. Seated in w/c, pt completed UB dressing and grooming tasks. Focus on using L UE at gross assist level, pt able to complete functional movements with L UE, however, fatigued quickly. Addressed UE ROM/strengthening exercises. Pt now with full elbow flexion against gravity, still requires assist for extension. Flicker of wrist movements on B sides, and pt with small finger (exception of thumb) movements in B UE, however, not yet at functional level.  Thumb spica splint donned with assist, educated regarding use and purpose of brace as well as to monitor for skin break down.  Pt left reclined in tilt in space chair at end of session, all needs in reach. [name] present for session, discussed w/c need at d/c, activity progression, and d/c planning.   Session Two:  Pt seen  for OT session focusing on UE ROM/ strengthening. Pt in tilt-in-space w/c upon arrival, agreeale to tx session. HE completed sliding board transfer with max A +1 with +2 to place  Board. Placed in supine on mat. Worked on UE ROM in gravity eliminated position utilizing hand skate on mat. With increased time, pt able to achieve full shoulder ABduction with return to midline with use of hand skate. Completed x 12 reps on each side. Completed same exercise with maxislide placed under pt's L UE, a bleto obtain full ROM with use of maxislide. Worked on pt sustaining position against gravity  in prep for internal/external rotation and with elbow flexion/extension. Completed on B sides, increased control L>R.  Pt desired to stay in supine in gym to await next PT session, supervision from other therapists provided.    Therapy Documentation Precautions:  Precautions Precautions: Fall, Cervical Precaution Comments: collar for comfort ( Dr Ditty ordering soft collar 05/08/16) Required Braces or Orthoses: Cervical Brace Cervical Brace: For comfort Restrictions Weight Bearing Restrictions: No ADL: ADL ADL Comments: See Functional Assessment Tool  See Function Navigator for Current Functional Status.   Therapy/Group: Individual Therapy  [name], Edward Lemler C 05/22/2016, 7:15 AM

## 2016-05-23 ENCOUNTER — Inpatient Hospital Stay (HOSPITAL_COMMUNITY): Payer: Self-pay | Admitting: Physical Therapy

## 2016-05-23 ENCOUNTER — Inpatient Hospital Stay (HOSPITAL_COMMUNITY): Payer: Self-pay | Admitting: Occupational Therapy

## 2016-05-23 ENCOUNTER — Ambulatory Visit (HOSPITAL_COMMUNITY): Payer: Self-pay | Admitting: Occupational Therapy

## 2016-05-23 LAB — BASIC METABOLIC PANEL
Anion gap: 8 (ref 5–15)
BUN: 18 mg/dL (ref 6–20)
CALCIUM: 9.2 mg/dL (ref 8.9–10.3)
CO2: 27 mmol/L (ref 22–32)
CREATININE: 0.86 mg/dL (ref 0.61–1.24)
Chloride: 104 mmol/L (ref 101–111)
GFR calc Af Amer: >60 mL/min (ref >60)
GFR calc non Af Amer: >60 mL/min (ref >60)
GLUCOSE: 109 mg/dL — AB (ref 65–99)
Potassium: 3.9 mmol/L (ref 3.5–5.1)
Sodium: 139 mmol/L (ref 135–145)

## 2016-05-23 LAB — CBC
HEMATOCRIT: 36.7 % — AB (ref 39.0–52.0)
Hemoglobin: 11.9 g/dL — ABNORMAL LOW (ref 13.0–17.0)
MCH: 30 pg (ref 26.0–34.0)
MCHC: 32.4 g/dL (ref 30.0–36.0)
MCV: 92.4 fL (ref 78.0–100.0)
Platelets: 357 10*3/uL (ref 150–400)
RBC: 3.97 MIL/uL — ABNORMAL LOW (ref 4.22–5.81)
RDW: 12.8 % (ref 11.5–15.5)
WBC: 10.8 10*3/uL — ABNORMAL HIGH (ref 4.0–10.5)
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
## 2016-05-23 NOTE — Progress Notes (Signed)
Physical Therapy Session Note  Patient Details  Name: Edward HolmesJoseph G Denley MRN: [id number] Date of Birth: 11/05/1980  Today's Date: 05/23/2016 PT Individual Time: 1515-1600 and 1030-1145 PT Individual Time Calculation (min): 45 min and 75 min (total 120 min)   and Today's Date: 05/23/2016 PT Co-Treatment Time: 1330-1400 PT Co-Treatment Time Calculation (min): 30 min    Short Term Goals: Week 2:  PT Short Term Goal 1 (Week 2): Pt will perform transfer w/c <>mat/bed with transfer board and consistent maxA +1 PT Short Term Goal 2 (Week 2): Pt will perform bed mobility with maxA +1 PT Short Term Goal 3 (Week 2): Pt will perform static sitting balance with modA PT Short Term Goal 4 (Week 2): Pt will perform dynamic sitting balance with maxA  Skilled Therapeutic Interventions/Progress Updates:   Tx 1: Pt received in w/c, c/o pain as below and agreeable to treatment. Transfer w/c <>nustep with totalA +2. Performed nustep with UE/LE assist devices to maintain grip and LE alignment. Performed on level 1 x7 min total, several rest breaks due to fatigue. Pt alternates primary driver of machine from UEs to LEs as extremities fatigue. W/c dumped for improved alignment, trunk support and head alignment for pt comfort and pressure relief. Theraband donned to BLE w/c rims to allow for improved grip for pt to self-propel. Pt able to place hands down beside wheels and move in small range for propulsion, however insufficient grip of shoulder adduction strength to move w/c without assist. Pt applies/removes brakes x3 during session on B sides with minA required for R side. Returned to room and remained seated in w/c at end of session, all needs in reach.   Tx 2: Skilled co-treat with OT for focus on functional mobility, LE/trunk ROM, weight bearing through BUEs. Performed sit >supine +2A. Rolling supine>prone with +2 totalA. In prone, pt propped on pillow and BUEs placed on elbows below upper body for BUE weight bearing,  trunk and cervical extensor strengthening, and lumbar extension ROM to reduce tone for carryover into pelvic mobility in sitting. Attempted to transition prone on elbows>quadruped, however limited by LE extensor tone. BLE PROM to hip flexors/quads in prone to reduce tone. BUE AROM in prone with maxislide under UEs to reduce friction; able to perform AAROM shoulder abduction/adduction with assist for managing forearm. Supine>sit on EOB with totalA +2, however pt initiating movement of LEs off EOB, limited by adduction spasticity. Returned to room in w/c; remained seated in w/c with all needs in reach at end of session.   Tx 3: Pt received seated in w/c, c/o pain as below and agreeable to treatment. Pt declines strenuous activity at this time due to excessive fatigue; requests to work on BUE function. NMES to BUE wrist extensors on NMES small muscle atrophy setting with on/off cycle 5/10 seconds. Use of mirror on contralateral UE to facilitate activation of arm without NMES on; unable to actively contact extensors. Pt performed brakes on/off x6 during session BUE with minA. Discussed with pt and pt's father importance of trying all activities as able before requesting assist to improve return, coordination and strength. Returned to room at end of session, all needs in reach.   Therapy Documentation Precautions:  Precautions Precautions: Fall, Cervical Precaution Comments: collar for comfort ( Dr Ditty ordering soft collar 05/08/16) Required Braces or Orthoses: Cervical Brace Cervical Brace: For comfort Restrictions Weight Bearing Restrictions: No Pain: Pain Assessment Pain Score: 2  Pain Intervention(s): Medication (See eMAR)   See Function Navigator for Current  Functional Status.   Therapy/Group: Individual Therapy and Co-Treatment  Vista Lawmanlizabeth J Tygielski 05/23/2016, 3:50 PM

## 2016-05-23 NOTE — Progress Notes (Signed)
Occupational Therapy Session Note  Patient Details  Name: Edward [name] MRN: [id number] Date of Birth: 02/21/1981  Today's Date: 05/23/2016 OT Individual Time: [id number] and 1300-13:15 OT Individual Time Calculation (min): 67 min and 15 min Co- tx with PT 1315-1400 (45 min total)    Short Term Goals:Week 2:  OT Short Term Goal 1 (Week 2): Pt will thread one UE into shirt during UB  dressing task with proper set-up and min A OT Short Term Goal 2 (Week 2): Pt will wash face using bath mit with min A OT Short Term Goal 3 (Week 2): Pt will complete sliding board transfer with max A x1 in prep for functional task OT Short Term Goal 4 (Week 2): Pt will roll with max A +1 in prep for functional bed level task  Skilled Therapeutic Interventions/Progress Updates:    Session One: Pt seen for OT session focusing on ADL re-training with dressing and self- feeding. Pt in supine upon arrival, agreeable to tx session.  Pants donned total A in supine. With bed flat, pt able to bridge to have pants pulled up. He rolled with max A +1, and transferred to EOB with +2 assist. +2 to place board and then max A +1 sliding board transfer into standard chair. Worked on doffing shirt, with shirt pulled overhead and pulled down to elbows, pt able to pull L UE out of shirt hole independently. He required min A to remove R UE from shirt. Pt able to assist and direct care for proper technique in donning new shirt.  Used dorsal wrist support universal cuff and plate guard to work on self feeding using L UE. Stabilizing assist required at wrist and elbow with min A to scoop food. Pt able to actively flex elbow to bring to mouth.  Dependent pressure relief completed by therapist, demonstrated  To pt's father how to complete. Pt reports his father will not be able to assist with this at d/c. Plan is for his children and mother to be able to assist with dependent pressure relief at d/c if need cont.  Cont to educate and  discuss d/c planning with pt and pt's father throughout session.   Session Two: Pt seen for OT session with part of session being co-tx with PT. Pt received in w/c, agreeable to tx session. Individual tx: Completed sliding board transfer to therapy mat, +2 present for safety. Required increased assist due to new w/c, now with greater dump in seat. Seated EOM, worked on functional sitting balance, requiring max- total A to find balance point, able to maintain with close supervision. Pt with fully flexed neck when sitting unsupported, pt with total posterior LOB when attempting to extend neck.  Co-tx with PT: Total A to place pt in prone. Worked on scapular retraction, cervical flexion/extention, glute squeezes, and shoulder ROM progressing to modified Army crawl from prone position. Pt tolerated position well, see PT note for remainder of session details.   Therapy Documentation Precautions:  Precautions Precautions: Fall, Cervical Precaution Comments: collar for comfort ( Dr Ditty ordering soft collar 05/08/16) Required Braces or Orthoses: Cervical Brace Cervical Brace: For comfort Restrictions Weight Bearing Restrictions: No ADL: ADL ADL Comments: See Functional Assessment Tool  See Function Navigator for Current Functional Status.   Therapy/Group: Individual Therapy  [name], Joanthan Hlavacek C 05/23/2016, 7:05 AM

## 2016-05-23 NOTE — Progress Notes (Signed)
Social Work Patient ID: Edward HolmesJoseph G [name], male   DOB: 10/14/1980, 35 y.o.   MRN: [id number]   Have reviewed team conference with pt and father (gf at work) and both aware that team continues to target d/c date of 11/17. Have explained that this date may change and pt very agreeable with extension of he can benefit from further progress.  No concerns or questions at this time.  They are aware that co-workers will be following up with them next week in my absence.  Baldomero Mirarchi, LCSW

## 2016-05-24 ENCOUNTER — Inpatient Hospital Stay (HOSPITAL_COMMUNITY): Payer: Self-pay | Admitting: Occupational Therapy

## 2016-05-24 ENCOUNTER — Inpatient Hospital Stay (HOSPITAL_COMMUNITY): Payer: Self-pay | Admitting: Physical Therapy

## 2016-05-24 DIAGNOSIS — N390 Urinary tract infection, site not specified: Secondary | ICD-10-CM

## 2016-05-24 DIAGNOSIS — B9620 Unspecified Escherichia coli [E. coli] as the cause of diseases classified elsewhere: Secondary | ICD-10-CM

## 2016-05-24 DIAGNOSIS — G8918 Other acute postprocedural pain: Secondary | ICD-10-CM

## 2016-05-24 DIAGNOSIS — D72820 Lymphocytosis (symptomatic): Secondary | ICD-10-CM
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
## 2016-05-24 NOTE — Progress Notes (Signed)
Physical Therapy Weekly Progress Note  Patient Details  Name: Edward [name] MRN: [id number] Date of Birth: [birth year]  Beginning of progress report period: May 16, 2016 End of progress report period: May 24, 2016  Today's Date: 05/24/2016 PT Individual Time: 1100-1200 and 1430-1530 PT Individual Time Calculation (min): 60 min and 60 min (total 120 min)    Patient has met 3 of 4 short term goals.  Currently requires maxA +1 for slideboard transfers with improving LE activation to A with hip clearance and scooting. maxA +2 for bed mobility supine <>sit due to extensor tone. Performing sit <>stand with maxA +2, and have attempted pre-gait activities, but again limited by extensor tone in LEs.   Patient continues to demonstrate the following deficits:  activity tolerance, balance, postural control, ability to compensate for deficits, functional use of  right upper extremity, right lower extremity, left upper extremity and left lower extremity and coordination and therefore will continue to benefit from skilled PT intervention to enhance overall performance with bed mobility, transfers, gait, w/c mobility to allow pt to improve independence with ADLs, home and community access.   Patient progressing toward long term goals..  Continue plan of care.  PT Short Term Goals Week 2:  PT Short Term Goal 1 (Week 2): Pt will perform transfer w/c <>mat/bed with transfer board and consistent maxA +1 PT Short Term Goal 2 (Week 2): Pt will perform bed mobility with maxA +1 PT Short Term Goal 3 (Week 2): Pt will perform static sitting balance with modA PT Short Term Goal 4 (Week 2): Pt will perform dynamic sitting balance with maxA Week 3:      Skilled Therapeutic Interventions/Progress Updates:   Tx 1: pt received supine in bed, c/o pain as below and agreeable to treatment. BLE PROM hip flexion, knee extension, hip ER to reduce tone and spasticity before performing mobility tasks. Supine>sit with  maxA for LE's off EOB, and pt attempted to pull to sit with BUEs; required +2A due to UE/core strength deficits in opposition to extensor tone. Squat pivot transfer maxA +2 with transfer board to w/c with pt activating BLEs for clearing hips over board. In w/c, pt performed BLE AROM knee extension for therapist to place leg rests, able to unlock w/c brakes with LUE, active assist for RUE. Transfer w/c <>mat table with maxA +1 with pt facilitated into anterior weight shift. Sitting balance on dynadisk facilitating lumbopelvic dissociation with trunk shortening/hip hiking; also performed with NDT facilitation for anterior/posterior pelvic tilt. Sit >stand with maxA with therapist blocking knees and facilitating anterior weight shift. Returned to w/c with maxA slide board transfer with pt activating BLEs for hip clearance/scooting on board. Remained seated in w/c at end of session, mother present and all needs in reach.   Tx 2: Pt received seated in w/c, c/o pain as below and agreeable to treatment. Lateral scoot transfer w/c <>mat table with maxA +1, therapist blocking B knees to allow pt to extend and A with hip clearance. Lateral leans R/L and weight bearing through elbow with min guard/S once achieved neutral position. BUE activities in sidelying and reclined on elbow including ball hits and zoomball for dynamic balance challenge and UE strengthening/coordination. Semi-reclined situps from stability ball with S and increasing distance for each rep to progressively increase demand. Educated pt's mother on tone, spasticity, pressure relief, and incomplete SCI course of recovery. Returned to tilt in space w/c; required adjustments of chair for appropriate fit, pressure relief and comfort. Remained seated  in reclined w/c at end of session, mother present and all needs in reach.   Therapy Documentation Precautions:  Precautions Precautions: Fall, Cervical Precaution Comments: collar for comfort ( Dr Ditty  ordering soft collar 05/08/16) Required Braces or Orthoses: Cervical Brace Cervical Brace: For comfort Restrictions Weight Bearing Restrictions: No General:   Pain: Pain Assessment Pain Assessment: Faces Pain Score: 2  Pain Type: Acute pain Pain Location: Neck Pain Orientation: Posterior Pain Descriptors / Indicators: Aching;Discomfort Pain Frequency: Intermittent Pain Onset: Gradual Patients Stated Pain Goal: 0 Pain Intervention(s): Medication (See eMAR)   See Function Navigator for Current Functional Status.  Therapy/Group: Individual Therapy  Luberta Mutter 05/24/2016, 1:09 PM

## 2016-05-24 NOTE — Progress Notes (Signed)
Occupational Therapy Weekly Progress Note  Patient Details  Name: Edward [name] MRN: [id number] Date of Birth: 10-24-1980  Beginning of progress report period: May 17, 2016 End of progress report period: May 24, 2016  Today's Date: 05/24/2016 OT Individual Time: 0900-1000 and 1300-1400 OT Individual Time Calculation (min): 60 min and 60 min    Patient has met 4 of 4 short term goals. Pt making slow but steady progress towards OT goals. Pt's neck pain now much better managed, able to tolerate more changes in position. He also demonstrated improved cervical stability/ strength to maintain head at neutral. He cont to be limited by exreme extensor tone as well as generalized weakness. Although pt with more motor return in B LEs and UEs, pt fatigues quickly and therefore UEs not yet at functional level.  Goals remain appropriate for now, however, may have to downgrade goals if progress continues at this speed.   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  Patient progressing toward long term goals..  Continue plan of care.  OT Short Term Goals Week 2:  OT Short Term Goal 1 (Week 2): Pt will thread one UE into shirt during UB  dressing task with proper set-up and min A OT Short Term Goal 1 - Progress (Week 2): Met OT Short Term Goal 2 (Week 2): Pt will wash face using bath mit with min A OT Short Term Goal 2 - Progress (Week 2): Met OT Short Term Goal 3 (Week 2): Pt will complete sliding board transfer with max A x1 in prep for functional task OT Short Term Goal 3 - Progress (Week 2): Met OT Short Term Goal 4 (Week 2): Pt will roll with max A +1 in prep for functional bed level task OT Short Term Goal 4 - Progress (Week 2): Met Week 3:  OT Short Term Goal 1 (Week 3): LB dressing to be completed with assist  +1 in order to decrease caregiver burden OT Short Term Goal 2 (Week 3): Pt will thread B UEs into shirt with set-up assist OT Short Term Goal 3 (Week 3): Will initiate toilet transfer to [facility] or padded tub bench    Skilled Therapeutic Interventions/Progress Updates:    Session One: Pt seen for OT ADL session focusing on bed mobility, functional positioning for ADL tasks, and dressing skills.  Pt in supine upon arrival, voicing need for nursing to complete cathing. Pt rolled with max A, with increased time and tactile cuing, pt able to initiate rolling for clothing management. Nursing staff completed cathing total A. Positioned pt sitting fully upright in bed and placed in modified circle sit position in prep for LB bathing/dressing. Required total A to position LEs, noted with increased extensor tone. Using bath mit, pt able to assist with washing B thighs, steadying assist required at wrist and elbow, pt able to assist with moving UE over LE to wash. Focus on pt using B UEs at gross stabilizer level to assist with threading and pulling pants up LEs. Returned to supine and pt able to complete modified bridge position for +2 to pull pants up. Pt left supine in bed at end of session to await PT. Soft call bell in reach and all needs met.   Session Two: Pt seen for OT session focusing on functional transfers and accessibility for cell phone. Pt sitting up in  w/c upon arrival with mother present, pt agreeable to tx session. Discussed d/c toileting plan for home, pt desiring to get up to [facility] if possible as part of bowel program. Completed maximove transfer w/c <> Bariatric BSC. Pt tolerated well and with pillow supported behind him pt able to maintain static sitting balance on BSC with supervision. Will attempt as part of bowel program next week.  Education and demonstration provided to pt and pt's mother of Harrel Lemon lift that he may go home with. Both appeared agreeable to lift for home. Also demonstrated  dependent pressure relief from standard w/c. Educated pt's mother on w/c parts and set-up of dependent pressure relief, pt able to assist with directing care. Pt's mother heistant on her ability to complete pressure relief. Had her attempt, able to do so with min-mod A with tilting pt. She was unable to maintain position for full two minutes of tilt, and voiced not being comfortable with doing that at d/c.  Pt received bluetooth device for phone accessibility for birthday present, desiring to have it set-up. Pt now able to receive phone calls and listen to music with hands free device. Pt returned to room, left sitting up in w/c with mom present, QRB donned for safety.    Therapy Documentation Precautions:  Precautions Precautions: Fall, Cervical Precaution Comments: collar for comfort ( Dr Ditty ordering soft collar 05/08/16) Required Braces or Orthoses: Cervical Brace Cervical Brace: For comfort Restrictions Weight Bearing Restrictions: No ADL: ADL ADL Comments: See Functional Assessment Tool  See Function Navigator for Current Functional Status.   Therapy/Group: Individual Therapy  [name], Nieve Rojero C 05/24/2016, 6:42 AM

## 2016-05-25 ENCOUNTER — Inpatient Hospital Stay (HOSPITAL_COMMUNITY): Payer: Medicaid Other | Admitting: *Deleted

## 2016-05-25 ENCOUNTER — Inpatient Hospital Stay (HOSPITAL_COMMUNITY): Payer: Self-pay | Admitting: Physical Therapy

## 2016-05-25 ENCOUNTER — Inpatient Hospital Stay (HOSPITAL_COMMUNITY): Payer: Self-pay | Admitting: Occupational Therapy

## 2016-05-25 DIAGNOSIS — S14101D Unspecified injury at C1 level of cervical spinal cord, subsequent encounter: Secondary | ICD-10-CM

## 2016-05-25 NOTE — Progress Notes (Signed)
Physical Therapy Session Note  Patient Details  Name: Edward [name] MRN: [id number] Date of Birth: 12/05/1980  Today's Date: 05/25/2016 PT Individual Time: 0800-0900 PT Individual Time Calculation (min): 60 min     Skilled Therapeutic Interventions/Progress Updates:  Patient in bed , initiated session with PNF patterns for B LE and UE in order to decrease tone and facilitate activation.  Rolling in bed and coming to  Sit with max A and max  assistance for transfer to w/c via sliding board.  In therapy gym patient transferred to mat with board max ATraining in sitting balance with and w/o manual support, patient not able to maintain sitting EOM for longer than 20-30 seconds, looses balance backward. Transfer back to w/c with sliding board -max A.  Patient returned to room with all needs within reach and girlfriend present.   Therapy Documentation Precautions:  Precautions Precautions: Fall, Cervical Precaution Comments: collar for comfort ( Dr Ditty ordering soft collar 05/08/16) Required Braces or Orthoses: Cervical Brace Cervical Brace: For comfort Restrictions Weight Bearing Restrictions: No   See Function Navigator for Current Functional Status.   Therapy/Group: Individual Therapy  Dorna MaiCzajkowska, Shaunee Mulkern W 05/25/2016, 12:42 PM

## 2016-05-25 NOTE — Progress Notes (Signed)
Occupational Therapy Session Note  Patient Details  Name: Edward [name] MRN: [id number] Date of Birth: 01-16-1981  Today's Date: 05/25/2016 OT Individual Time: 1115-1200 OT Individual Time Calculation (min): 45 min     Short Term Goals: Week 1:  OT Short Term Goal 1 (Week 1): Patient will complete bed mobility, rolling to right and left with max assist X 1. OT Short Term Goal 1 - Progress (Week 1): Not met OT Short Term Goal 2 (Week 1): Pt will tolerate static sitting at raised mat with mod assist to maintain balance for 5 min OT Short Term Goal 2 - Progress (Week 1): Not met OT Short Term Goal 3 (Week 1): Pt will complete slide board transfer from EOB to w/c with max assist X 1 OT Short Term Goal 3 - Progress (Week 1): Not met OT Short Term Goal 4 (Week 1): Pt will demo ability to direct caregiver in PROM to maintain UE flexibility and reduce hypertonicity with supervision using written instructions provided OT Short Term Goal 4 - Progress (Week 1): Met OT Short Term Goal 5 (Week 1): Pt will lift hand to mouth with min assist in prep for self-feeding training using AE and orthotics prn. OT Short Term Goal 5 - Progress (Week 1): Progressing toward goal  Skilled Therapeutic Interventions/Progress Updates:   1:1 NMR: focus on trunk control to come off the back of his w/c and then controlled posterior lean back into chair. Bilateral UE functional mobility with min A with focus on shoulder, bicep, tricep, supination, pronation, finger flexion and wrist deviation. Pt with no active flexion/ extension of wrist but does have some radial deviation and can supinate/ pronate (left >right) in gravity eliminated plane. Focus on grasp and release of cup and bringing empty cup up to chin with min  A for assist against gravity.  Self care retraining at sink with focus on donning deodorant with same method we did in the gym (grasp and release and shoulder movement), doff and donning of shirt with modified  techniques and brushing teeth with elbow stabilization and dorsal hand splints.   Therapy Documentation Precautions:  Precautions Precautions: Fall, Cervical Precaution Comments: collar for comfort ( Dr Ditty ordering soft collar 05/08/16) Required Braces or Orthoses: Cervical Brace Cervical Brace: For comfort Restrictions Weight Bearing Restrictions: No Pain:  no c/o pain ADL: ADL ADL Comments: See Functional Assessment Tool Exercises:   Other Treatments:    See Function Navigator for Current Functional Status.   Therapy/Group: Individual Therapy  Willeen Cass [facility] 05/25/2016, 2:36 PM

## 2016-05-25 NOTE — Progress Notes (Signed)
Physical Therapy Session Note  Patient Details  Name: Edward [name] MRN: [id number] Date of Birth: 05/15/1981  Today's Date: 05/25/2016 PT Individual Time: 1515-1550 PT Individual Time Calculation (min): 35 min    Short Term Goals: Week 2:  PT Short Term Goal 1 (Week 2): Pt will perform transfer w/c <>mat/bed with transfer board and consistent maxA +1 PT Short Term Goal 2 (Week 2): Pt will perform bed mobility with maxA +1 PT Short Term Goal 3 (Week 2): Pt will perform static sitting balance with modA PT Short Term Goal 4 (Week 2): Pt will perform dynamic sitting balance with maxA  Skilled Therapeutic Interventions/Progress Updates:     Patient received sitting in TIS WC and agreeable to PT. Per patient request, session focused on improved use of BUE for functional grapsing. PT educated patient in tenodesis grip and proper stretching to encourage use of wrist extension to initiate grasp. Fine motor control training for finger fleixon/extension with emphasis on finger extension. PT educated patient in need to stretch fingers, but maintain neutral and flexed wrist position to prevent contracture.   Patient returned to room and [facility] with call bell in lap.   Therapy Documentation Precautions:  Precautions Precautions: Fall, Cervical Precaution Comments: collar for comfort ( Dr Ditty ordering soft collar 05/08/16) Required Braces or Orthoses: Cervical Brace Cervical Brace: For comfort Restrictions Weight Bearing Restrictions: No General:   Vital Signs: Therapy Vitals Temp: 98.1 F (36.7 C) Temp Source: Oral Pulse Rate: 65 Resp: 18 BP: 121/67 Patient Position (if appropriate): Sitting Oxygen Therapy SpO2: 100 % O2 Device: Not Delivered Pain: Pain Assessment Pain Assessment: 0-10 Pain Score: 2  Pain Type: Acute pain Pain Location: Neck Pain Orientation: Right;Left Pain Frequency: Intermittent Pain Onset: With Activity Pain Intervention(s): Medication (See  eMAR)   See Function Navigator for Current Functional Status.   Therapy/Group: Individual Therapy  Golden Popustin E Donnivan Villena 05/25/2016, 5:41 PM
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
## 2016-05-26 ENCOUNTER — Inpatient Hospital Stay (HOSPITAL_COMMUNITY): Payer: Medicaid Other | Admitting: Occupational Therapy

## 2016-05-26 NOTE — Progress Notes (Signed)
Occupational Therapy Session Note  Patient Details  Name: Edward [name] MRN: [id number] Date of Birth: 05/06/1981  Today's Date: 05/26/2016 OT Individual Time: 1110-1220 OT Individual Time Calculation (min): 70 min   Short Term Goals: Week 3:  OT Short Term Goal 1 (Week 3): LB dressing to be completed with assist +1 in order to decrease caregiver burden OT Short Term Goal 2 (Week 3): Pt will thread B UEs into shirt with set-up assist OT Short Term Goal 3 (Week 3): [facility]   Skilled Therapeutic Interventions/Progress Updates:  Patient participated in skilled OT today as follows:  Supine to Edge of bed= Total A x2,             EOB static sitting balance initially Max A x2 and improved to max A x1   Tolerated physical facilitation/assist for anterior/posterior pelvic tilts,        Trunk extension with Max A x 2  Patient was able to bring neck into neutral position and hold for about 5 seconds x 4 tries - after that he stated, he was too physically tired to maintain or do more movements  Transferred bed to high back tilt wheel chair with Total A x 2,    AAROM and PROM of all bilateral upper extremity joints- patient exhibited some purposeful movement in left upper extremity before becoming fatigued  Patient was left in sitting in his high back wheel chair with fiance, 36 year old dtr and 36 year old son supportively standing by to help  Therapy Documentation Precautions:  Precautions Precautions: Fall, Cervical Precaution Comments: collar for comfort ( Dr Ditty ordering soft collar 05/08/16) Required Braces or Orthoses: Cervical Brace Cervical Brace: For comfort Restrictions Weight Bearing Restrictions: No  Pain:denied  See Function Navigator for Current Functional Status.   Therapy/Group: Individual Therapy  [name], [name] [name] LLC[facility] 05/26/2016, 4:27 PM
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
## 2016-05-27 ENCOUNTER — Inpatient Hospital Stay (HOSPITAL_COMMUNITY): Payer: Self-pay | Admitting: Physical Therapy

## 2016-05-27 ENCOUNTER — Inpatient Hospital Stay (HOSPITAL_COMMUNITY): Payer: Medicaid Other

## 2016-05-27 ENCOUNTER — Inpatient Hospital Stay (HOSPITAL_COMMUNITY): Payer: Self-pay | Admitting: Occupational Therapy

## 2016-05-27 LAB — CBC WITH DIFFERENTIAL/PLATELET
Basophils Absolute: 0 10*3/uL (ref 0.0–0.1)
Basophils Relative: 0 %
EOS ABS: 0.4 10*3/uL (ref 0.0–0.7)
Eosinophils Relative: 3 %
HEMATOCRIT: 38.1 % — AB (ref 39.0–52.0)
HEMOGLOBIN: 12.2 g/dL — AB (ref 13.0–17.0)
LYMPHS ABS: 1.7 10*3/uL (ref 0.7–4.0)
LYMPHS PCT: 16 %
MCH: 29.8 pg (ref 26.0–34.0)
MCHC: 32 g/dL (ref 30.0–36.0)
MCV: 93.2 fL (ref 78.0–100.0)
MONOS PCT: 7 %
Monocytes Absolute: 0.8 10*3/uL (ref 0.1–1.0)
NEUTROS ABS: 7.7 10*3/uL (ref 1.7–7.7)
NEUTROS PCT: 74 %
Platelets: 331 10*3/uL (ref 150–400)
RBC: 4.09 MIL/uL — ABNORMAL LOW (ref 4.22–5.81)
RDW: 12.4 % (ref 11.5–15.5)
WBC: 10.6 10*3/uL — ABNORMAL HIGH (ref 4.0–10.5)

## 2016-05-27 LAB — BASIC METABOLIC PANEL
Anion gap: 9 (ref 5–15)
BUN: 18 mg/dL (ref 6–20)
CHLORIDE: 107 mmol/L (ref 101–111)
CO2: 27 mmol/L (ref 22–32)
CREATININE: 0.88 mg/dL (ref 0.61–1.24)
Calcium: 9.5 mg/dL (ref 8.9–10.3)
GFR calc non Af Amer: >60 mL/min (ref >60)
Glucose, Bld: 123 mg/dL — ABNORMAL HIGH (ref 65–99)
POTASSIUM: 4 mmol/L (ref 3.5–5.1)
Sodium: 143 mmol/L (ref 135–145)

## 2016-05-27 MED ORDER — BISACODYL 10 MG RE SUPP
10.0000 mg | Freq: Every day | RECTAL | Status: DC
  Administered 2016-05-28 – 2016-05-30 (×3): 10 mg via RECTAL
  Filled 2016-05-27 (×3): qty 1

## 2016-05-27 NOTE — Progress Notes (Signed)
Occupational Therapy Session Note  Patient Details  Name: Edward HolmesJoseph G [name] MRN: [id number] Date of Birth: 10/02/1980  Today's Date: 05/27/2016 OT Individual Time: 0830-1000 OT Individual Time Calculation (min): 90 min     Short Term Goals:Week 3:  OT Short Term Goal 1 (Week 3): LB dressing to be completed with assist +1 in order to decrease caregiver burden OT Short Term Goal 2 (Week 3): Pt will thread B UEs into shirt with set-up assist OT Short Term Goal 3 (Week 3): [facility]   Skilled Therapeutic Interventions/Progress Updates:    Pt seen for OT ADL bathing/dressing session. Pt in supine upon arrival with RN present. Discussed with pt and RN bowel program and [facility] now [facility] bowel program. Reviewed recommendations and importance of pt directing care with change of nursing staff assisting him, pt voiced understanding. Pt transferred via maximove to roll in shower chair. He bathed with total A sitting on roll in shower chair, safety belt donned and theraband placed around LEs for tone control/ positioning.  He required total A for bathing all parts. Attempted to have pt use bath mit to bathe chest, however, when pt attempting to use UEs, LE extensor tone would kick in and make for unsafe positioning in shower chair, therefore, total A provided. He returned to bed via lift and LB dressing completed total A +2 in bed. +2 transfer to EOB and sliding board transfer into standard w/c. UB dressing completed in chair, pt able to thread B UEs into shirt, using R UE to assist with threading L arm up to elbow. Still required +2 to pull over head. Pt left sitting in w/c at end of session, QRB donned, and pt's father present.   Therapy Documentation Precautions:  Precautions Precautions: Fall, Cervical Precaution Comments: collar for comfort ( Dr Ditty ordering soft collar 05/08/16) Required Braces or Orthoses: Cervical  Brace Cervical Brace: For comfort Restrictions Weight Bearing Restrictions: No Pain: Pain Assessment Pain Assessment: 0-10 Pain Score: 2  Pain Type: Acute pain;Surgical pain Pain Location: Neck Pain Orientation: Right;Left Pain Descriptors / Indicators: Aching Pain Frequency: Intermittent Pain Onset: With Activity Pain Intervention(s): Shower, repositioned ADL: ADL ADL Comments: See Functional Assessment Tool  See Function Navigator for Current Functional Status.   Therapy/Group: Individual Therapy  [name], Landi Biscardi C 05/27/2016, 7:08 AM

## 2016-05-27 NOTE — Progress Notes (Signed)
Physical Therapy Session Note  Patient Details  Name: Edward [name] MRN: [id number] Date of Birth: 03/14/1981  Today's Date: 05/27/2016 PT Individual Time: 1015-1045 PT Individual Time Calculation (min): 30 min    Short Term Goals: Week 3:  PT Short Term Goal 1 (Week 3): Pt will perform transfer with modA +1 PT Short Term Goal 2 (Week 3): Pt will perform bed mobility maxA +1 PT Short Term Goal 3 (Week 3): Pt will perform w/c propulsion maxA +1 PT Short Term Goal 4 (Week 3): Pt will perform dynamic sitting balance with min guard x10 min  Skilled Therapeutic Interventions/Progress Updates:    Session focused on neuro re-ed to address motor control/activation and coordination of BUE and BLE during various UE fine and gross motor tasks (cup manipulation, holding a ball while performing elbow flexion, and volleyball bumps with active assisted support by PT) and for BLE to kick a ball from seated position. Focusing on functional movement patterns and grasp in bilateral hands. Pt able to manipulate brakes on w/c 75% of the time. [name] present and engaging in therapy session.    Therapy Documentation Precautions:  Precautions Precautions: Fall, Cervical Precaution Comments: collar for comfort ( Dr Ditty ordering soft collar 05/08/16) Required Braces or Orthoses: Cervical Brace Cervical Brace: For comfort Restrictions Weight Bearing Restrictions: No    Pain: Denies pain.    See Function Navigator for Current Functional Status.   Therapy/Group: Individual Therapy  Edward [name], Edward [name]  Alanni Vader B. Phong Isenberg, PT, DPT  05/27/2016, 11:02 AM

## 2016-05-27 NOTE — Progress Notes (Signed)
Physical Therapy Session Note  Patient Details  Name: Edward HolmesJoseph G Bloodgood MRN: [id number] Date of Birth: 01/16/1981  Today's Date: 05/27/2016 PT Individual Time: 1100-1210 and 1435-1535 PT Individual Time Calculation (min): 70 min and 60 min (total 130 min)    Short Term Goals: Week 3:  PT Short Term Goal 1 (Week 3): Pt will perform transfer with modA +1 PT Short Term Goal 2 (Week 3): Pt will perform bed mobility maxA +1 PT Short Term Goal 3 (Week 3): Pt will perform w/c propulsion maxA +1 PT Short Term Goal 4 (Week 3): Pt will perform dynamic sitting balance with min guard x10 min  Skilled Therapeutic Interventions/Progress Updates:   Tx 1: Pt received seated in w/c, c/o pain as below and agreeable to treatment. Transfer w/c >mat table with transfer board and maxA with facilitation for anterior weight shift and pt pushing through BLEs. BLE AFO's Sit <>stand from mat table with +2A "three muskateers" and note improving anterior weight shift and LE activation. Once in standing, max tactile cues for upright posture, glute/quad activation for trunk extension. Sit <>stand x3 trials with RW and BUE hand splints and modA +2 for transition to stand. Once in standing, mirror and tactile cues for anterior weight shifts pt able to stand with close S. Yoga block between feet to prevent narrow BOS while performing AAROM forward/backwards steps and lateral weight shifts. Returned to w/c with transfer board and RW with BUEs in hand splints to facilitate anterior weight shift and improve push through LEs. Remained seated in w/c at end of session, reclined and AFOs intact for PROM soleus while resting in chair; all needs in reach.  Tx 2: Pt received seated in w/c, denies pain and agreeable to treatment. Transfer w/c <>mat table with transfer board and maxA. Sit >supine with log roll technique and assist to maintain RUE elbow weight bearing/balance while LEs elevated onto mat table totalA. BLE AAROM heel slides,  bridging, for carryover into bed mobility using maxislide. Rolling R/L x3 each direction with min/modA and assist for placing LE into hook lying. All mobility significantly limited by extensor tone and spasticity. Returned to w/c as above; remained seated in w/c at end of session, all needs in reach.   Therapy Documentation Precautions:  Precautions Precautions: Fall, Cervical Precaution Comments: collar for comfort ( Dr Ditty ordering soft collar 05/08/16) Required Braces or Orthoses: Cervical Brace Cervical Brace: For comfort Restrictions Weight Bearing Restrictions: No Pain: Pain Assessment Pain Assessment: 0-10 Pain Score: 2  Pain Type: Acute pain;Surgical pain Pain Location: Neck Pain Orientation: Right;Left Pain Descriptors / Indicators: Aching Pain Frequency: Intermittent Pain Onset: With Activity Pain Intervention(s): Medication (See eMAR)   See Function Navigator for Current Functional Status.   Therapy/Group: Individual Therapy  Vista Lawmanlizabeth J Tygielski 05/27/2016, 12:12 PM

## 2016-05-27 NOTE — Progress Notes (Signed)
Patient refuses his bowel program yesterday and today and said he has been having multiple BM and he was educated. We continue to monitor.
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
## 2016-05-28 ENCOUNTER — Inpatient Hospital Stay (HOSPITAL_COMMUNITY): Payer: Self-pay | Admitting: Physical Therapy

## 2016-05-28 ENCOUNTER — Inpatient Hospital Stay (HOSPITAL_COMMUNITY): Payer: Medicaid Other | Admitting: Occupational Therapy

## 2016-05-28 MED ORDER — TIZANIDINE HCL 2 MG PO TABS
2.0000 mg | ORAL_TABLET | Freq: Two times a day (BID) | ORAL | Status: DC
  Administered 2016-05-28 – 2016-06-04 (×15): 2 mg via ORAL
  Filled 2016-05-28 (×15): qty 1

## 2016-05-28 NOTE — Progress Notes (Signed)
Occupational Therapy Session Note  Patient Details  Name: Edward [name] MRN: [id number] Date of Birth: 01/03/1981  Today's Date: 05/28/2016  OT Individual Time: 0845-1000 and 1330-1400 OT Individual Time Calculation (min): 75 min and 30 min    Short Term Goals:Week 3:  OT Short Term Goal 1 (Week 3): LB dressing to be completed with assist +1 in order to decrease caregiver burden OT Short Term Goal 2 (Week 3): Pt will thread B UEs into shirt with set-up assist OT Short Term Goal 3 (Week 3): Will initiate toilet transfer to BSC or padded tub bench   Skilled Therapeutic Interventions/Progress Updates:    Session One: Pt seen for OT session focusing on sit <> stand with LB dressing. Pt in supine upon arrival, agreeable to tx session. Voiced increased tone this morning and requested therapist to assist with stretching. Stretches performed from supine level with success for decreasing tone. He transferred to EOB with +2 assist. From EOB, pt completed sit <> stand at RW x2 to have pants pulled down and then second trial to pull new pants up. Pt's knees hyperextended furing standing, required physical assist to "unlock" knee for pt to sit.  Pt's hands strapped to RW hand splints prior to transfer. He required min A to place hands into splints, able to bring hands into laps independently following stand. Pt declined grooming tasks this morning.  Completed UE strengthening/ ROM against gravity. Completed bicep curls against gravity, completed x7 with L UE (wrist supported in dorsal wrist supports) and x4 on R UE before fatigue. Addressed isloated finger movements in R hand. Pt wtith active flexion in radial nerve distribution digits, unable to extend however. He has trace movements in ulnar nerve distribution area. Pt left tilted in w/c at end of session, softcall bell in reach and all needs met. He refused wearing seat belt in chair. Reviewed need for next boosting time and RN aware.  Pt with first  continent BM on BSC this morning!  Session Two: Pt seen for OT session focusing on stretching and tasks in prep for bed mobility. Pt in prone upon arrival to therapy gym with hand off from PT. Pt desiring to stay in prone to cont to stretch. UEs positioned in full extension overhead for further stretch. Light message with use of tennis ball rolled on pt's lower back provided with comfort.  Addressed rolling on mat, with pt in supine and knee elevated/ flexed, pt able to come into R sidelying with close supervision- min A with VCs for technique.  Mod A +1 sliding board transfer completed to w/c with down sloped board into chair.  Pt returned to room and left with all needs met and mother present.  Cont education and discussion with pt and pt's mom regarding d/c planning and OT/PT goals.   Therapy Documentation Precautions:  Precautions Precautions: Fall, Cervical Precaution Comments: collar for comfort ( Dr Ditty ordering soft collar 05/08/16) Required Braces or Orthoses: Cervical Brace Cervical Brace: For comfort Restrictions Weight Bearing Restrictions: No Pain: Pain Assessment Pain Score: 3  ADL: ADL ADL Comments: See Functional Assessment Tool  See Function Navigator for Current Functional Status.   Therapy/Group: Individual Therapy  [name],  C 05/28/2016, 7:07 AM 
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
## 2016-05-28 NOTE — Progress Notes (Signed)
Physical Therapy Session Note  Patient Details  Name: Edward [name] MRN: [id number] Date of Birth: 08/03/1980  Today's Date: 05/28/2016 PT Individual Time: 1100-1200 and 1300-1330 and 1430-1530 PT Individual Time Calculation (min): 60 min and 30 min and and 60 min (total 150 min)     Short Term Goals: Week 3:  PT Short Term Goal 1 (Week 3): Pt will perform transfer with modA +1 PT Short Term Goal 2 (Week 3): Pt will perform bed mobility maxA +1 PT Short Term Goal 3 (Week 3): Pt will perform w/c propulsion maxA +1 PT Short Term Goal 4 (Week 3): Pt will perform dynamic sitting balance with min guard x10 min  Skilled Therapeutic Interventions/Progress Updates:   Tx 1: Pt received seated in w/c, c/o pain as below and agreeable to treatment. Transfer w/c <> mat table with transfer board and maxA. Sit <>stand with modA +2 with BUE in hand splints in RW. Standing balance with visual feedback from mirror, tactile cues from therapist for anterior weight shift to reduce reliance of LEs on table. Poor anterior translation of tibias over feet during sit >stand. Long sitting +2 to attain position with back support against wall. Performed BUE reaching AAROM, and BUE fine motor control with velcro ring pieces to play tick tac toe on velcro board in lap. Focus on trunk control, weight shifting BLE PROM hamstrings. Returned to w/c as above; remained seated in w/c with mother present and all needs in reach.   Tx 2: Pt received seated in w/c, denies pain and agreeable to treatment. Transfer w/c <>mat table with transfer board and maxA. Sit <>stand x3 trials from elevated mat table with BUE in hand splints on RW. Max verbal/tactile cues for anterior weight shift due to plantarflexor spasticity. Sit >supine with logroll and +2A; improving transition sit>sidelying propped on elbow. Supine>L sidelying with RLE AAROM to position in hooklying, then minA to roll onto side. totalA +2 to transition sidelying>prone with  pillow under stomach. BLE PROM hip flexor/quads 2x30 sec each. Prone on elbows with modA to achieve position from lying flat in prone. 10 reps cervical extension with scapular protraction. Handoff to OT at end of session.   Tx 3: pt received seated in w/c with no c/o pain and agreeable to treatment. Transfer w/c <>mat table with maxA +1 with therapist facilitating anterior weight shift and pt activating BLEs to clear hips and scoot. Sit <>stand in stedy x5 trials from mat table initially, then from stedy seat. Pt reports he is fearful of weight shifting forward due to fear of falling forward over stedy bar. Performed alternating UE reaching to therapist hand as target for functional UE reaching, fine motor control of gripping/releasing stedy rail and to facilitate anterior weight shifting. Transfer w/c <>nustep totalA +2 for energy conservation. Performed Nustep 6 min total with BUE/BLE assist to maintain grip and LE alignment. Limited by increased clonus likely due to fatigue. Returned to room in w/c and remained seated in w/c at end of session, all needs in reach.   Therapy Documentation Precautions:  Precautions Precautions: Fall, Cervical Precaution Comments: collar for comfort ( Dr Ditty ordering soft collar 05/08/16) Required Braces or Orthoses: Cervical Brace Cervical Brace: For comfort Restrictions Weight Bearing Restrictions: No Pain: Pain Assessment Pain Score: 3  Pain Type: Acute pain Pain Location: Neck Pain Descriptors / Indicators: Discomfort Pain Intervention(s): Medication (See eMAR)   See Function Navigator for Current Functional Status.   Therapy/Group: Individual Therapy  Edward [name] 05/28/2016, 11:51  AM  

## 2016-05-29 ENCOUNTER — Inpatient Hospital Stay (HOSPITAL_COMMUNITY): Payer: Medicaid Other | Admitting: Occupational Therapy

## 2016-05-29 ENCOUNTER — Inpatient Hospital Stay (HOSPITAL_COMMUNITY): Payer: Medicaid Other

## 2016-05-29 ENCOUNTER — Ambulatory Visit (HOSPITAL_COMMUNITY): Payer: Self-pay | Admitting: *Deleted

## 2016-05-29 MED ORDER — CIPROFLOXACIN HCL 250 MG PO TABS
250.0000 mg | ORAL_TABLET | Freq: Two times a day (BID) | ORAL | Status: AC
  Administered 2016-05-29 – 2016-06-03 (×12): 250 mg via ORAL
  Filled 2016-05-29 (×13): qty 1

## 2016-05-29 NOTE — Progress Notes (Signed)
Recreational Therapy Session Note  Patient Details  Name: Edward [name] MRN: [id number] Date of Birth: 07/17/1981 Today's Date: 05/29/2016  Pain: no c/o  Pt participated in animal assisted activity/therapy seated w/c level using RUE with supervision-min assist and mod I using LUE.  [name] 05/29/2016, 2:57 PM

## 2016-05-29 NOTE — Progress Notes (Signed)
Physical Therapy Note  Patient Details  Name: Edward [name] MRN: [id number] Date of Birth: 01/11/1981 Today's Date: 05/29/2016  1510-1540, 30 min individual tx Pain: none reported  Session focused on therapeutic activities In sitting in w/c to challenge balance and use of UEs functionally.  With w/c at 10 degrees tilt, pt used R/L UEs, wearing hand splints to punch or strike beach ball tossed to him from 3' distance, or use bil hands to roll ball in lap..  Pt required active assistive R shoulder flexion to clear hand from lap and use functionally.  Coordination of movement to return moving ball better L than RUE. Pt left resting in w/c tilted back to 30 degrees, with soft touch call bell in lap.   Blake Vetrano 05/29/2016, 4:35 PM
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
## 2016-05-29 NOTE — Progress Notes (Signed)
Occupational Therapy Session Note  Patient Details  Name: Edward [name] MRN: [id number] Date of Birth: 02/18/1981  Today's Date: 05/29/2016 OT Individual Time: 0850-1000 and 1300-1400 OT Individual Time Calculation (min): 70 min and 60 min   Short Term Goals:Week 3:  OT Short Term Goal 1 (Week 3): LB dressing to be completed with assist +1 in order to decrease caregiver burden OT Short Term Goal 2 (Week 3): Pt will thread B UEs into shirt with set-up assist OT Short Term Goal 3 (Week 3): Will initiate toilet [facility] or padded tub bench   Skilled Therapeutic Interventions/Progress Updates:    Session One: Pt seen for OT session focusing on ADL re-training and sit <> stands. Pt sitting on BSC upon arrival, supported with maximove and finishing bowel program. While pt finished voiding, discussed in depth d/c planning including power vs manual w/c, who would be assisting at home, home layout, OT/PT goals, etc.  Pt transferred back to supine via lift. Total A for hygiene and donning new brief, and max A to roll.  Positioned pt EOB with +2 assist. Attempted LB dressing from EOB, however, due to extensor tone pt at risk for sliding off bed and returned to supine total A. ROM/ stretching and attempted again to get to pt to EOB.  Sitting EOB, pt stood with +2 assist to have pants pulled up total A.  He completed max A sliding board transfer into w/c. UB dressing completed in w/c, pt with increased independence with doffing shirt, able to use L UE to get R UE out of shirt.  Pt left with hand off to PT.   Session Two: Pt seen for OT session focusing on self-feeding and neuro re-ed in standing. Pt sitting in w/c upon arrival with NT present feeding pt lunch. OT took over and pt agreeable to working on self feeding. With dorsal wrist support universal cuff donned, pt able to use L UE to self-feed. He required assist to "stab" food on fork due to decreased radial deviation/ strength. Pt able to  bring fork to mouth from plate, an improvement from previous sessions. Attempted to work on gross grasping of cookie, however, pt with increased difficulty obtaining functional grasp on cookie and requiring increased assist. In therapy gym, pt stood at side of // bars with +2 assist. Tactile cuing/ positioning provided for upright posture with upright chest and anterior pelvic shift. Completed x2 trials, pt tolerating ~1-2 minutes of static standing each trial. Pt voiced increased fatigue during this session in standing due to AM PT session. From seated position, Completed ball squeeze btwn thighs for strengthening. Pt able to control hip AD/ABduction to maintain ball position. Participated in animal assisted therapy during session, pt petting dog with full elbow flexion/ extension. Returned to room and left with all needs in reach.   Therapy Documentation Precautions:  Precautions Precautions: Fall, Cervical Precaution Comments: collar for comfort ( Dr Ditty ordering soft collar 05/08/16) Required Braces or Orthoses: Cervical Brace Cervical Brace: For comfort Restrictions Weight Bearing Restrictions: No ADL: ADL ADL Comments: See Functional Assessment Tool  See Function Navigator for Current Functional Status.   Therapy/Group: Individual Therapy  [name], Lysandra Loughmiller C 05/29/2016, 7:09 AM

## 2016-05-29 NOTE — Progress Notes (Signed)
Physical Therapy Session Note  Patient Details  Name: Edward [name] MRN: [id number] Date of Birth: 12/30/1980  Today's Date: 05/29/2016 PT Individual Time: 1000-1130 PT Individual Time Calculation (min): 90 min    Short Term Goals: Week 3:  PT Short Term Goal 1 (Week 3): Pt will perform transfer with modA +1 PT Short Term Goal 2 (Week 3): Pt will perform bed mobility maxA +1 PT Short Term Goal 3 (Week 3): Pt will perform w/c propulsion maxA +1 PT Short Term Goal 4 (Week 3): Pt will perform dynamic sitting balance with min guard x10 min  Skilled Therapeutic Interventions/Progress Updates:   Pt received seated in w/c, denies pain and agreeable to treatment. Transfer w/c>mat table with transfer board and maxA with assist for anterior weight shift. Sit <>stand elevated mat table with BUE in hand splints on RW and modA +2 initially, faded to minA from one therapist. Static standing with mod verbal/tactile cues for anterior weight shift to reduce reliance of LEs on mat table, lateral weight shifts to facilitate unlocking knees. Weight shifting R/L with min guard from therapist. Gait x9' with RW and +2A for progressing LEs due to tone, pt performing lateral weight shifts without assist. Sit <>stand from w/c with modA from w/c at lower seat height than normal. Returned to room at end of session, then pt requesting to return to tilt in space w/c as family will not be in until later. Transfer standard w/c >tilt in space w/c with maximove and pt directing care. Remained seated in w/c at end of session, all needs in reach.   Therapy Documentation Precautions:  Precautions Precautions: Fall, Cervical Precaution Comments: collar for comfort ( Dr Ditty ordering soft collar 05/08/16) Required Braces or Orthoses: Cervical Brace Cervical Brace: For comfort Restrictions Weight Bearing Restrictions: No   See Function Navigator for Current Functional Status.   Therapy/Group: Individual  Therapy  Edward [name] 05/29/2016, 11:40 AM
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
## 2016-05-30 ENCOUNTER — Inpatient Hospital Stay (HOSPITAL_COMMUNITY): Payer: Self-pay | Admitting: Physical Therapy

## 2016-05-30 ENCOUNTER — Inpatient Hospital Stay (HOSPITAL_COMMUNITY): Payer: Self-pay | Admitting: Occupational Therapy

## 2016-05-30 ENCOUNTER — Inpatient Hospital Stay (HOSPITAL_COMMUNITY): Payer: Medicaid Other

## 2016-05-30 LAB — URINE CULTURE: Culture: >=100000 — AB

## 2016-05-30 NOTE — Progress Notes (Signed)
Occupational Therapy Session Note  Patient Details  Name: Edward [name] MRN: [id number] Date of Birth: 09/13/1980  Today's Date: 05/30/2016 OT Individual TimeSalome Holmes: 0845-1000 and 1300-1400 OT Individual Time Calculation (min): 75 min and 60 min    Short Term Goals:Week 3:  OT Short Term Goal 1 (Week 3): LB dressing to be completed with assist +1 in order to decrease caregiver burden OT Short Term Goal 2 (Week 3): Pt will thread B UEs into shirt with set-up assist OT Short Term Goal 3 (Week 3): Will initiate toilet transfer to [facility] or padded tub bench   Skilled Therapeutic Interventions/Progress Updates:    Session One: Pt seen for OT session focusing on ADL re-training. Pt in supine upon arrival, agreeable to tx session. He voiced having had incontinent BM and needing to be changed. Total A +2 to complete hygiene and don new brief and pants. Maximove used to transfer pt to [facility] for simulated toileting task. Completed sit <> stand from [facility] with max- total A +2. Pt with difficulty maintaining anterior weightshift over RW, requiring +2 to maintain static standing balance. At this time, stand is not at functional level for toileting.  Completed UB dressing seated on BSC. With control at elbow, pt able to assist with threading head into shirt once set-up. Functional use of UEs for grooming tasks completed with donning lotion and total A to clean finger nails. AAROM performed to B UEs. Pt now with slight wrist flexion in gravity eliminated position on R UE, improvements from previous sessions.   Session Two: Pt seen for OT session addressing self-feeding and UE ROM. Pt in supine upon arrival resting, agreeable to tx session. He transferred to EOB with +2 assist and max A sliding board transfer completed into tilt-in-space w/c.  IN therapy day room, pt completed self feeding activity utilizing high/low table and elbow supported on pillow. With fork in dorsal wrist universal cuff and using plate guard, pt  able to self feed sandwich which was pre-cut. Assist required for management of drinking cup. Pt able to self feed ~90% of meal before fatiguing.  In BI gym pt participated in [employer] task working on pt coming forward off back of w/c and extending UEs, min A required at elbow for stability to extend. Pt returned to room at end of session, pt's father present.   Therapy Documentation Precautions:  Precautions Precautions: Fall, Cervical Precaution Comments: collar for comfort ( Dr Ditty ordering soft collar 05/08/16) Required Braces or Orthoses: Cervical Brace Cervical Brace: For comfort Restrictions Weight Bearing Restrictions: No Pain: Pain Assessment Pain Assessment: 0-10 Pain Score: 2  Pain Location: Hand Pain Orientation: Right;Left Pain Descriptors / Indicators: Aching;Burning Patients Stated Pain Goal: 1 Pain Intervention(s): Repositioned ADL: ADL ADL Comments: See Functional Assessment Tool  See Function Navigator for Current Functional Status.   Therapy/Group: Individual Therapy  [name], [name] 05/30/2016, 6:56 AM
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
## 2016-05-30 NOTE — Progress Notes (Signed)
Social Work Patient ID: Edward [name], male   DOB: [birth year], 35 y.o.   MRN: [id number]   CSW met with pt 05-29-16 to update him on team conference discussion.  None of pt's family was present, but pt still wanted CSW to update him and he will pass on to his girlfriend/father/family.  Pt is still unsure who will do bladder and bowel program, especially when girlfriend is working.  Father will be with him during the day, but pt does not feel he will do the cathing.  CSW encouraged pt to be working on this with his family diligently over the next couple of weeks.  Pt was agreeable to staying on CIR for an extra week to increase function as much as possible.  CSW again stated that pt needs to use this time to get ready to be at home.  CSW remains available to assist as needed.

## 2016-05-30 NOTE — Progress Notes (Signed)
Patients girlfriend offered education/demontration on urinary catheterization. Girlfriend denies having education on I/O caths. She states she does not feel comfortable attempting to learn at this point. Two previous nurses have already attempted to offer education at this point.

## 2016-05-30 NOTE — Progress Notes (Signed)
Physical Therapy Session Note  Patient Details  Name: Edward [name] MRN: [id number] Date of Birth: 12/18/1980  Today's Date: 05/30/2016 PT Individual Time: 1100-1200 and 1455-1540 PT Individual Time Calculation (min): 60 min and 45 min (total 105 min)   Short Term Goals: Week 3:  PT Short Term Goal 1 (Week 3): Pt will perform transfer with modA +1 PT Short Term Goal 2 (Week 3): Pt will perform bed mobility maxA +1 PT Short Term Goal 3 (Week 3): Pt will perform w/c propulsion maxA +1 PT Short Term Goal 4 (Week 3): Pt will perform dynamic sitting balance with min guard x10 min  Skilled Therapeutic Interventions/Progress Updates:   Tx 1: pt received seated in w/c, c/o pain as below and agreeable to treatment. Reports he feels more fatigued today and did not have a very good session with OT earlier because he felt tired and weak. Transfer w/c >mat table with maxA using transfer board, cues for weight shifting and LE activation. Sitting balance/tolerance and semi-reclined spine/chest stretch while discussing home setup, family support, d/c plan, possible home eval. Pt with incontinent bowel movement. Returned to room and transferred back to bed with maxA. Rolling R/L with min/modA to perform hygiene and change brief. PROM to BLE hands and pt demonstrating 2/5 LLE wrist extension, 1/5 RLE wrist extension. Remained semi-reclined at end of session, all needs in reach.   Tx 2: Pt received seated in w/c, denies pain and agreeable to treatment. Transfer w/c >mat table maxA with transfer board. Sit <>stand x3 trials from elevated mat table with RW and modA +1. Unable to progress LEs due to significant tone in LEs and pt declining to wear AFOs at this time. On third trial of standing pt had incontinent bowel movement. Transferred back to w/c maxA. In room, educated pt's father on hoyer lift, demonstrated placement of sling and setup of lift with father performing hands-on assist to place sling and setup lift.  Ultimately opted to not complete full transfer as sling available was not the sling pt would be using at home. Father agreeable to assist with slide board transfer to bed; performed modA +2 with pt directing care. Sit >supine maxA with father providing assist. Remained supine in bed at end of session, all needs in reach and RN alerted to incontinent bowel movement.  Therapy Documentation Precautions:  Precautions Precautions: Fall, Cervical Precaution Comments: collar for comfort ( Dr Ditty ordering soft collar 05/08/16) Required Braces or Orthoses: Cervical Brace Cervical Brace: For comfort Restrictions Weight Bearing Restrictions: No Pain: Pain Assessment Pain Assessment: 0-10 Pain Score: 1  Pain Location: Hand Pain Orientation: Right;Left Pain Descriptors / Indicators: Aching;Burning Patients Stated Pain Goal: 1 Pain Intervention(s): Medication (See eMAR);Repositioned   See Function Navigator for Current Functional Status.   Therapy/Group: Individual Therapy  Vista Lawmanlizabeth J Tygielski 05/30/2016, 12:15 PM

## 2016-05-31 ENCOUNTER — Inpatient Hospital Stay (HOSPITAL_COMMUNITY): Payer: Self-pay | Admitting: Physical Therapy

## 2016-05-31 ENCOUNTER — Inpatient Hospital Stay (HOSPITAL_COMMUNITY): Payer: Self-pay | Admitting: Occupational Therapy

## 2016-05-31 ENCOUNTER — Inpatient Hospital Stay (HOSPITAL_COMMUNITY): Payer: Medicaid Other

## 2016-05-31 MED ORDER — SENNOSIDES-DOCUSATE SODIUM 8.6-50 MG PO TABS
2.0000 | ORAL_TABLET | ORAL | Status: DC
  Administered 2016-05-31 – 2016-06-14 (×8): 2 via ORAL
  Filled 2016-05-31 (×8): qty 2

## 2016-05-31 MED ORDER — BISACODYL 10 MG RE SUPP
10.0000 mg | RECTAL | Status: DC
  Administered 2016-06-01 – 2016-06-15 (×8): 10 mg via RECTAL
  Filled 2016-05-31 (×8): qty 1

## 2016-05-31 MED ORDER — BETHANECHOL CHLORIDE 10 MG PO TABS: 10.0000 mg | ORAL_TABLET | Freq: Three times a day (TID) | ORAL | Status: DC

## 2016-05-31 NOTE — Plan of Care (Signed)
Problem: RH Dressing Goal: LTG Patient will perform lower body dressing w/assist (OT) LTG: Patient will perform lower body dressing with assist, with/without cues in positioning using equipment (OT)  Outcome: Not Applicable Date Met: 81/15/72 Goal d/c at this time due to pt progress- Cathyann Kilfoyle [name], OTR/L  Problem: RH Toilet Transfers Goal: LTG Patient will perform toilet transfers w/assist (OT) LTG: Patient will perform toilet transfers with assist, with/without cues using equipment (OT)  Outcome: Not Applicable Date Met: 62/03/55 Goal d/c at this time due to pt progress with focus on decreasing caregiver burden. Will complete toilet transfer via [place]

## 2016-05-31 NOTE — Progress Notes (Signed)
Physical Therapy Weekly Progress Note  Patient Details  Name: Edward [name] MRN: [id number] Date of Birth: 05/18/1981    Today's Date: 05/31/2016 PT Individual Time: 1000-1100 and 1300-1400 PT Individual Time Calculation (min): 60 min and 60 min (total 120 min)   PT Short Term Goals Week 3:  PT Short Term Goal 1 (Week 3): Pt will perform transfer with modA +1 PT Short Term Goal 2 (Week 3): Pt will perform bed mobility maxA +1 PT Short Term Goal 3 (Week 3): Pt will perform w/c propulsion maxA +1 PT Short Term Goal 4 (Week 3): Pt will perform dynamic sitting balance with min guard x10 min  Skilled Therapeutic Interventions/Progress Updates:   Tx 1: Pt received seated in w/c, denies pain and agreeable to treatment. Transfer w/c>,mat table and w/c>power w/c with transfer board and maxA. In power w/c, educated pt on controls and features. Pt drove power w/c with S at variable speeds in controlled indoor environments including navigating around obstacles, cones, parking w/c in preparation for transfers. Performed lateral leans R/L to touch cones on floor for carryover into lateral leans for transfer board placement and pressure relief. Pt demonstrated ability to perform pressure relief independently using chair controls. Remained seated in w/c at end of session, all needs in reach.   Tx 2: Pt received seated in power w/c; c/o "crick in my neck" but does not rate, otherwise agreeable to treatment. Transported to gym with pt steering w/c with S; improving ability to manage buttons/controls on chair. Transfer w/c <>car with transfer board and max/total +2A with spasticity limiting ability to manage LEs and trunk control without assist. Transfer w/c>mat table with modA +2. Sit <>stand x3 trials with modA and RW, B hand splints. Once in standing, performed mini-squats x5 reps with min guard and alternating forward/backward steps with maxA for LE progression due to tone. Transfer mat>w/c with minA with  significantly improved weight shifting and scooting. Remained seated in w/c with handoff to OT for next session.   Therapy Documentation Precautions:  Precautions Precautions: Fall, Cervical Precaution Comments: collar for comfort ( Dr Ditty ordering soft collar 05/08/16) Required Braces or Orthoses: Cervical Brace Cervical Brace: For comfort Restrictions Weight Bearing Restrictions: No   See Function Navigator for Current Functional Status.  Therapy/Group: Individual Therapy  Vista Lawmanlizabeth J Tygielski 05/31/2016, 11:01 AM
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
## 2016-05-31 NOTE — Progress Notes (Signed)
Occupational Therapy Weekly Progress Note  Patient Details  Name: Edward [name] MRN: [id number] Date of Birth: 1981/06/07  Beginning of progress report period: May 24, 2016 End of progress report period: May 31, 2016  Today's Date: 05/31/2016 OT Individual Time: 6295-2841 and 1400-1500 OT Individual Time Calculation (min): 55 min and 60 min   Patient has met 3 of 3 short term goals.  Pt cont to make steady progress with OT. He has begun getting to [facility] via maximove as part of bowel program with nursing staff, returning to bed for clothing management to be completed. He cont to require max A +1-2 for sliding board transfers. Have begun completing LB dressing seated from EOB with +2 total A to stand and have pants pulled up. However, at this time would not recommend this method for home use as transfer and set-up can be dangerous due to pt's extensor tone and would not feel safe having unskilled caregivers to perform this. LB dressing can be completed from bed level with total A +1, though +2 is helpful to assist with functional bed mobility and positioning. Pt has made functional gains with self feeding and grooming utilizing dorsal wrist support universal cuff.  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  Patient not progressing toward long term goals.  See goal revision. He cont to make gains in therapy, however, at this time are not resulting in functional, measurable gains. Cont to work towards reducing caregiver burden by increasing skills with bed mobility and transfer techniques as well as extensive education and trialing of modified ADL techniques.  Plan of care revisions: Have d/[name] shower transfer goal as it is not appropriate at this time. LB and UB dressing goals downgraded due to pt progress. Have  upgraded functional use of extremitities and cognition goals. ADL goals now with focus on pt directing care and assisting at gross level. .  OT Short Term Goals Week 3:  OT Short Term Goal 1 (Week 3): LB dressing to be completed with assist +1 in order to decrease caregiver burden OT Short Term Goal 1 - Progress (Week 3): Met OT Short Term Goal 2 (Week 3): Pt will thread B UEs into shirt with set-up assist OT Short Term Goal 2 - Progress (Week 3): Met OT Short Term Goal 3 (Week 3): Will initiate toilet transfer to [facility] or padded tub bench  OT Short Term Goal 3 - Progress (Week 3): Met Week 4:  OT Short Term Goal 1 (Week 4): [name] will provide assist for UB and LB dressing with pt directing care as part of caregiver training OT Short Term Goal 2 (Week 4): Pt will transfer to [facility] utilizing [place] lift with pt's family members assisting as part of caregiver training OT Short Term Goal 3 (Week 4): Pt will independently direct caregiver for proper set-up of meal tray and AE for self feeding task as part of d/[name] planning.    Skilled Therapeutic Interventions/Progress Updates:    Session One: Pt seen for OT session focusing on functional mobility and strengthening in prep for ADL tasks. Pt in supine upon arrival, agreeable to tx session. He declined working on bathing/dressing this morning.  He transferred to EOB with +2 assist, and sliding board transfer completed into w/[name].  In therapy gym seated on EOM, pt completed lateral leans to B sides.  Began from elevated level leaning on yoga blocks and returning to upright position with guarding assist. He then completed on leans onto mat, requiring mod A to return to midline when leaning to L and required max- total A for balance and to return to midline when leaning to R. Pt transferred back into chair at end of session, returned to room and left reclined in w/[name] with all needs in reach.   Session Two: Pt seen for OT session focusing on bed  mobility/ rolling techniques and neuro re-ed. Pt received in power w/[name] in gym upon arrival with hand off from PT. Sliding board transfers completed throughout session with pt requiring mod-max A on transfers depending on direction of transfer (uphill vs downhill) and current level of spasms/ tone. On therapy mat, worked on transitioning supine>sidelying>prone> sidelying> EOM. Required assist for management of LEs due to tone. Once pt's knees flexed and with assist to hold in position, pt able to complete roll. VCs and tactile cues provided for technique for optimum positioning. In supine, worked on weightbearing through B forearms/ shoulders, cervical extension x10 reps, and scapular retraction. Pt returned to power chair at end of session, returned to room with family members.  Therapy Documentation Precautions:  Precautions Precautions: Fall, Cervical Precaution Comments: collar for comfort ( Dr Ditty ordering soft collar 05/08/16) Required Braces or Orthoses: Cervical Brace Cervical Brace: For comfort Restrictions Weight Bearing Restrictions: No ADL: ADL ADL Comments: See Functional Assessment Tool  See Function Navigator for Current Functional Status.   Therapy/Group: Individual Therapy  Edward [name], Edward [name] 05/31/2016, 7:04 AM

## 2016-06-01 ENCOUNTER — Inpatient Hospital Stay (HOSPITAL_COMMUNITY): Payer: Medicaid Other | Admitting: Physical Therapy

## 2016-06-01 DIAGNOSIS — G904 Autonomic dysreflexia: Secondary | ICD-10-CM
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
## 2016-06-02 ENCOUNTER — Inpatient Hospital Stay (HOSPITAL_COMMUNITY): Payer: Medicaid Other | Admitting: Occupational Therapy

## 2016-06-02 NOTE — Progress Notes (Signed)
Occupational Therapy Session Note  Patient Details  Name: Edward [name] MRN: [id number] Date of Birth: 08/11/1980  Today's Date: 06/02/2016 OT Individual Time: 0800-0930 OT Individual Time Calculation (min): 90 min     Short Term Goals:Week 4:  OT Short Term Goal 1 (Week 4): [name] will provide assist for UB and LB dressing with pt directing care as part of caregiver training OT Short Term Goal 2 (Week 4): [facility] with pt's family members assisting as part of caregiver training OT Short Term Goal 3 (Week 4): Pt will independently direct caregiver for proper set-up of meal tray and AE for self feeding task as part of d/c planning.   Skilled Therapeutic Interventions/Progress Updates:    Pt seen for OT session focusing caregiver training. Pt in supine upon arrival, pt's son, fiancee, and father present for session. Throughout session,pt's girlfriend and son assisted with all sliding  Board transfers. Transfers completed  Bed> drop arm  BSC>  Bed> power w/c. Verbal and demonstrational cues provided throughout for technique and proper  Body mechanics.Pt able to assist with directing caregivers. Reviewed lateral leans for clothing management, pt demonstrated ability to functional lean to L onto  Bed with close supervision in prep for toileting tasks. Reviewed with family pros and cons of sliding  Board vs hoyer lift [facility]. Pt and family still deciding which method they will use, however, voiced wanting to learn both. Pt voiced  Being uncomfortable with new cushion in power chair, obtained new cushion to trial. Educated regarding what insurance will cover as far as DME, specifically with w/c cushions and air mattress.  Educated extensively throughout session regarding HHOT/HHPT vs OPOT/ OPPT, DME, home layout, building  A ramp, importance of proper  Body mechanics, transfer techniques, and d/c planning.   Therapy  Documentation Precautions:  Precautions Precautions: Fall, Cervical Precaution Comments: collar for comfort ( Dr Ditty ordering soft collar 05/08/16) Required Braces or Orthoses: Cervical Brace Cervical Brace: For comfort Restrictions Weight Bearing Restrictions: No ADL: ADL ADL Comments: See Functional Assessment Tool  See Function Navigator for Current Functional Status.   Therapy/Group: Individual Therapy  [name], Tran [name] 06/02/2016, 6:47 AM

## 2016-06-03 ENCOUNTER — Inpatient Hospital Stay (HOSPITAL_COMMUNITY): Payer: Medicaid Other | Admitting: Occupational Therapy

## 2016-06-03 ENCOUNTER — Inpatient Hospital Stay (HOSPITAL_COMMUNITY): Payer: Self-pay | Admitting: Physical Therapy

## 2016-06-03 NOTE — Progress Notes (Signed)
Occupational Therapy Session Note  Patient Details  Name: Edward [name] MRN: [id number] Date of Birth: [name]  Today's Date: 06/03/2016 OT Individual Time: 0845-1000 and 1300-1430 OT Individual Time Calculation (min): 75 min and 90 min    Short Term Goals:Week 4:  OT Short Term Goal 1 (Week 4): [name] will provide assist for UB and LB dressing with pt directing care as part of caregiver training OT Short Term Goal 2 (Week 4): [facility] with pt's family members assisting as part of caregiver training OT Short Term Goal 3 (Week 4): Pt will independently direct caregiver for proper set-up of meal tray and AE for self feeding task as part of d/c planning.   Skilled Therapeutic Interventions/Progress Updates:    Session One: Pt seen for OT session focusing on functional positioning and mobility with ADL tasks. Pt in supine upon arrival, agreeable to tx session. [facility], pt placed in figure four position in prep for LB dressing. Worked on pt coming forward to reach foot, attempted to have pt pull off sock that was already 95% of the way off, however, pt unable to grasp with one UE and unable to come forward enough to use B UEs to pull sock off. Using B UEs, provided pt with pair of shorts and worked on him using B UEs to position pants in prep for threading. He rolled with mod-max A for brief to be changed as pt thought he had incontinent BM. He transferred into sidelying with supervision once B knees were flexed. Max A sliding board transfer completed into w/c. In power w/c, addressed positioning and functional UE use in prep for clothing management for pt to use urinal in chair. Pt demonstrated ability to manage hand held urinal, however, required assist for clothing management.  Grooming completed at sink, min A for steadying UE when using bathmit. He was able to brush teeth with set-up assist utilizing dorsal wrist support  universal cuff.PT with improved ROM and strength in UE for functional use. Pt left seated in power chair at end of session, seat belt on and all needs in reach.   Session Two: Pt seen for OT session focusing on functional transfers and positioning/ stretching in prep for ADL tasks. Pt received in power w/c with dad present, agreeable to tx session. In therapy gym, pt completed stand pivot transfer to therapy mat with +2 assist and total A VCs for foot placement/ sequencing of steps with manual facilitation required to advance LEs due to tone. On therapy mat, pt placed in long sitting position with back supported against wall. Attempted figure 4 position as part of simulated LB dressing task. Pt unable to flex forward to find balance point without back support. Attempted position in long sitting with pt's UEs supported on bench, however, due to tightness in back/ extensor tone pt unable to maintain balance. He then voiced desire for stretching of back. Pt placed in supine and wedge placed on lumbar area of back. While in supine, pt competed UE ROM activities against gravity and gross motor grasping to hold and manipulate tennis ball. He was able to grasp and release ball using R UE, however, unable to functionally grasp with L UE due to decreased flexion/ extension in digits. Pt able to flex R digits with increased time and effort, however, unable to extend. Pt placed back in long sitting, however, pt still unable to maintain balance, however, with support pt able to  reach forward towards feet. He returned back to w/c in same manner as described above. Left in power chair returning to room with father.   Therapy Documentation Precautions:  Precautions Precautions: Fall, Cervical Precaution Comments: collar for comfort ( Dr Ditty ordering soft collar 05/08/16) Required Braces or Orthoses: Cervical Brace Cervical Brace: For comfort Restrictions Weight Bearing Restrictions: No Pain: Pain  Assessment Pain Assessment: No/denies pain Pain Score: 0-No pain ADL: ADL ADL Comments: See Functional Assessment Tool  See Function Navigator for Current Functional Status.   Therapy/Group: Individual Therapy  [name], Edward Twaddell C 06/03/2016, 7:08 AM

## 2016-06-03 NOTE — Plan of Care (Signed)
Problem: RH Wheelchair Mobility Goal: LTG Patient will propel w/c in home environment (PT) LTG: Patient will propel wheelchair in home environment, # of feet with assistance (PT).  Upgraded to modI d/t pt using power chair
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
## 2016-06-03 NOTE — Progress Notes (Signed)
Physical Therapy Weekly Progress Note  Patient Details  Name: Edward [name] MRN: [id number] Date of Birth: [birth year]  Beginning of progress report period: May 24, 2016 End of progress report period: June 03, 2016  Today's Date: 06/03/2016 PT Individual Time: 1100-1200 and 1500-1535 PT Individual Time Calculation (min): 60 min and 35 min (total 95 min)    Patient has met 3 of 4 short term goals. Patient has met 3 of 4 short term goals.  Pt currently requires maxA for bed mobility, mod/maxA for transfers with slide board, and variable mod/maxA for sit <>stand with RW. Have attempted ambulation however heavy +2A needed due to tone in LEs preventing pt from progressing LEs. Therapist has been discussing d/c planning with pt and family, as pt has several barriers to going home including home setup, variety of caregivers, determining most appropriate equipment. Planned evaluation for power w/c due to slow progress and limited use of B hands for w/c propulsion, however do notice small improvements in strength and functional use of UEs each day. Have begun some hands-on training with family but will need to continue and progress to prepare for d/c home.  Patient continues to demonstrate the following deficits:  activity tolerance, balance, postural control, ability to compensate for deficits, functional use of  right upper extremity, right lower extremity, left upper extremity and left lower extremity and coordination and therefore will continue to benefit from skilled PT intervention to enhance overall performance with bed mobility, transfers, w/c management, pre-gait activities, home and community access.   Patient progressing slowly towards long term goals..  Plan of care revisions: Bed mobility and transfer goals downgraded to modA, w/c propulsion modI due to pt using power chair. Added car transfer goal maxA. [name] Kitchen  PT Short Term Goals Week 3:  PT Short Term Goal 1 (Week 3): Pt will perform  transfer with modA +1 PT Short Term Goal 1 - Progress (Week 3): Met PT Short Term Goal 2 (Week 3): Pt will perform bed mobility maxA +1 PT Short Term Goal 2 - Progress (Week 3): Met PT Short Term Goal 3 (Week 3): Pt will perform w/c propulsion maxA +1 PT Short Term Goal 3 - Progress (Week 3): Other (comment) (goal met in power w/c, manual w/c goal d/c'ed due to slow progress) PT Short Term Goal 4 (Week 3): Pt will perform dynamic sitting balance with min guard x10 min PT Short Term Goal 4 - Progress (Week 3): Met Week 4:  PT Short Term Goal 1 (Week 4): Pt will perform bed mobility with modA PT Short Term Goal 2 (Week 4): Pt will perform stand pivot transfer with maxA +1 and LRAD PT Short Term Goal 3 (Week 4): Pt's family will demonstrate transfer bed <>w/c with maxA  PT Short Term Goal 4 (Week 4): Pt's family will demonstrate car transfer with +2A   Skilled Therapeutic Interventions/Progress Updates:   Tx 1: pt received seated in power w/c with no c/o pain and agreeable to treatment; does report increased "stiffness" this AM and just recently took medication. Transported self to gym in power w/c with S. Transfer w/c>mat table with maxA using transfer board. BLE AFOs donned totalA, with minA for sitting balance while LEs moved to don shoes. Sit <>stand with modA and RW from slightly elevated mat table. Gait x33' with RW and +2A for RW management, cues for LE knee flexion to reduce recurvatum, and weight shifting. Stand pivot transfer mat >w/c with RW and mod/maxA for pivotal steps due  to incoordination and tone. Returned to room in w/c with S; remained seated in w/c at end of session, all needs in reach.   Tx 2: Pt received in gym in power chair; denies pain but does report increased tone and fatigue after several sessions today. Transfer w/c <>nustep with transfer board and modA. Performed Nustep x6 min total with occasional rest breaks due to fatigue, average 23 steps/min. Performed with BLE assist  braces however when removed pt able to continue pedaling while maintaining knee alignment. Returned to w/c as above; pt drove w/c back to room modI with pt's father transporting equipment.   Therapy Documentation Precautions:  Precautions Precautions: Fall, Cervical Precaution Comments: collar for comfort ( Dr Ditty ordering soft collar 05/08/16) Required Braces or Orthoses: Cervical Brace Cervical Brace: For comfort Restrictions Weight Bearing Restrictions: No   See Function Navigator for Current Functional Status.  Therapy/Group: Individual Therapy  Luberta Mutter 06/03/2016, 1:22 PM

## 2016-06-03 NOTE — Plan of Care (Signed)
Problem: RH Bed Mobility Goal: LTG Patient will perform bed mobility with assist (PT) LTG: Patient will perform bed mobility with assistance, with/without cues (PT).  downgraded d/t slow progress  Problem: RH Bed to Chair Transfers Goal: LTG Patient will perform bed/chair transfers w/assist (PT) LTG: Patient will perform bed/chair transfers with assistance, with/without cues (PT).  downgraded d/t slow progress  Problem: RH Furniture Transfers Goal: LTG Patient will perform furniture transfers w/assist (OT/PT LTG: Patient will perform furniture transfers  with assistance (OT/PT).  Outcome: Not Applicable Date Met: 80/22/33 D/c due to slow progress   Problem: RH Ambulation Goal: LTG Patient will ambulate in controlled environment (PT) LTG: Patient will ambulate in a controlled environment, # of feet with assistance (PT).  Outcome: Not Applicable Date Met: 61/22/44 D/c d/t slow progress  Problem: RH Wheelchair Mobility Goal: LTG Patient will propel w/c in controlled environment (PT) LTG: Patient will propel wheelchair in controlled environment, # of feet with assist (PT)  Upgraded to modI d/t pt using power chair

## 2016-06-04 ENCOUNTER — Inpatient Hospital Stay (HOSPITAL_COMMUNITY): Payer: Self-pay | Admitting: Occupational Therapy

## 2016-06-04 ENCOUNTER — Inpatient Hospital Stay (HOSPITAL_COMMUNITY): Payer: Self-pay | Admitting: Physical Therapy

## 2016-06-04 MED ORDER — TIZANIDINE HCL 2 MG PO TABS
2.0000 mg | ORAL_TABLET | Freq: Three times a day (TID) | ORAL | Status: DC
  Administered 2016-06-04 – 2016-06-07 (×9): 2 mg via ORAL
  Filled 2016-06-04 (×9): qty 1

## 2016-06-04 NOTE — Progress Notes (Signed)
Physical Therapy Session Note  Patient Details  Name: Edward HolmesJoseph G [name] MRN: [id number] Date of Birth: 10/05/1980  Today's Date: 06/04/2016 PT Individual Time: 0900-1000 and 1430-1540  PT Individual Time Calculation (min): 60 min and 70 min (total 130 min)   Short Term Goals: Week 4:  PT Short Term Goal 1 (Week 4): Pt will perform bed mobility with modA PT Short Term Goal 2 (Week 4): Pt will perform stand pivot transfer with maxA +1 and LRAD PT Short Term Goal 3 (Week 4): Pt's family will demonstrate transfer bed <>w/c with maxA  PT Short Term Goal 4 (Week 4): Pt's family will demonstrate car transfer with +2A  Skilled Therapeutic Interventions/Progress Updates:   Tx 1: Pt received seated in w/c, c/o pain as below and agreeable to treatment but declines standing due to pain. Educated pt in low back/lateral trunk self stretch at sink with pt managing w/c placement and UEs on sink with min cues; recommended pt perform throughout the day to reduce spasticity and tone. Pt drove w/c to/from therapy gym modI. Transfer w/c >mat table with pt setting up w/c, unbuckling seatbelt, minA to lift RUE arm rest, and performs L lateral lean to A with board placement. Transfer modA to mat. Sit >supine modA for LE management. Rolling R/L x3 each side with assist for LE placement in hook lying. R sidelying >sit x2 trials with modA due to extensor spasticity. Repetitive sidelying propped on elbow >sit x5 reps each side with S, however poor carryover into sidelying>sit. Transfer back to w/c with slideboard and modA. Returned to room in power chair modI.    Tx 2: Pt received seated in w/c with mother present, agreeable to treatment but again declining standing/gait due to pain; educated pt in importance of continuing practice with LE strengthening in functional activities however pt continues to decline. Provided pt with hand activities including grip ball and theraputty for BUE strengthening per pt request. Applied  theraband to B footrests on w/c to prevent feet from sliding off footrests during repositioning in chair due to extensor tone. Car transfer performed with girlfriend providing totalA pt assisting <25%, however pt directing care including technique, board and foot placement. Discussed plan with pt and SO to perform car transfer in/out of their car when SO comes for family training. Returned to room at end of session in w/c modI.   Therapy Documentation Precautions:  Precautions Precautions: Fall, Cervical Precaution Comments: collar for comfort ( Dr Ditty ordering soft collar 05/08/16) Required Braces or Orthoses: Cervical Brace Cervical Brace: For comfort Restrictions Weight Bearing Restrictions: No Pain: Pain Assessment Pain Assessment: 0-10 Pain Score: 2  Pain Type: Acute pain Pain Location: Generalized Pain Intervention(s): Medication (See eMAR) Multiple Pain Sites: Yes   See Function Navigator for Current Functional Status.   Therapy/Group: Individual Therapy  Vista Lawmanlizabeth J Tygielski 06/04/2016, 9:52 AM

## 2016-06-04 NOTE — Progress Notes (Signed)
Occupational Therapy Session Note  Patient Details  Name: Edward [name] MRN: [id number] Date of Birth: 04/16/1981  Today's Date: 06/04/2016 OT Individual Time: 0700-0800 and 1300-1400 OT Individual Time Calculation (min): 60 min and 60 min    Short Term Goals:Week 4:  OT Short Term Goal 1 (Week 4): [name] will provide assist for UB and LB dressing with pt directing care as part of caregiver training OT Short Term Goal 2 (Week 4): [facility] with pt's family members assisting as part of caregiver training OT Short Term Goal 3 (Week 4): Pt will independently direct caregiver for proper set-up of meal tray and AE for self feeding task as part of d/c planning.   Skilled Therapeutic Interventions/Progress Updates:    Session One: Pt seen for OT session focusing on family education with pt's fiancee. Pt in supine upon arrival with NT present, pt attempting to void in hand held urinal. With increased time and water running in sink, pt able to void to eliminate need for cathing this morning! Session with focus on pt's fiancee assisting with LB dressing from bed level, donning of new brief, transfer to EOB, and sliding board transfer into power w/c. [name] demonstrated ability to provide needed assist with VCs and demonstration from therapist for proper body mechanics and bed mobility techniques, pt able to assist with directing care. +2 steadying assist provided by therapist when pt transferring EOB> to power w/c and for repositioning once in  W/c.  Pt left seated in power w/c at end of session, fiancee present to assist with changing shirt.  When changing brief, noted red mark on pt's coccyx. RN made aware and assessed. Barrier cream applied per Charity fundraiserN. Educated throughout session regarding DME, home layout, bed mobility techniques, importance of proper body mechanics, and d/c planning.    Session Two: Pt seen for OT session focusing on functional  transfers and ADL re-training. Pt in tilt-in-space w/c upon arrival, pt's mother present, and agreeable to tx session.  Pt completed sliding board transfer power w/c <> drop arm BSC, completed with max A x1 with +2 for safety. He completed lateral leans onto bed and into w/c seat with close supervision for clothing management to be completed total A. Pt demonstrated ability to lean to B sides and return to midline with supervision. Pt required increased assist with management of LEs as pt with extensor tone that would throw off balance. He transferred back to power w/c. Pt left in power w/c with mother and RN present.  Educated and showed pt various options of DME including padded tub bench with cut out as pt with complaints of uncomfortable BSC. Will trial padded bench tomorrow as part of AM bowel program. Cont to discuss d/c planning, who will be assisting at d/c, and coordination family ed with assist of CSW.   Therapy Documentation Precautions:  Precautions Precautions: Fall, Cervical Precaution Comments: collar for comfort ( Dr Ditty ordering soft collar 05/08/16) Required Braces or Orthoses: Cervical Brace Cervical Brace: For comfort Restrictions Weight Bearing Restrictions: No Pain: Pain Assessment Pain Assessment: 0-10 Pain Score: 5  Pain Type: Nerve Pain Pain Location: Generalized Pain Frequency: Constant Patients Stated Pain Goal: 0 Pain Intervention(s): Medication (See eMAR), RN aware, repositioned ADL: ADL ADL Comments: See Functional Assessment Tool  See Function Navigator for Current Functional Status.   Therapy/Group: Individual Therapy  [name], Bertha Earwood C 06/04/2016, 6:38 AM

## 2016-06-04 NOTE — Plan of Care (Signed)
Problem: SCI BOWEL ELIMINATION Goal: RH STG MANAGE BOWEL WITH ASSISTANCE STG Manage Bowel with Max Assistance.    Outcome: Progressing Pt on bowel program

## 2016-06-04 NOTE — Progress Notes (Signed)
Social Work Patient ID: Edward [name], male   DOB: 02-02-1981, 35 y.o.   MRN: [id number]   Met with patient and girlfriend this afternoon to review team conference and discuss further discharge planning issues.  Both aware that team continues to plan for 11/25 d/c date, however, they did question if this could possibly be extended.  Discussed need for medical necessity for extension.  They then bring up questions about how they might cover the need for 24/7 assistance.  Reminded them that team has anticipated need for 24/7 physical assistance needs from admission to CIR and that I had reviewed the limited assistance available under Medicaid.  GF questions possibility for "another facility".  Explained (again) that the only option would be SNF and the issues that they would encounter with that option (most notably the likelihood of NO THERAPY being provided).   Pt and gf report that pt's father, son and mother can  help but not during the day or consistently.  Explained that they must talk to one another and develop a confirmed d/c care plan ASAP.  Gf continues to offer option of stopping her work or changing hours (which she has spoken about since admission).  Stressed to her that they have to decide.  They are still "working on" a ramp and haven't decided on whether to build or rent.  This is also a need that they have been aware of since admit.  They understand that I will assist with DME,  HH/OP follow up and PCS Aide referral, however, they must set up the 24/7 care.  Will follow up with them again this week.  Edward Finelli, LCSW
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
## 2016-06-05 ENCOUNTER — Inpatient Hospital Stay (HOSPITAL_COMMUNITY): Payer: Medicaid Other | Admitting: *Deleted

## 2016-06-05 ENCOUNTER — Inpatient Hospital Stay (HOSPITAL_COMMUNITY): Payer: Medicaid Other | Admitting: Occupational Therapy

## 2016-06-05 ENCOUNTER — Inpatient Hospital Stay (HOSPITAL_COMMUNITY): Payer: Self-pay | Admitting: Physical Therapy
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
## 2016-06-05 NOTE — Progress Notes (Signed)
Recreational Therapy Session Note  Patient Details  Name: Edward [name] MRN: [id number] Date of Birth: 09/09/1980 Today's Date: 06/05/2016  Pain: no c/o  Skilled Therapeutic Interventions/Progress Updates: Pt received in w/c, emotional and frustrated following catheterization due to inability to void. Offered emotional support and discussed gradual healing progress, highlighting significant progress he has made in the last week especially related to beginning to void volitionally. Cotreatment with PT with focus on w/c >mat table transfer using slide board with Max A due to fatigue. Also focused on dynamic sitting balance/tolerance during Wii boxing using mobile arm support with 3 weights per side for BUEs.  Pt then performed stand pivot transfer using RW (without AFO's per pt request) with Mod assist.  Pt returned to room via power w/c with Mod I at end of sesion.   Therapy/Group: Co-Treatment  Magdaleno Lortie 06/05/2016, 4:29 PM

## 2016-06-05 NOTE — Progress Notes (Signed)
Physical Therapy Session Note  Patient Details  Name: Edward [name] MRN: [id number] Date of Birth: 01/23/1981  Today's Date: 06/05/2016 PT Individual Time: 1045-1200 and 1430-1545 PT Individual Time Calculation (min): 75 min and 75 min (total 150 min)    Short Term Goals: Week 4:  PT Short Term Goal 1 (Week 4): Pt will perform bed mobility with modA PT Short Term Goal 2 (Week 4): Pt will perform stand pivot transfer with maxA +1 and LRAD PT Short Term Goal 3 (Week 4): Pt's family will demonstrate transfer bed <>w/c with maxA  PT Short Term Goal 4 (Week 4): Pt's family will demonstrate car transfer with +2A  Skilled Therapeutic Interventions/Progress Updates:   Tx 1: Pt received seated in w/c, denies pain and agreeable to treatment. Transport to/from gym with power chair modI. Transfer w/c>mat table with transfer board and modA. Gait with RW and B hand splints, BLE PLS AFO's, and modA for weight shifting, single leg balance, and assist for progressing LE's <20% of steps; performed 2x20'. On second trial noted increased clonus in BLE due to fatigue. Stand pivot transfer to return to w/c with RW and modA for sit <>stand, min guard for pivotal steps to w/c. MinA for scooting hips back in w/c. Replaced old cushion with roho hybrid with more dense foam for positioning and LE alignment, air levels checked for appropriate fit and pressure relief. Returned to room modI I w/c at end of session.   Tx 2: Pt received in w/c, emotional and frustrated following catheterization due to inability to void. Offered emotional support and discussed gradual healing progress, highlighting significant progress he has made in the last week especially related to beginning to void volitionally. Co-treat with recreational therapy. Transfer w/c >mat table with transfer board and maxA due to fatigue. Sitting balance/tolerance with therapist providing external cues for anterior pelvic tilt, scapular retraction, and active  assist pec stretch. BUE's in mobile arm support with 3 weights per side while pt engaged in Wii boxing. Returned to w/c with stand pivot transfer with modA and RW, increased assist compared to earlier session due to fatigue and no AFO's donned per pt request. Returned to room in w/c modI at end of session.   Therapy Documentation Precautions:  Precautions Precautions: Fall, Cervical Precaution Comments: collar for comfort ( Dr Ditty ordering soft collar 05/08/16) Required Braces or Orthoses: Cervical Brace Cervical Brace: For comfort Restrictions Weight Bearing Restrictions: No   See Function Navigator for Current Functional Status.   Therapy/Group: Individual Therapy  Vista Lawmanlizabeth J Tygielski 06/05/2016, 4:13 PM

## 2016-06-05 NOTE — Progress Notes (Signed)
Occupational Therapy Session Note  Patient Details  Name: Edward [name] MRN: [id number] Date of Birth: 12/05/1980  Today's Date: 06/05/2016 OT Individual Time: 0845-1000 and 1300-1400 OT Individual Time Calculation (min): 75 min and 60 min    Short Term Goals:Week 4:  OT Short Term Goal 1 (Week 4): [name] will provide assist for UB and LB dressing with pt directing care as part of caregiver training OT Short Term Goal 2 (Week 4): [facility] Hoyer lift with pt's family members assisting as part of caregiver training OT Short Term Goal 3 (Week 4): Pt will independently direct caregiver for proper set-up of meal tray and AE for self feeding task as part of d/[name] planning.   Skilled Therapeutic Interventions/Progress Updates:    Session One: Pt seen for OT session focusing on self feeding and UB bathing/dressing. Pt in supine upon arrival with fiancee present, she was present for first 15 minutes of therapy and then left for work. Pants changed total A in supine with pt's fiancee assisting therapist, pt able to roll with min A once knee is bent. Pt with increased tone this morning, reports feeling more cold this morning than usual. LE stretches performed by therapist prior to OOB mobility. He transferred to EOB with max- total A +1 using hospital bed functions. Sliding board transfer completed to power w/[name]. In therapy dayroom, pt ate breakfast with set-up assist at high low table utilizing dorsal wrist support universal cuff, dycem, plate guard, and weighted cup with elongated straw. Increased time and effort required for pt to self feed 90% of meal. He returned to room and completed UB bathing using bath mit with min A to reach opposite side shoulder. Pt able to doff shirt once removed from overhead, able to wiggle arms out of shirt independently. Max A required to don clean shirt. Pt left seated in power chair at end of session, all needs in reach. Cont to  discuss DME needs at d/[name]. Pt voiced liking padded tub bench with cut out for toileting task this morning, and is requesting one for d/[name]. Will follow up with CSW.   Session Two: Pt seen for OT session focusing on self feeding and toilet transfer. Pt received in power w/[name] with dad present, pt wanting to work on self feeding again this session. In therapy day room utilizing high/low table, pt independently instructed caregiver for proper set-up of AE. He was able to self feed following set-up and assist for turning fork over when attempting to eat various textures.  In pt's room, completed stand pivot transfer to padded tub bench with +2 assist using RW. Increased assist required to advance R LE and for RW management. Mod A to stand from w/[name] and padded tub bench. [facility] is becoming more functional and may soon become option for clothing management/ hygiene, however, transfer is not yet at level to teach family. Also discussed with pt how his standing and transfer can flucuate based on fatigue, tone, etc and may not always be reliable- pt voiced understanding and agreement. Pt left in power w/[name] with recreation therapist present and pt participating in animal assisted therapy. Discussed with pt and pt's father regarding d/[name] planning, follow up care, and possibility for home eval if pt willing.   Therapy Documentation Precautions:  Precautions Precautions: Fall, Cervical Precaution Comments: collar for comfort ( Dr Ditty ordering soft collar 05/08/16) Required Braces or Orthoses: Cervical Brace Cervical Brace: For comfort Restrictions Weight Bearing  Restrictions: No Pain: Pain Assessment Pain Score: 3  ADL: ADL ADL Comments: See Functional Assessment Tool  See Function Navigator for Current Functional Status.   Therapy/Group: Individual Therapy  Edward [name] 06/05/2016, 7:06 AM

## 2016-06-06 ENCOUNTER — Inpatient Hospital Stay (HOSPITAL_COMMUNITY): Payer: Self-pay | Admitting: Physical Therapy

## 2016-06-06 ENCOUNTER — Inpatient Hospital Stay (HOSPITAL_COMMUNITY): Payer: Medicaid Other | Admitting: Occupational Therapy

## 2016-06-06 ENCOUNTER — Inpatient Hospital Stay (HOSPITAL_COMMUNITY): Payer: Medicaid Other

## 2016-06-06 MED ORDER — TAMSULOSIN HCL 0.4 MG PO CAPS
0.4000 mg | ORAL_CAPSULE | Freq: Every day | ORAL | Status: DC
  Administered 2016-06-07 – 2016-06-08 (×2): 0.4 mg via ORAL
  Filled 2016-06-06 (×3): qty 1

## 2016-06-06 NOTE — Progress Notes (Signed)
Occupational Therapy Session Note  Patient Details  Name: Edward HolmesJoseph G Birnie MRN: [id number] Date of Birth: 03/27/1981  Today's Date: 06/06/2016 OT Individual Time: [id number] and 1400-1500 OT Individual Time Calculation (min): 60 min and 60 min    Short Term Goals:Week 4:  OT Short Term Goal 1 (Week 4): [name] will provide assist for UB and LB dressing with pt directing care as part of caregiver training OT Short Term Goal 2 (Week 4): [facility] Hoyer lift with pt's family members assisting as part of caregiver training OT Short Term Goal 3 (Week 4): Pt will independently direct caregiver for proper set-up of meal tray and AE for self feeding task as part of d/c planning.   Skilled Therapeutic Interventions/Progress Updates:    Session One: Pt seen for OT session focusing on functional transfers and fine motor coordination. Pt in supine upon arrival, agreeable to tx session, voicing need to be stretched this morning. AAROM/ stretches performed to B LEs prior to OOB mobility for tone reduction. He transferred to EOB with mod A +1. Completed stand pivot transfer with RW and mod A. Total VCs provided for step sequencing and assist for weightshift to L when advancing weaker R LE. Assist required for controlled descent into chair.  Pt declining working on grooming tasks this session, desiring to work on wrist and finger movement. Today, pt demonstrates with improved wrist flexion/ extension advancing to the point to be functional for tenodesis grasp, L>R and flexion> extension. Worked at table top level for pt to practice picking up and moving foam block. Completed on B sides with education regarding functional uses of tenodesis grasp. Pt returned to room in power w/c mod I. Left with call bell in reach and father entering room as therapist exited.   Session Two: pt seen for OT session focusing on education, functional transfers, and core strengthening/ stability in  prep for functional tasks. Pt received asleep in power w/c, easily awoken and agreeable to tx session.  Time spent with PT superviso, OT, and pt discussing power w/c eval, seating/ cushion device, functional use/ need for AFOs, and d/c planning.  In therapy gym, pt completed stand pivot transfer to therapy mat with assist for controlled descent.  Seated on therapy mat, pt reclined back on wedge and required to come forward to neutral position for core strengthening/ stability. Assist required to manage LEs in 90 degree positioning due to increased tone with activity.  Upon sitting upright, pt required to hit beach ball back and forth with emphasis on UE ROM and extension.  Pt then completed lateral leans to R and L. 75% fully extended pt able to return to midline sitting with close supervision progressing to min A with fatigue.  He was left reclined on EOM at end of session with hand off to PT, family member and therapy tech present.   Therapy Documentation Precautions:  Precautions Precautions: Fall, Cervical Precaution Comments: collar for comfort ( Dr Ditty ordering soft collar 05/08/16) Required Braces or Orthoses: Cervical Brace Cervical Brace: For comfort Restrictions Weight Bearing Restrictions: No Pain: Pain Assessment Pain Assessment: 0-10 Pain Score: 2  Pain Location: Back Pain Descriptors / Indicators: Aching Pain Onset: On-going Pain Intervention(s): Medication (See eMAR), RN aware, repositioned ADL: ADL ADL Comments: See Functional Assessment Tool  See Function Navigator for Current Functional Status.   Therapy/Group: Individual Therapy  [name], Ellanie Oppedisano C 06/06/2016, 7:05 AM
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
## 2016-06-06 NOTE — Progress Notes (Signed)
Physical Therapy Session Note  Patient Details  Name: Edward [name] MRN: [id number] Date of Birth: 12/25/1980  Today's Date: 06/06/2016 PT Individual Time: 1500-1600 PT Individual Time Calculation (min): 60 min    Short Term Goals: Week 4:  PT Short Term Goal 1 (Week 4): Pt will perform bed mobility with modA PT Short Term Goal 2 (Week 4): Pt will perform stand pivot transfer with maxA +1 and LRAD PT Short Term Goal 3 (Week 4): Pt's family will demonstrate transfer bed <>w/c with maxA  PT Short Term Goal 4 (Week 4): Pt's family will demonstrate car transfer with +2A  Skilled Therapeutic Interventions/Progress Updates:    Patient on mat leaning back on wedge with father sitting nearby.  Participated in gait with RW with bilateral hand splints and bilateral AFO's performing sit to stand with max A for lifting and lowering help, and ambulated 21' with mod A facilitation for weight shift, for initating swing on R, for forward progression.  Patient performed stand pivot transfer with RW w/c > mat max A for help with lifting and lowering, but mod support for pivot with RW.  Patient sit<>practice from mat with and without RW (bilat HHA) for LE strength and cues for aiming with head/ant weight shift.  Patient stood with UE support and S x 60 sec.  Propelled w/c with S x 150'.  Practiced mat> w/c and car transfer with slide board max A for placing board, for placing feet, weight shift and mod A for scoot pivots along board.  Patient propelled back to room with S x 200+'.  Performed LE stretching in chair for ankle PF, hip extensors, IR's and knee flexors x 20-30 sec holds.  Patient left in w/c with father in room.   Therapy Documentation Precautions:  Precautions Precautions: Fall, Cervical Restrictions Weight Bearing Restrictions: No Pain: Pain Assessment Faces Pain Scale: Hurts even more Pain Type: Neuropathic pain Pain Location: Foot Pain Orientation: Right;Left Pain Descriptors /  Indicators: Burning Pain Onset: Progressive   See Function Navigator for Current Functional Status.   Therapy/Group: Individual Therapy  Edward [name] 06/06/2016, 4:43 PM

## 2016-06-06 NOTE — Progress Notes (Signed)
Physical Therapy Session Note  Patient Details  Name: Edward HolmesJoseph G [name] MRN: [id number] Date of Birth: 07/10/1981  Today's Date: 06/06/2016 PT Individual Time: 1050-1150 PT Individual Time Calculation (min): 60 min     Therapy Documentation Precautions:  Precautions Precautions: Fall, Cervical Precaution Comments: collar for comfort ( Dr Ditty ordering soft collar 05/08/16) Required Braces or Orthoses: Cervical Brace Cervical Brace: For comfort Restrictions Weight Bearing Restrictions: No Pain: Pain Assessment Pain Score: 1    Patient received seated in power wheelchair.  Patient session focused on optimizing wheelchair seating and positioning, discussion of pressure relief, power wheelchair options, and mat assessment.   Right ankle PROM (-15) degrees dorsiflexion; 50 degrees plantar flexion  Left ankle PROM (-10) degrees  dorsiflexion ; 55 degrees plantarflexion Left and right knee PROM 0-130 degrees   Modified Ashworth performed with patient:   Sustain clonus bilateral soleus 3/4 quadriceps bilaterally  1/4 hamstrings bilaterally  1/4 gastrocnemius bilaterally  Patient assessed supine and seated on mat patient presents with left pelvic obliquity, bilateral externally rotated hips left greater than right.    Squat pivot transfer to and from [facility] and mat mod assist  Short sit to and from supine on mat max assist   Patient provided multi layered foam base with fluid insert to maximize pelvic stability, achieve pelvic neutral position, allow for greater immersion and envelopment of coccyx and ischial tuberosities.    Patient receptive through session discussing wheelchair options and plans for formal wheelchair eval with [facility] from [facility]. Discussed at length power wheelchair bases and advantages and disadvantages of each.    See Function Navigator for Current Functional Status.   Therapy/Group: Individual Therapy  Merri RayWISHART,Kelley Polinsky J 06/06/2016, 1:43 PM

## 2016-06-07 ENCOUNTER — Inpatient Hospital Stay (HOSPITAL_COMMUNITY): Payer: Self-pay | Admitting: Occupational Therapy

## 2016-06-07 ENCOUNTER — Inpatient Hospital Stay (HOSPITAL_COMMUNITY): Payer: Self-pay | Admitting: Physical Therapy

## 2016-06-07 MED ORDER — TIZANIDINE HCL 2 MG PO TABS
2.0000 mg | ORAL_TABLET | Freq: Four times a day (QID) | ORAL | Status: DC
  Administered 2016-06-07 – 2016-06-10 (×15): 2 mg via ORAL
  Filled 2016-06-07 (×16): qty 1

## 2016-06-07 NOTE — Progress Notes (Signed)
Occupational Therapy Weekly Progress Note  Patient Details  Name: Edward [name] MRN: [id number] Date of Birth: August 09, 1980  Beginning of progress report period: May 31, 2016 End of progress report period: June 07, 2016  Today's Date: 06/07/2016 OT Individual Time: 8466-5993 and 1300-1430 OT Individual Time Calculation (min): 60 min and 90 min   Patient has met 2 of 2 short term goals. STG of Hoyer transfer to [facility] was d/c due to pt progress. Pt has made excellent functional gains this week regarding his standing and stand pivot transfers using RW with AFOs donned. He is able to self feed and complete grooming tasks with supervision/ set-up using AE including directing caregivers of proper set-up for AE. With use of power w/c, pt able to complete pressure relief independently.   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  Patient progressing toward long term goals..  Plan of care revisions: Due to pt's improved functional standing and transfer abilities, goals have been added including tub bench transfer and toilet transfer. Have added a standing goal as pt's standing balance/ endurance is now at functional level.  See POC for goal revisions.  OT Short Term Goals Week 4:  OT Short Term Goal 1 (Week 4): [name] will provide assist for UB and LB dressing with pt directing care as part of caregiver training OT Short Term Goal 1 - Progress (Week 4): Met OT Short Term Goal 2 (Week 4): Pt will transfer to [facility] utilizing [place] lift with pt's family members assisting as part of caregiver training OT Short Term Goal 2 - Progress (Week 4): Discontinued (comment) (Completed via sliding board with family. Will begin training stand pivots to [facility]) OT Short Term Goal 3 (Week 4): Pt will independently direct  caregiver for proper set-up of meal tray and AE for self feeding task as part of d/c planning.  OT Short Term Goal 3 - Progress (Week 4): Met Week 5:  OT Short Term Goal 1 (Week 5): STG=LTG due to LOS   Skilled Therapeutic Interventions/Progress Updates:    Session One: Pt seen for OT session focusing on functional transfers and education. Pt in supine upon arrival, agreeable to tx session, declining any pain. Discussed at length d/c planning focusing on ADL routine. Discussed pt bathing seated on BSC vs. In bed, toilet transfer methods (Hoyer vs sliding board vs stand pivot) etc.  Pt requested being stretched prior to OOB mobility, LE stretches/ ROM performed. B shoes and AFOs donned total A in supine. Pt transferred to EOB with mod A using hospital bed functions with HOB fully elevated. He completed stand pivot transfer EOB> BSC with assist to power up and controlled descent. With CGA, pt able to weight shift and advance LEs coordinating step pattern for transfer. UB dressing completed seated on BSC. Pt is distant supervision for sitting balance on padded tub bench with cut out, however, does not feel comfortable performing dynamic tasks seated on bench due to fear of falling/ sliding off. Pt would cont to benefit from practice on padded tub bench as this is a very functional position for ADL tasks. He completed stand pivot to power w/c in same manner as described above. Grooming completed seated in power w/c at sink. Pt able to come forward to access sink independently, an improvement from previous sessions.  Pt left seated in power  chair at end of session, all needs in reach.   Session Two: Pt seen for OT session focusing on functional transfers, self- feeding and family education. Pt in reclining power chair upon arrival, voicing need for assist to place hand held urinal. Pt's mother present, and reviewed need of caregivers to provide as much assist as they feel they can safely complete in prep for  upcoming d/c. Pt declined full showering task this session, however, agreeable to simulate showering task in ADL apartment using tub transfer bench. Completed stand pivot transfer w/c<> tub transfer bench. Assist required to manage B LEs over tub wall and positioning. Returned to w/c in same manner as described above. Educated pt and pt's mother regarding recommendation to have pt transfer into shower fully clothed and then stand in shower to manage clothing in order to reduce shearing on bottom when positioning as well as need for AFOs to be donned during all standing transfers.  Pt declined any further standing tasks this session due to fatigue. In therapy day room, worked on pt self feeding with emphasis on pt directing caregiver for proper set-up. Pt able to self feed using L UE with universal cuff, however, fatigues quickly. Therefore trialed self feeding using R UE with universal cuff. Pt required min A for use of R UE due to decreased control when bringing utensil to mouth. AAROM performed to B shoulders with emphasis on essentric control.  Pt returned to room mod I in power w/c at end of session, mother present.     Therapy Documentation Precautions:  Precautions Precautions: Fall, Cervical Precaution Comments: collar for comfort ( Dr Ditty ordering soft collar 05/08/16) Required Braces or Orthoses: Cervical Brace Cervical Brace: For comfort Restrictions Weight Bearing Restrictions: No ADL: ADL ADL Comments: See Functional Assessment Tool  See Function Navigator for Current Functional Status.   Therapy/Group: Individual Therapy  [name], Jerelle Virden C 06/07/2016, 7:07 AM

## 2016-06-07 NOTE — Progress Notes (Signed)
Orthopedic Tech Progress Note Patient Details:  [name] 05/11/1981 [id number]  Patient ID: [name], male   DOB: 09/29/1980, 35 y.o.   MRN: [id number]   Nikki DomCrawford, Rockie Vawter 06/07/2016, 3:51 PM Called in advanced [facility] order[facility]
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
## 2016-06-07 NOTE — Plan of Care (Signed)
Problem: RH Toileting Goal: LTG Patient will perform toileting w/assist, cues/equip (OT) LTG: Patient will perform toiletiing (clothes management/hygiene) with assist, with/without cues using equipment (OT)  Complete toileting task total A sit <> stand while directing caregivers for assist.   Problem: RH Toilet Transfers Goal: LTG Patient will perform toilet transfers w/assist (OT) LTG: Patient will perform toilet transfers with assist, with/without cues using equipment (OT)  Complete BSC transfer via stand pivot with RW  Problem: RH Tub/Shower Transfers Goal: LTG Patient will perform tub/shower transfers w/assist (OT) LTG: Patient will perform tub/shower transfers with assist, with/without cues using equipment (OT)  Complete stand pivot with RW to tub transfer bench.   Comments: Goals have been upgraded due to pt progress. See POC for goal details

## 2016-06-07 NOTE — Progress Notes (Signed)
Physical Therapy Session Note  Patient Details  Name: Edward HolmesJoseph G Simone MRN: [id number] Date of Birth: 11/16/1980  Today's Date: 06/07/2016 PT Individual Time: 1000-1130 PT Individual Time Calculation (min): 90 min    Short Term Goals: Week 4:  PT Short Term Goal 1 (Week 4): Pt will perform bed mobility with modA PT Short Term Goal 2 (Week 4): Pt will perform stand pivot transfer with maxA +1 and LRAD PT Short Term Goal 3 (Week 4): Pt's family will demonstrate transfer bed <>w/c with maxA  PT Short Term Goal 4 (Week 4): Pt's family will demonstrate car transfer with +2A  Skilled Therapeutic Interventions/Progress Updates:   Pt received in w/c, denies pain and agreeable to treatment. Session with focus on functional mobility including transfers and bed mobility to participate in w/c assessment with w/c vendor Josh Cadle, [employer]. Educated pt on various options for power seating/mobility and purpose/goals of each feature. Performed transfers with modA using transfer board, and mod/maxA bed mobility due to tone. BLE PLS AFO's donned totalA. Stand pivot transfer with RW and modA sit >stand, minA for pivotal steps. Gait x20' with RW and minA with increased cueing needed for pivoting steps to finish at w/c. Remained seated in w/c at end of session, all needs in reach.   Therapy Documentation Precautions:  Precautions Precautions: Fall, Cervical Precaution Comments: collar for comfort ( Dr Ditty ordering soft collar 05/08/16) Required Braces or Orthoses: Cervical Brace Cervical Brace: For comfort Restrictions Weight Bearing Restrictions: No   See Function Navigator for Current Functional Status.   Therapy/Group: Individual Therapy  Vista Lawmanlizabeth J Tygielski 06/07/2016, 12:53 PM
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
## 2016-06-09 MED ORDER — CLONAZEPAM 0.5 MG PO TABS
0.5000 mg | ORAL_TABLET | Freq: Two times a day (BID) | ORAL | Status: DC | PRN
  Administered 2016-06-09 – 2016-06-14 (×8): 0.5 mg via ORAL
  Filled 2016-06-09 (×8): qty 1
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
## 2016-06-10 ENCOUNTER — Inpatient Hospital Stay (HOSPITAL_COMMUNITY): Payer: Self-pay | Admitting: Occupational Therapy

## 2016-06-10 ENCOUNTER — Inpatient Hospital Stay (HOSPITAL_COMMUNITY): Payer: Medicaid Other

## 2016-06-10 ENCOUNTER — Inpatient Hospital Stay (HOSPITAL_COMMUNITY): Payer: Self-pay | Admitting: Physical Therapy

## 2016-06-10 MED ORDER — RIVAROXABAN 10 MG PO TABS
10.0000 mg | ORAL_TABLET | Freq: Every day | ORAL | Status: DC
  Administered 2016-06-10 – 2016-06-14 (×5): 10 mg via ORAL
  Filled 2016-06-10 (×6): qty 1

## 2016-06-10 NOTE — Discharge Instructions (Addendum)
Inpatient Rehab Discharge Instructions  Edward HolmesJoseph G [name] Discharge date and time: No discharge date for patient encounter.   Activities/Precautions/ Functional Status: Activity: Cervical collar for comfort Diet: regular diet Wound Care: none needed Functional status:  ___ No restrictions     ___ Walk up steps independently ___ 24/7 supervision/assistance   ___ Walk up steps with assistance ___ Intermittent supervision/assistance  ___ Bathe/dress independently ___ Walk with walker     _x__ Bathe/dress with assistance ___ Walk Independently    ___ Shower independently ___ Walk with assistance    ___ Shower with assistance ___ No alcohol     ___ Return to work/school ________    COMMUNITY REFERRALS UPON DISCHARGE:    Home Health:   PT    OT     RN                     Agency:  [facility] Phone: [phone]   Medical Equipment/Items Ordered: wheelchair, cushion, padded tub bench/commode, transfer board                                                     Agency/Supplier:  [facility] @ [phone]  Other: Briefs and bed pads - [employer] @ [phone]    GENERAL COMMUNITY RESOURCES FOR PATIENT/FAMILY:  Support Groups: website - www.[email or web address].org         Special Instructions:  In and out catheterizations for neurogenic bladder as directed  My questions have been answered and I understand these instructions. I will adhere to these goals and the provided educational materials after my discharge from the hospital.  Patient/Caregiver Signature _______________________________ Date __________  Clinician Signature _______________________________________ Date __________  Please bring this form and your medication list with you to all your follow-up doctor's appointments.    Information on my medicine - XARELTO (Rivaroxaban)  This medication education was reviewed with me or my healthcare representative as part of my discharge preparation.    Why  was Xarelto prescribed for you? Xarelto was prescribed for you to reduce the risk of blood clots forming after orthopedic surgery. The medical term for these abnormal blood clots is venous thromboembolism (VTE).  What do you need to know about xarelto ? Take your Xarelto 10 mg ONCE DAILY at the same time every day. You may take it either with or without food.  If you have difficulty swallowing the tablet whole, you may crush it and mix in applesauce just prior to taking your dose.  Take Xarelto exactly as prescribed by your doctor and DO NOT stop taking Xarelto without talking to the doctor who prescribed the medication.  Stopping without other VTE prevention medication to take the place of Xarelto may increase your risk of developing a clot.  After discharge, you should have regular check-up appointments with your healthcare provider that is prescribing your Xarelto.    What do you do if you miss a dose? If you miss a dose, take it as soon as you remember on the same day then continue your regularly scheduled once daily regimen the next day. Do not take two doses of Xarelto on the same day.   Important Safety Information A possible side effect of Xarelto is bleeding. You should call your healthcare provider right away if you experience any of the following: ? Bleeding  from an injury or your nose that does not stop. ? Unusual colored urine (red or dark brown) or unusual colored stools (red or black). ? Unusual bruising for unknown reasons. ? A serious fall or if you hit your head (even if there is no bleeding).  Some medicines may interact with Xarelto and might increase your risk of bleeding while on Xarelto. To help avoid this, consult your healthcare provider or pharmacist prior to using any new prescription or non-prescription medications, including herbals, vitamins, non-steroidal anti-inflammatory drugs (NSAIDs) and supplements.  This website has more information on  Xarelto: [email or web address].
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
## 2016-06-10 NOTE — Progress Notes (Signed)
Occupational Therapy Session Note  Patient Details  Name: ABDULLAHI VALLONE MRN: [id number] Date of Birth: May 13, 1981  Today's Date: 06/10/2016 OT Individual Time: 0700-0815 OT Individual Time Calculation (min): 75 min     Short Term Goals:Week 5: STG= LTG due to LOS Skilled Therapeutic Interventions/Progress Updates:    Pt seen for OT session focusing on caregiver training with pt's fiancee, Patty. Pt in supine upon arrival, voicing increased fatigue, however, willing to participate in tx sssion.  AFOs donned total A in supine with education to [facility] regarding donning techniques.  Using hospital bed functions, Patty transferred pt to EOB, min VCs provided for technique/ safety awareness. Therapist demonstrated stand pivot transfer EOB> power w/c. Pt required max A to stand from EOB and min A for pivot portion of transfer, assist required for controlled descent into chair.  Pt's wife provided assist for stand pivot transfers x2 with verbal and demostration cues for how to assist with sit <> stand. Educated regarding importance of communication during transfers, maintaining good body mechanics when assisting, and need for AFOs during all standing/ transfer. Discussed throughout session home routine including positioning for bathing/dressing, technique/ DME for tub/shower transfer, HHOT, importance of participation with ADLs, HEP, benefits of mobility, and d/c planning.  Pt left in power w/c at end of session, Patty present and all needs met.    Therapy Documentation Precautions:  Precautions Precautions: Fall, Cervical Precaution Comments: collar for comfort ( Dr Ditty ordering soft collar 05/08/16) Required Braces or Orthoses: Cervical Brace Cervical Brace: For comfort Restrictions Weight Bearing Restrictions: No ADL: ADL ADL Comments: See Functional Assessment Tool  See Function Navigator for Current Functional Status.   Therapy/Group: Individual Therapy  [name], Asuka Dusseau  C 06/10/2016, 6:40 AM

## 2016-06-10 NOTE — Progress Notes (Signed)
Occupational Therapy Note  Patient Details  Name: Edward [name] MRN: [id number] Date of Birth: 09/25/1980  Today's Date: 06/10/2016 OT Individual Time: 1400-1430 OT Individual Time Calculation (min): 30 min    Pt denied pain Individual Therapy  Pt resting in w/c upon arrival and transitioned to gym.  Discussed/demonstrated HEP, ROM, and continued discharge planning.  Pt agreeable to shower in ADL apartment before discharge Saturday. Pt returned to room with family present.    [name], Edward [name] 06/10/2016, 2:59 PM

## 2016-06-10 NOTE — Progress Notes (Signed)
Physical Therapy Weekly Progress Note  Patient Details  Name: Edward [name] MRN: [id number] Date of Birth: 1980/12/29  Beginning of progress report period: June 03, 2016 End of progress report period: June 10, 2016  Today's Date: 06/10/2016 PT Individual Time: 1275-1700 and 1300-1400 PT Individual Time Calculation (min): 45 min and 60 min (total 105 min)    Patient has met 3 of 4 short term goals.  Pt currently requires mod/maxA for bed mobility depending on bed setup and fluctuating tone throughout the day. Stand pivot transfers with RW becoming more consistent with modA for sit >stand and minA for pivotal steps to next surface, much improved when AFOs donned. Gait continues to be non-functional with short step length, scissoring, and occasional LOB however improving daily. Orthotist consulted for AFO's to A with foot clearance and knee stability during stand pivot transfers to reduce burden of care. Plan for family education on Friday of this week when pt's SO is able to be off work, with hands-on education on-going with pt's SO and OT in the mornings before she goes to work.   Patient continues to demonstrate the following deficits:  activity tolerance, balance, postural control, ability to compensate for deficits, functional use of  right upper extremity, right lower extremity, left upper extremity and left lower extremity and coordination and therefore will continue to benefit from skilled PT intervention to enhance overall performance with bed mobility, transfers, gait, home and community access.   Patient progressing toward long term goals..  Continue plan of care.  PT Short Term Goals Week 4:  PT Short Term Goal 1 (Week 4): Pt will perform bed mobility with modA PT Short Term Goal 1 - Progress (Week 4): Met PT Short Term Goal 2 (Week 4): Pt will perform stand pivot transfer with maxA +1 and LRAD PT Short Term Goal 2 - Progress (Week 4): Met PT Short Term Goal 3 (Week 4):  Pt's family will demonstrate transfer bed <>w/c with maxA  PT Short Term Goal 3 - Progress (Week 4): Met PT Short Term Goal 4 (Week 4): Pt's family will demonstrate car transfer with +2A PT Short Term Goal 4 - Progress (Week 4): Met Week 5:  PT Short Term Goal 1 (Week 5): =LTG due to estimated LOS   Skilled Therapeutic Interventions/Progress Updates:   Tx 1: Pt received seated in w/c, denies pain and agreeable to treatment. Transport to/from gym in power w/c with modI. Stand pivot transfer w/c <>car with RW and maxA lifting/lowering, and minA for dynamic standing balance with pivotal steps; performed without AFO's due to pt request and note increase in difficulty progressing LEs due to plantarflexion spasticity. Required totalA for managing LE's in/out of car due to extensor tone. Returned to room as above. Sit <>stand x3 reps from power w/c with modA for boosting up, and minA lowering with cues for forward trunk flexion to A with eccentric control to sit. Static standing balance with close S and RW. Pt handed cup of water; able to hold cup and bring straw to mouth with LUE and hand splint, then place cup on bedside table without assist. Encouraged pt to continue working on functional reaching tasks. Remained seated in w/c at end of session, all needs in reach.   Tx 2: Pt received seated in w/c, denies pain and agreeable to treatment. Stand pivot transfer w/c >mat table with RW and modA sit >stand and minA pivotal steps. Seated anterior weight shifts with BUEs on stability ball and facilitation for  LE activation to clear hips off table. Carry over to sit >stand using BUEs on RW to improve anterior weight shift and decrease reliance of LEs on mat table. Orthotist present for evaluation for AFOs; performed gait x15' with no AFOs and min guard; note significant recurvatum BLE R worse than L. Second gait trial with LLE PLS with increased stiffness to reduce recurvatum, and noted significant improvement in LLE.  Orthotist recommended custom molded RLE AFO due to severe recurvatum. Returned to room in w/c modI, remained seated in w/c with handoff to OT.   Therapy Documentation Precautions:  Precautions Precautions: Fall, Cervical Precaution Comments: collar for comfort ( Dr Ditty ordering soft collar 05/08/16) Required Braces or Orthoses: Cervical Brace Cervical Brace: For comfort Restrictions Weight Bearing Restrictions: No   See Function Navigator for Current Functional Status.  Therapy/Group: Individual Therapy  Luberta Mutter 06/10/2016, 10:13 AM

## 2016-06-11 ENCOUNTER — Inpatient Hospital Stay (HOSPITAL_COMMUNITY): Payer: Medicaid Other

## 2016-06-11 ENCOUNTER — Inpatient Hospital Stay (HOSPITAL_COMMUNITY): Payer: Self-pay | Admitting: Physical Therapy

## 2016-06-11 MED ORDER — TIZANIDINE HCL 4 MG PO TABS
4.0000 mg | ORAL_TABLET | Freq: Three times a day (TID) | ORAL | Status: DC
  Administered 2016-06-11 – 2016-06-15 (×13): 4 mg via ORAL
  Filled 2016-06-11 (×13): qty 1
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
## 2016-06-11 NOTE — Progress Notes (Signed)
Physical Therapy Session Note  Patient Details  Name: Edward [name] MRN: [id number] Date of Birth: 03/04/1981  Today's Date: 06/11/2016 PT Individual Time: 1430-1545 PT Individual Time Calculation (min): 75 min    Short Term Goals: Week 5:  PT Short Term Goal 1 (Week 5): =LTG due to estimated LOS  Skilled Therapeutic Interventions/Progress Updates:   Pt received seated in w/c, c/o pain as below and agreeable to treatment. Transport to/from gym modI in power w/c. Orthotist present with LLE PLS and RLE custom fit solid AFO. Therapist and orthotist educated pt in fit, comfort, and goals of AFO use. Repositioning in w/c and preparing for sit>stand done with minA for removing footrests and donning straps on hand splints, while pt doffed seat belt, scooted hips forward, and aligns hands in BUE hand splints on walker without assist. Sit >stand modA. Gait x30' including turns with min guard faded to close S overall. Second trial 11050' without turns with modA sit >stand, and S for entire ambulation trial. Gait continues to be non-functional with significantly slowed gait speed, high energy consumption during task, and evidence of fatigue after two trials with noted increase in clonus. Discussed equipment to be ordered for home; pt will not qualify for RW due to non-functional ambulator and power w/c already ordered. Family declined CSW ordering RW through our vendor, preferred to find equipment on their own. Pt and fiance decline hoyer lift as they report they have a small house and it would not be functional with their bed; therapist agrees with decision as pt can perform stand pivot vs slide board based on pt/caregiver fatigue. Ordered slide board with CSW to prepare for d/c. Plan for formal family education on Friday 11/24 to include car transfer to family car. Also discussed recommendation with pt to cease use of maximove and perform all transfers with either fiance assisting, or pt directing care with  nursing staff for slide board or stand pivot transfer; pt and SO agreeable. Pt remained seated in w/c at end of session, all needs in reach.   Therapy Documentation Precautions:  Precautions Precautions: Fall, Cervical Precaution Comments: collar for comfort ( Dr Ditty ordering soft collar 05/08/16) Required Braces or Orthoses: Cervical Brace Cervical Brace: For comfort Restrictions Weight Bearing Restrictions: No Pain: Pain Assessment Pain Assessment: 0-10 Pain Score: 4  Pain Type: Chronic pain Pain Location: Generalized Pain Radiating Towards: all over Pain Descriptors / Indicators: Aching;Burning Pain Frequency: Constant Pain Onset: On-going Patients Stated Pain Goal: 2 Pain Intervention(s): Medication (See eMAR)   See Function Navigator for Current Functional Status.   Therapy/Group: Individual Therapy  Vista Lawmanlizabeth J Tygielski 06/11/2016, 3:47 PM

## 2016-06-11 NOTE — Progress Notes (Signed)
Occupational Therapy Note  Patient Details  Name: Edward [name] MRN: [id number] Date of Birth: 05/09/1981  Today's Date: 06/11/2016 OT Individual Time: 1300-1400 OT Individual Time Calculation (min): 60 min    Pt denied pain but c/o stiffness; stretching/ROM and repositioned Individual therapy  Pt asleep in w/c upon arrival but easily aroused.  Initially discussed bathing plans for tomorrow (shower in tub room).  Pt agreeable. Session focus on planning for tomorrow and attempted to practice tub shower transfer.  Pt performs sit<>stand with mod A and stand pivot transfer with steady A.  Pt experienced increased BLE tone when attempting to swing BLE into tub.  Increased tone translated to hips/UB and task aborted secondary to safety concerns.  Educated pt on home safety and not taking unnecessary risks during transfers.  Pt verbalized understanding.  Pt returned to room and remained in w/c.  AFOs removed per pt request.  Pt verbalized understanding that his AFOs should be donned for transfers.   Edward [name], Edward [name] 06/11/2016, 2:11 PM

## 2016-06-11 NOTE — Progress Notes (Signed)
Physical Therapy Note  Patient Details  Name: Edward [name] MRN: [id number] Date of Birth: 04/04/1981 Today's Date: 06/11/2016    Time: 1100-1153 53 minutes  1:1 No c/o pain.  Pt able to propel power w/c throughout unit and room with mod I.  Gait training 2 x 20' with RW with mod A for sit to stand, min A for balance with gait. Pt with 1 posterior LOB requiring min A to correct.  Sit to stand training multiple attempts with mod A, pt requires cues for eccentric control with sitting.  Car transfer with stand pivot with RW with mod A to stand from car, good w/c set up without cuing. Pt with good motivation to participate throughout session.   DONAWERTH,KAREN 06/11/2016, 11:53 AM

## 2016-06-11 NOTE — Progress Notes (Signed)
Occupational Therapy Session Note  Patient Details  Name: Edward [name] MRN: [id number] Date of Birth: 12/16/1980  Today's Date: 06/11/2016 OT Individual Time: 0900-1000 OT Individual Time Calculation (min): 60 min     Short Term Goals: Week 5:  OT Short Term Goal 1 (Week 5): STG=LTG due to LOS  Skilled Therapeutic Interventions/Progress Updates:    Focus on LB dressing tasks, bed mobility, self feeding, and continued discharge planning.  Pt independent with directing care.  Emphasized importance of directing care.  Pt verbalized understanding.  Pt performs sit<>stand with mod A and stand pivot transfer with steady A.  Pt incorporates self talk during transfers appropriately.  Pt directed applying L universal cuff for self feeding.  Pt completed self feeding at setup level.  Therapy Documentation Precautions:  Precautions Precautions: Fall, Cervical Precaution Comments: collar for comfort ( Dr Ditty ordering soft collar 05/08/16) Required Braces or Orthoses: Cervical Brace Cervical Brace: For comfort Restrictions Weight Bearing Restrictions: No   Pain: Pain Assessment Pain Assessment: 0-10 Pain Score: 3  Pain Location: Back Pain Orientation: Mid;Lower Pain Descriptors / Indicators: Aching Patients Stated Pain Goal: 2 Pain Intervention(s): Medications admin during session    See Function Navigator for Current Functional Status.   Therapy/Group: Individual Therapy  Edward [name], Edward [name] 06/11/2016, 10:02 AM
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
## 2016-06-12 ENCOUNTER — Inpatient Hospital Stay (HOSPITAL_COMMUNITY): Payer: Medicaid Other | Admitting: Occupational Therapy

## 2016-06-12 ENCOUNTER — Inpatient Hospital Stay (HOSPITAL_COMMUNITY): Payer: Self-pay | Admitting: Physical Therapy

## 2016-06-12 ENCOUNTER — Inpatient Hospital Stay (HOSPITAL_COMMUNITY): Payer: Medicaid Other
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
## 2016-06-12 NOTE — Progress Notes (Signed)
Physical Therapy Session Note  Patient Details  Name: Edward [name] MRN: [id number] Date of Birth: 08/25/1980  Today's Date: 06/12/2016 PT Individual Time: 1100-1200 and 1400-1500 PT Individual Time Calculation (min): 60 min and 60 min (total 120 min)    Short Term Goals: Week 5:  PT Short Term Goal 1 (Week 5): =LTG due to estimated LOS  Skilled Therapeutic Interventions/Progress Updates:   Tx 1: Pt received seated in w/c, denies pain but does report increased soreness and spasticity today. Declines standing/ambulation at this time. Session with focus on functional mobility using power w/c in community setting. Pt requires min verbal cues for navigation in tight spaces and assist with reaching for and carrying items in gift shop due to decreased dexterity and UE ROM from w/c level. Educated pt on energy conservation based on how he is feeling each day with attention paid to recovery for a day or two following a day with increased activity/exertion. Pt remained seated in w/c at end of session, all needs in reach.   Tx 2:Pt received in w/c, denies pain and agreeable to treatment including gait. BLE AFOs donned totalA. ModA sit >stand. Gait x30' including two turns with close S; min cues for upright posture and forward gaze. Pt reports feeling more incoordinated today due to fatigue, soreness. Discussed muscle recovery from activity and importance of continuing to perform mobility activities as able to maintain and continue progressing. Lateral scoot transfer w/c <>nustep with transfer board and modA. Nustep x7 min with BLE continuously, occasional use of BUEs for A; performed for BLE/BUE coordination. Returned to room modI in power w/c at end of session, all needs in reach.   Therapy Documentation Precautions:  Precautions Precautions: Fall, Cervical Precaution Comments: collar for comfort ( Dr Ditty ordering soft collar 05/08/16) Required Braces or Orthoses: Cervical Brace Cervical Brace: For  comfort Restrictions Weight Bearing Restrictions: No   See Function Navigator for Current Functional Status.   Therapy/Group: Individual Therapy  Vista Lawmanlizabeth J Tygielski 06/12/2016, 12:13 PM

## 2016-06-12 NOTE — Progress Notes (Signed)
Occupational Therapy Session Note  Patient Details  Name: Edward [name] MRN: [id number] Date of Birth: 07/14/1981  Today's Date: 06/12/2016 OT Individual Time: 0900-1030 OT Individual Time Calculation (min): 90 min     Short Term Goals: Week 5:  OT Short Term Goal 1 (Week 5): STG=LTG due to LOS  Skilled Therapeutic Interventions/Progress Updates:    Pt resting in bed upon arrival.  Initial plan was to shower at tub level but pt's increased tone and stiffness prohibited task this morning.  Discussed with pt importance of stretching prior to activity and safety at home.  Pt verbalized understanding.  Attempted standing for pt to initiate pulling up pants.  Pt required BUE support with standing this morning and required tot A to pull up pants.  Pt requested use of sliding board for transfers this morning secondary to increased stiffness  Discussed importance of communication and self advocacy.  Pt in agreement.  Pt communicates and advocates well.  Pt completed UB bathing and dressing tasks at w/c level.  UB stretching performed while seated in w/c.  Pt required more than a reasonable amount of time to complete tasks this morning.  Pt stated that he "probabaly overdid it" the previous day.  Pt completed grooming tasks at sink with setup and assistance with bringing cup to his mouth to rinse.  Pt remained in w/c with all needs within reach.  Therapy Documentation Precautions:  Precautions Precautions: Fall, Cervical Precaution Comments: collar for comfort ( Dr Ditty ordering soft collar 05/08/16) Required Braces or Orthoses: Cervical Brace Cervical Brace: For comfort Restrictions Weight Bearing Restrictions: No   Pain:  Increased tone and "stiffness" limiting transfers and sit<>stand ADL: ADL ADL Comments: See Functional Assessment Tool Exercises:   Other Treatments:    See Function Navigator for Current Functional Status.   Therapy/Group: Individual Therapy  Edward [name], Edward [name]  [name] 06/12/2016, 11:18 AM

## 2016-06-12 NOTE — Progress Notes (Signed)
This Clinical research associatewriter discussed BTP with pt and partner. This am partner did not assist w BTP and suppository insertion, was awake but then went to bathroom while this Clinical research associatewriter performed Tx. This evening partner is receptive to assisting with BTP in am of 06/13/16.

## 2016-06-12 NOTE — Progress Notes (Signed)
Social Work Patient ID: Edward [name], male   DOB: 03-09-1981, 35 y.o.   MRN: [id number]   Met with pt and girlfriend yesterday to review team conference.  Both feeling they are ready for d/c end of the week.  Review of DME and HH follow up needs.  No concerns at this time.  Continue to follow.  Shadawn Hanaway, LCSW

## 2016-06-12 NOTE — Progress Notes (Signed)
Recreational Therapy Discharge Summary Patient Details  Name: Edward [name] MRN: [id number] Date of Birth: 1980-08-26 Today's Date: 06/12/2016  Long term goals set: 2  Long term goals met: 2  Comments on progress toward goals: Pt has made excellent progress during LOS and is ready for discharge home with family to provide/coordinate 24 hour care.  Pt is discharging home at [facility] assist level for simple TR tasks needing set up, cuing and extra time to complete tasks.    Reasons for discharge: discharge from hospital  Patient/family agrees with progress made and goals achieved: Yes     Session Note:  06/12/16 Pain: no c/o, c/o soreness from therapy yesterday  Session focused on community pursuits including identification/negotiation of obstacles, problem solving, UE use from power w/c level with supervision for mobility. Pt required assistance to retrieve items from shelves, but able to position grasp items using hands and arms during transport with set up/supervision.  Pt directing his care throughout session.  Everleigh Colclasure 06/12/2016, 8:28 AM

## 2016-06-12 NOTE — Progress Notes (Signed)
Occupational Therapy Session Note  Patient Details  Name: Edward [name] MRN: [id number] Date of Birth: 02/04/1981  Today's Date: 06/12/2016 OT Individual Time: 1500-1608 OT Individual Time Calculation (min): 68 min     Short Term Goals: Week 5:  OT Short Term Goal 1 (Week 5): STG=LTG due to LOS  Skilled Therapeutic Interventions/Progress Updates:     Upon entering the room, pt seated in wheelchair with no c/o pain this session. Pt verbally directed transfer with increased time. Slide board transfer with mod A from wheelchair >bed. Pt required mod A for B LEs for sit >supine. OT providing pt with handouts for B UE PROM stretching for digits,wrist,elbow, and shoulder in all planes. OT stretching pt x 10 reps in each direction in order to decrease tone. Paper handout left for family and will continue to be addressed. Pt returned to wheelchair in same manner as above. Seat belt on and call bell within reach upon exiting the room.   Therapy Documentation Precautions:  Precautions Precautions: Fall, Cervical Precaution Comments: collar for comfort ( Dr Ditty ordering soft collar 05/08/16) Required Braces or Orthoses: Cervical Brace Cervical Brace: For comfort Restrictions Weight Bearing Restrictions: No General:   Vital Signs: Therapy Vitals Temp: 98.6 F (37 C) Temp Source: Oral Pulse Rate: 72 Resp: 16 BP: (!) 114/53 Patient Position (if appropriate): Lying Oxygen Therapy SpO2: 98 % O2 Device: Not Delivered Pain: Pain Assessment Pain Assessment: 0-10 Pain Score: 2  Pain Type: Chronic pain Pain Location: Generalized Pain Radiating Towards: all over Pain Descriptors / Indicators: Burning Pain Frequency: Constant Pain Onset: On-going Patients Stated Pain Goal: 1 Pain Intervention(s): Medication (See eMAR) ADL: ADL ADL Comments: See Functional Assessment Tool  See Function Navigator for Current Functional Status.   Therapy/Group: Individual Therapy  Edward [name],  Edward [name] P 06/12/2016, 4:12 PM
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
## 2016-06-13 NOTE — Progress Notes (Signed)
RN witness and teach back with girlfriend administering the suppository correctly to the pt. Pt was comfortable and understood the reason and teaching. Girlfriend was comfortable and understood the importance of keep him on the bowel regimen.

## 2016-06-14 ENCOUNTER — Ambulatory Visit (HOSPITAL_COMMUNITY): Payer: Self-pay | Admitting: Physical Therapy

## 2016-06-14 ENCOUNTER — Inpatient Hospital Stay (HOSPITAL_COMMUNITY): Payer: Medicaid Other

## 2016-06-14 MED ORDER — POLYETHYLENE GLYCOL 3350 17 G PO PACK: 17.0000 g | PACK | Freq: Every day | ORAL | 0 refills | Status: DC

## 2016-06-14 MED ORDER — OXYCODONE HCL ER 20 MG PO T12A: 20.0000 mg | EXTENDED_RELEASE_TABLET | Freq: Two times a day (BID) | ORAL | 0 refills | Status: DC

## 2016-06-14 MED ORDER — METHOCARBAMOL 500 MG PO TABS: 500.0000 mg | ORAL_TABLET | Freq: Four times a day (QID) | ORAL | 0 refills | Status: DC | PRN

## 2016-06-14 MED ORDER — BACLOFEN 20 MG PO TABS: 20.0000 mg | ORAL_TABLET | Freq: Four times a day (QID) | ORAL | 0 refills | Status: DC

## 2016-06-14 MED ORDER — MELATONIN 3 MG PO TABS: 6.0000 mg | ORAL_TABLET | Freq: Every day | ORAL | 0 refills | Status: AC

## 2016-06-14 MED ORDER — PANTOPRAZOLE SODIUM 20 MG PO TBEC: 20.0000 mg | DELAYED_RELEASE_TABLET | Freq: Every day | ORAL | 0 refills | Status: DC

## 2016-06-14 MED ORDER — SENNOSIDES-DOCUSATE SODIUM 8.6-50 MG PO TABS: 2.0000 | ORAL_TABLET | ORAL | 0 refills | Status: DC

## 2016-06-14 MED ORDER — CALCIUM POLYCARBOPHIL 625 MG PO TABS: 625.0000 mg | ORAL_TABLET | Freq: Every day | ORAL | 0 refills | Status: DC

## 2016-06-14 MED ORDER — RIVAROXABAN 10 MG PO TABS: 10.0000 mg | ORAL_TABLET | Freq: Every day | ORAL | 0 refills | Status: DC

## 2016-06-14 MED ORDER — OXYCODONE HCL 5 MG PO TABS: 5.0000 mg | ORAL_TABLET | Freq: Four times a day (QID) | ORAL | 0 refills | Status: DC | PRN

## 2016-06-14 MED ORDER — GABAPENTIN 300 MG PO CAPS: 600.0000 mg | ORAL_CAPSULE | Freq: Three times a day (TID) | ORAL | 0 refills | Status: DC

## 2016-06-14 MED ORDER — TIZANIDINE HCL 4 MG PO TABS: 4.0000 mg | ORAL_TABLET | Freq: Three times a day (TID) | ORAL | 0 refills | Status: DC

## 2016-06-14 NOTE — Progress Notes (Signed)
Occupational Therapy Note  Patient Details  Name: Edward [name] MRN: [id number] Date of Birth: 04/25/1981  Today's Date: 06/14/2016 OT Individual Time: 1400-1430 OT Individual Time Calculation (min): 30 min    Pt denied pain Individual Therapy  Pt resting in w/c upon arrival.  Pt's SO ([name]) present.  Ongoing education regarding daily schedule, bowel program, stretching/ROM, transfers, and safety.  [name] return demonstrated BUE stretching/ROM. Pt and [name] pleased with progress and, although anxious, ready for discharge tomorrow.   [name], [name] [name] 06/14/2016, 2:48 PM

## 2016-06-14 NOTE — Progress Notes (Signed)
Occupational Therapy Discharge Summary  Patient Details  Name: Edward [name] MRN: [id number] Date of Birth: Dec 06, 1980  Patient has met 11 of 11 long term goals due to improved activity tolerance, improved balance, postural control, ability to compensate for deficits, functional use of  RIGHT upper, RIGHT lower, LEFT upper and LEFT lower extremity and improved coordination.  Pt made steady progress with BADLs during this admission.  Pt completes UB bathing/dressing tasks seated in w/c and directs care/assistance appropriately.  Pt performs stand pivot transfers with RW (bilateral AFOs donned) to BSC/padded tub bench with cutout for toileting and tub transfers.  Pt prequires max a for placing BLE into tub with increased assistance to maintain safe position while seated during transfer.  Pt and SO ([name]) instructed to practice with home therapy prior to attempting at home for shower.  Pt's SO has been educated and participated in functional transfers, BADLs at bed level and seated in w/c, BUE stretching/ROM and general education regarding bowel program and establishing a schedule at home.  Pt and SO verbalized understanding of all recommendations. Patient to discharge at overall min to max A level.  Patient's care partner is independent to provide the necessary physical assistance at discharge.    Recommendation:  Patient will benefit from ongoing skilled OT services in home health setting to continue to advance functional skills in the area of BADL and Reduce care partner burden.  Equipment: padded tub transfer bench (cutout)  Reasons for discharge: treatment goals met and discharge from hospital  Patient/family agrees with progress made and goals achieved: Yes  OT Discharge Vision/Perception  Vision- History Baseline Vision/History: No visual deficits Patient Visual Report: No change from baseline Vision- Assessment Vision Assessment?: No apparent visual deficits Perception Comments: WFL   Cognition Overall Cognitive Status: Within Functional Limits for tasks assessed Arousal/Alertness: Awake/alert Orientation Level: Oriented X4 Attention: Alternating;Sustained Sustained Attention: Appears intact Alternating Attention: Appears intact Memory: Appears intact Awareness: Appears intact Problem Solving: Appears intact Safety/Judgment: Appears intact Sensation Sensation Light Touch: Impaired Detail Light Touch Impaired Details: Impaired RUE;Absent RUE Stereognosis: Impaired Detail Stereognosis Impaired Details: Absent RUE;Absent LUE Hot/Cold: Impaired Detail Hot/Cold Impaired Details: Impaired RUE Proprioception: Impaired Detail Proprioception Impaired Details: Impaired RLE;Impaired LLE Coordination Gross Motor Movements are Fluid and Coordinated: No Fine Motor Movements are Fluid and Coordinated: No Heel Shin Test: NT due to extensor tone Motor  Motor Motor: Abnormal tone;Abnormal postural alignment and control;Tetraplegia Motor - Discharge Observations: Tetraparesis, extensor tone/spasticity, sustained clonus BLE soleus Mobility  Bed Mobility Bed Mobility: Rolling Right;Rolling Left;Sit to Supine;Supine to Sit Rolling Right: 4: Min assist Rolling Right Details: Manual facilitation for weight shifting;Manual facilitation for placement;Manual facilitation for weight bearing;Verbal cues for technique Rolling Left: 4: Min assist Rolling Left Details: Manual facilitation for weight shifting;Manual facilitation for placement;Manual facilitation for weight bearing;Verbal cues for technique Supine to Sit: 3: Mod assist Supine to Sit Details: Manual facilitation for weight shifting;Manual facilitation for placement;Verbal cues for technique;Verbal cues for precautions/safety Sit to Supine: 3: Mod assist Sit to Supine - Details: Verbal cues for precautions/safety;Verbal cues for technique;Manual facilitation for weight shifting;Manual facilitation for  placement Transfers Stand to Sit: 3: Mod assist;With upper extremity assist Stand to Sit Details (indicate cue type and reason): Verbal cues for technique;Manual facilitation for placement;Manual facilitation for weight shifting  Balance Static Sitting Balance Static Sitting - Balance Support: Bilateral upper extremity supported Static Sitting - Level of Assistance: 5: Stand by assistance Extremity/Trunk Assessment RUE Assessment RUE Assessment: Exceptions to [facility] RUE Strength  RUE Overall Strength: Deficits LUE Assessment LUE Assessment: Exceptions to [facility] LUE Strength LUE Overall Strength: Deficits   See Function Navigator for Current Functional Status.  Leotis Shames [facility] 06/14/2016, 3:01 PM

## 2016-06-14 NOTE — Progress Notes (Signed)
Occupational Therapy Session Note  Patient Details  Name: Edward HolmesJoseph G Otte MRN: [id number] Date of Birth: 02/01/1981  Today's Date: 06/14/2016 OT Individual Time: 0800-0900 OT Individual Time Calculation (min): 60 min     Short Term Goals: Week 5:  OT Short Term Goal 1 (Week 5): STG=LTG due to LOS  Skilled Therapeutic Interventions/Progress Updates:    Pt resting in bed upon arrival.  Pt engaged in BADL retraining including LB bathing/dressing at bed level and UB bathing/dressing at w/c level.  Pt engaged in functional transfers to toilet and tub transfer bench.  Pt completed grooming tasks in w/c at sink.  Continued ongoing discharge planning and safety at home. Pt stated he was anxious to go home but was pleased with progress.  Therapy Documentation Precautions:  Precautions Precautions: Fall, Cervical Precaution Comments: collar for comfort ( Dr Ditty ordering soft collar 05/08/16) Required Braces or Orthoses: Cervical Brace Cervical Brace: For comfort Restrictions Weight Bearing Restrictions: No General:   Vital Signs:  Pain:   ADL: ADL ADL Comments: See Functional Assessment Tool Exercises:   Other Treatments:    See Function Navigator for Current Functional Status.   Therapy/Group: Individual Therapy  [name], Sahib Pella Chappell 06/14/2016, 11:03 AM
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
## 2016-06-14 NOTE — Progress Notes (Signed)
Physical Therapy Discharge Summary  Patient Details  Name: Edward [name] MRN: [id number] Date of Birth: [birth year]  Today's Date: 06/14/2016   Patient has met 7 of 7 long term goals due to improved activity tolerance, improved balance, improved postural control, increased strength, increased range of motion, ability to compensate for deficits and functional use of  right upper extremity, right lower extremity, left upper extremity and left lower extremity.  Patient to discharge at a wheelchair level [place].   Patient's care partner is independent to provide the necessary physical assistance at discharge.  Reasons goals not met: All goals met  Recommendation:  Patient will benefit from ongoing skilled PT services in home health setting to continue to advance safe functional mobility, address ongoing impairments in coordination, strength, balance, activity tolerance, and minimize fall risk.  Equipment: Power w/c, BLE AFOs  Reasons for discharge: treatment goals met and discharge from hospital  Patient/family agrees with progress made and goals achieved: Yes  PT Discharge Precautions/RestrictionsPrecautions Precautions: Fall;Cervical Precaution Comments: soft collar for comfort Required Braces or Orthoses: Cervical Brace Cervical Brace: For comfort Restrictions Weight Bearing Restrictions: No Pain Pain Assessment Pain Assessment: No/denies pain Vision/Perception  Perception Comments: WFL  Cognition Overall Cognitive Status: Within Functional Limits for tasks assessed Arousal/Alertness: Awake/alert Orientation Level: Oriented X4 Attention: Alternating;Sustained Sustained Attention: Appears intact Alternating Attention: Appears intact Memory: Appears intact Awareness: Appears intact Problem Solving: Appears intact Safety/Judgment: Appears intact Sensation Sensation Light Touch: Impaired Detail Light Touch Impaired Details: Impaired RUE;Absent RUE Stereognosis:  Impaired Detail Stereognosis Impaired Details: Absent RUE;Absent LUE Hot/Cold: Impaired Detail Hot/Cold Impaired Details: Impaired RUE Proprioception: Impaired Detail Proprioception Impaired Details: Impaired RLE;Impaired LLE Coordination Gross Motor Movements are Fluid and Coordinated: No Fine Motor Movements are Fluid and Coordinated: No Heel Shin Test: NT due to extensor tone Motor  Motor Motor: Abnormal tone;Abnormal postural alignment and control;Tetraplegia Motor - Discharge Observations: Tetraparesis, extensor tone/spasticity, sustained clonus BLE soleus  Mobility Bed Mobility Bed Mobility: Rolling Right;Rolling Left;Sit to Supine;Supine to Sit Rolling Right: 4: Min assist Rolling Right Details: Manual facilitation for weight shifting;Manual facilitation for placement;Manual facilitation for weight bearing;Verbal cues for technique Rolling Left: 4: Min assist Rolling Left Details: Manual facilitation for weight shifting;Manual facilitation for placement;Manual facilitation for weight bearing;Verbal cues for technique Supine to Sit: 3: Mod assist Supine to Sit Details: Manual facilitation for weight shifting;Manual facilitation for placement;Verbal cues for technique;Verbal cues for precautions/safety Sit to Supine: 3: Mod assist Sit to Supine - Details: Verbal cues for precautions/safety;Verbal cues for technique;Manual facilitation for weight shifting;Manual facilitation for placement Transfers Transfers: Yes Stand to Sit: 3: Mod assist;With upper extremity assist Stand to Sit Details (indicate cue type and reason): Verbal cues for technique;Manual facilitation for placement;Manual facilitation for weight shifting Stand Pivot Transfers: 4: Min assist;3: Mod assist Stand Pivot Transfer Details: Manual facilitation for placement;Verbal cues for precautions/safety;Verbal cues for technique;Verbal cues for gait pattern Lateral/Scoot Transfers: With slide board;3: Mod  assist Lateral/Scoot Transfer Details: Verbal cues for technique;Verbal cues for safe use of DME/AE;Verbal cues for sequencing;Verbal cues for precautions/safety;Manual facilitation for placement;Manual facilitation for weight shifting Locomotion  Ambulation Ambulation: Yes Ambulation/Gait Assistance: 5: Supervision;4: Min guard Ambulation Distance (Feet): 50 Feet Assistive device: Rolling walker;Other (Comment) (BLE AFOs, BUE hand splints) Ambulation/Gait Assistance Details: Verbal cues for precautions/safety;Verbal cues for technique;Verbal cues for gait pattern Ambulation/Gait Assistance Details: cues for foot placement to reduce scissoring Gait Gait: Yes Gait Pattern: Impaired Gait Pattern: Trunk flexed;Narrow base of support;Poor foot clearance - left;Poor foot  clearance - right;Scissoring;Decreased trunk rotation;Step-through pattern;Decreased stride length Gait velocity: significantly decreased for age/gender norms; non-functional except in controlled environment Stairs / Additional Locomotion Stairs: No Wheelchair Mobility Wheelchair Mobility: Yes Wheelchair Assistance: 6: Modified independent (Device/Increase time) Environmental health practitioner: Power Wheelchair Parts Management: Needs assistance Distance: 300'  Trunk/Postural Assessment  Cervical Assessment Cervical Assessment: Exceptions to [facility] (significantly limited cx rotation, extension/flexion, side bending) Thoracic Assessment Thoracic Assessment: Exceptions to [facility] (increased kyphosis, rounded shoulders) Lumbar Assessment Lumbar Assessment: Exceptions to [facility] (posterior pelvic tilt, reversal of lumbar lordosis) Postural Control Postural Control: Deficits on evaluation (delayed/inefficient righting and stepping reactions in sitting/standing)  Balance Static Sitting Balance Static Sitting - Balance Support: Bilateral upper extremity supported Static Sitting - Level of Assistance: 5: Stand by assistance Extremity Assessment   RUE Assessment RUE Assessment: Exceptions to [facility] RUE Strength RUE Overall Strength: Deficits LUE Assessment LUE Assessment: Exceptions to [facility] LUE Strength LUE Overall Strength: Deficits RLE Assessment RLE Assessment: Exceptions to [facility] RLE AROM (degrees) RLE Overall AROM Comments: Limited AROM due to tone/spasticity RLE PROM (degrees) RLE Overall PROM Comments: 90 degrees hip flexion, ~110 knee flexion, full knee extension, neutral hip extension  RLE Strength RLE Overall Strength Comments: 3/5 hip and knee extension. 2/5 ankle DF, trace knee flexion, 3/5 hip extension in functional activities LLE Assessment LLE Assessment: Exceptions to WFL LLE AROM (degrees) LLE Overall AROM Comments: Limited AROM due to tone/spasticity LLE PROM (degrees) LLE Overall PROM Comments: 90 degrees hip flexion, ~115 knee flexion, full knee extension, neutral hip extension  LLE Strength LLE Overall Strength Comments: 3/5 hip and knee extension. 2/5 ankle DF, trace knee flexion, 3/5 hip extension in functional activities   See Function Navigator for Current Functional Status.  Benjiman Core Tygielski 06/14/2016, 3:07 PM
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
## 2016-06-14 NOTE — Discharge Summary (Signed)
Discharge summary job # [id number]

## 2016-06-14 NOTE — Progress Notes (Signed)
Physical Therapy Session Note  Patient Details  Name: Edward [name] MRN: [id number] Date of Birth: 10/06/1980  Today's Date: 06/14/2016 PT Individual Time: 1030-1200 and 1300-1400 PT Individual Time Calculation (min): 90 min  And 60 min (total 150 min)   Short Term Goals: Week 5:  PT Short Term Goal 1 (Week 5): =LTG due to estimated LOS  Skilled Therapeutic Interventions/Progress Updates:   Tx 1: Pt received seated in w/c with SO Patty present; denies pain and agreeable to treatment. W/c management modI throughout session. Patty provided all assistance throughout session including donning AFOs, providing assist for sit <>stand and guarding during stand pivot transfers and gait. Patty demonstrated LE/UE stretching techniques, and therapist provided additional recommendations/suggestions for hand positioning, types of stretches. Performed stand pivot w/c>bed and slide board transfer bed>w/c both with modA. Gait with RW x30' with Patty performing assist for sit >stand and guarding while pt ambulating. Discussed home setup to allow pt to take rest breaks as needed, and importance of continuing to progress walking. Remained seated in w/c at end of session, all needs in reach.   Tx 2: Pt received seated in w/c, denies pain and agreeable to treatment. W/c management to transport to main entrance of hospital including on/off elevators, indoors/outdoors all with modI. Car transfer performed stand pivot with RW, BLE AFOs, B hand splints. Once pt pivoted to car, removed RW to allow Patty to stand in front of pt and assist with eccentric control and avoiding hitting head on door frame. Pt with increased clonus in standing due to cold temperature and also likely due to anxiety. Returned to room in w/c modI. Remained seated in w/c at end of session, all needs in reach.   Therapy Documentation Precautions:  Precautions Precautions: Fall, Cervical Precaution Comments: soft collar for comfort Required Braces  or Orthoses: Cervical Brace Cervical Brace: For comfort Restrictions Weight Bearing Restrictions: No Pain: Pain Assessment Pain Assessment: No/denies pain Pain Score: 1    See Function Navigator for Current Functional Status.   Therapy/Group: Individual Therapy  Vista Lawmanlizabeth J Tygielski 06/14/2016, 1:01 PM

## 2016-06-15 NOTE — Progress Notes (Signed)
Girlfriend adminstered another successful suppository to pt. No discomfort or complications from pt.
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
## 2016-06-15 NOTE — Progress Notes (Signed)
06/15/16 0958 nursing Patient is discharged to home per wheelchair accompanied by NT and significant other. Per patient discharge orders were done yesterday with no further questions. Per patient he refused flu shot on admission.

## 2016-06-17 NOTE — Progress Notes (Signed)
Social Work  Discharge Note  The overall goal for the admission was met for:   Discharge location: Yes - home with girlfriend and family providing 24/7 assistance  Length of Stay: Yes - 35 days  Discharge activity level: Yes - mod to max assist power w/c  Home/community participation: Yes  Services provided included: MD, RD, PT, OT, RN, TR, Pharmacy, [place]: Medicaid  Follow-up services arranged: Home Health: RN, PT, OT via [place], DME: power wheelchair, padded tub bench with cut out seat, 30" transfer board via [facility], Other: briefs and chux pads ordered via [employer] and Patient/Family has no preference for HH/DME agencies  Comments (or additional information):  Patient/Family verbalized understanding of follow-up arrangements: Yes  Individual responsible for coordination of the follow-up plan: pt  Confirmed correct DME delivered: Rabiah Goeser 06/17/2016    Zephyra Bernardi

## 2016-06-18 ENCOUNTER — Telehealth: Payer: Self-pay | Admitting: *Deleted

## 2016-06-18 NOTE — Telephone Encounter (Signed)
Transitional care call completed, appointment changed to 3:20 pm, address confirmed, new patient packet sent  Transitional Care Questions  Questions for our staff to ask patients on Transitional care 48 hour phone call:   1. Are you/is patient experiencing any problems since coming home? Yes, increased leg spasms, increased anxiety, patient has frequent episodes of crying Are there any questions regarding any aspect of care? No  2. Are there any questions regarding medications administration/dosing?  No  Are meds being taken as prescribed? Yes  Patient should review meds with caller to confirm   3. Have there been any falls? No  4. Has Home Health been to the house and/or have they contacted you? Yes, nursing and physical therapy assessment completed, awaiting OT assessment If not, have you tried to contact them? Can we help you contact them?   5. Are bowels and bladder emptying properly?  Yes Are there any unexpected incontinence issues? no If applicable, is patient following bowel/bladder programs?  6. Any fevers, problems with breathing, unexpected pain? One episode of night sweats, otherwise, No   7. Are there any skin problems or new areas of breakdown? no  8. Has the patient/family member arranged specialty MD follow up (ie cardiology/neurology/renal/surgical/etc)? Yes  Can we help arrange? No   9. Does the patient need any other services or support that we can help arrange? Not immediately, may have a need for a home health aide in the near future  10. Are caregivers following through as expected in assisting the patient? Yes

## 2016-06-24 ENCOUNTER — Other Ambulatory Visit: Payer: Self-pay | Admitting: *Deleted

## 2016-06-24 ENCOUNTER — Other Ambulatory Visit: Payer: Self-pay

## 2016-06-24 MED ORDER — OXYCODONE HCL 5 MG PO TABS: 5.0000 mg | ORAL_TABLET | Freq: Four times a day (QID) | ORAL | 0 refills | Status: DC | PRN

## 2016-06-24 MED ORDER — OXYCODONE HCL ER 20 MG PO T12A: 20.0000 mg | EXTENDED_RELEASE_TABLET | Freq: Two times a day (BID) | ORAL | 0 refills | Status: DC

## 2016-06-24 NOTE — Telephone Encounter (Signed)
Patients mother called requesting medication refill for patient, states he only has 1 pill left of oxycotin.  Patient has appointment on  06-26-16, please advise

## 2016-06-24 NOTE — Telephone Encounter (Signed)
Refilled oxycontin 20mg  #60  Needs CSA at 12/6 visit

## 2016-06-24 NOTE — Telephone Encounter (Signed)
rx printed and signed, patient notified that rx is avialiable for pick up. Either himself or girlfriend will pick up meds Edward [name]. rx is placed in binder up front

## 2016-06-26 ENCOUNTER — Encounter: Payer: Self-pay | Admitting: Physical Medicine & Rehabilitation

## 2016-06-26 ENCOUNTER — Encounter: Payer: Medicaid Other | Attending: Physical Medicine & Rehabilitation | Admitting: Physical Medicine & Rehabilitation

## 2016-06-26 DIAGNOSIS — Z7901 Long term (current) use of anticoagulants: Secondary | ICD-10-CM | POA: Diagnosis not present

## 2016-06-26 DIAGNOSIS — K592 Neurogenic bowel, not elsewhere classified: Secondary | ICD-10-CM | POA: Insufficient documentation

## 2016-06-26 DIAGNOSIS — G8250 Quadriplegia, unspecified: Secondary | ICD-10-CM

## 2016-06-26 DIAGNOSIS — Z981 Arthrodesis status: Secondary | ICD-10-CM | POA: Insufficient documentation

## 2016-06-26 DIAGNOSIS — R252 Cramp and spasm: Secondary | ICD-10-CM | POA: Diagnosis not present

## 2016-06-26 DIAGNOSIS — S14104S Unspecified injury at C4 level of cervical spinal cord, sequela: Secondary | ICD-10-CM | POA: Insufficient documentation

## 2016-06-26 DIAGNOSIS — G904 Autonomic dysreflexia: Secondary | ICD-10-CM | POA: Diagnosis not present

## 2016-06-26 DIAGNOSIS — N319 Neuromuscular dysfunction of bladder, unspecified: Secondary | ICD-10-CM | POA: Insufficient documentation

## 2016-06-26 DIAGNOSIS — F419 Anxiety disorder, unspecified: Secondary | ICD-10-CM | POA: Insufficient documentation

## 2016-06-26 DIAGNOSIS — M792 Neuralgia and neuritis, unspecified: Secondary | ICD-10-CM | POA: Diagnosis not present

## 2016-06-26 DIAGNOSIS — F17200 Nicotine dependence, unspecified, uncomplicated: Secondary | ICD-10-CM | POA: Insufficient documentation

## 2016-06-26 DIAGNOSIS — S14101S Unspecified injury at C1 level of cervical spinal cord, sequela: Secondary | ICD-10-CM | POA: Diagnosis not present

## 2016-06-26 DIAGNOSIS — F329 Major depressive disorder, single episode, unspecified: Secondary | ICD-10-CM | POA: Diagnosis not present

## 2016-06-26 MED ORDER — CLONAZEPAM 0.5 MG PO TABS: 0.5000 mg | ORAL_TABLET | Freq: Two times a day (BID) | ORAL | 2 refills | Status: DC

## 2016-06-26 NOTE — Patient Instructions (Signed)
PLEASE CALL ME WITH ANY PROBLEMS OR QUESTIONS ([phone])   HAPPY HOLIDAYS!!!!                    *                * *             *   *   *         *  *   *  *  *     *  *  *  *  *  *  * *  *  *  *  *  *  *  *  *  * *               *  *               *  *               *  *  
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
## 2016-06-27 ENCOUNTER — Telehealth: Payer: Self-pay | Admitting: *Deleted

## 2016-06-27 NOTE — Telephone Encounter (Signed)
Prior Authorization submitted for oxycontin 20 mg tablets to [facility]

## 2016-06-28 ENCOUNTER — Telehealth: Payer: Self-pay | Admitting: Physical Medicine & Rehabilitation

## 2016-06-28 NOTE — Telephone Encounter (Signed)
[place] Edward [name], PT, [facility], let her know that the orders are in Dr. Rosalyn ChartersSwartz's inbox awaiting signatures.She acknowledged
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
## 2016-07-01 ENCOUNTER — Telehealth: Payer: Self-pay | Admitting: *Deleted

## 2016-07-01 NOTE — Telephone Encounter (Signed)
Resubmitted PA for Oxycontin 20 mg #60 for high dose opioid with [place] [employer] [facility]

## 2016-07-05 ENCOUNTER — Telehealth: Payer: Self-pay

## 2016-07-05 NOTE — Telephone Encounter (Signed)
Patient states "pain has increased" and "medications aren't helping much"  Patient would like to know what he can do.  Please advise

## 2016-07-05 NOTE — Telephone Encounter (Signed)
Spasticity? Neuropathic pain? Back/joint pain? If it's his back he may need an xray to assess---perhaps Riley Lamunice could order?.  He is on a fair amount of medication already and we had already discussed how to DECREASE his meds at last visit. Would recommend liberal heat/ice to low back if that's what's bothering him.

## 2016-07-08 NOTE — Telephone Encounter (Signed)
Called pt back to clarify, he was calling to report on his klonopin and the relief it was giving for his shaking legs.  He is reporting approximately 50% relief on those symptoms.  He said was asked to call back and report to Dr. [name] in this regard and is wondering if a higher dose would net a better result.

## 2016-07-10 ENCOUNTER — Other Ambulatory Visit: Payer: Self-pay | Admitting: *Deleted

## 2016-07-10 MED ORDER — PANTOPRAZOLE SODIUM 20 MG PO TBEC: 20.0000 mg | DELAYED_RELEASE_TABLET | Freq: Every day | ORAL | 0 refills | Status: DC

## 2016-07-10 MED ORDER — RIVAROXABAN 10 MG PO TABS: 10.0000 mg | ORAL_TABLET | Freq: Every day | ORAL | 0 refills | Status: DC

## 2016-07-10 MED ORDER — BACLOFEN 20 MG PO TABS: 20.0000 mg | ORAL_TABLET | Freq: Four times a day (QID) | ORAL | 0 refills | Status: DC

## 2016-07-12 ENCOUNTER — Other Ambulatory Visit: Payer: Self-pay | Admitting: *Deleted

## 2016-07-12 MED ORDER — TIZANIDINE HCL 4 MG PO TABS: 4.0000 mg | ORAL_TABLET | Freq: Three times a day (TID) | ORAL | 0 refills | Status: DC

## 2016-07-12 MED ORDER — BACLOFEN 20 MG PO TABS: 20.0000 mg | ORAL_TABLET | Freq: Four times a day (QID) | ORAL | 0 refills | Status: DC

## 2016-07-12 MED ORDER — GABAPENTIN 300 MG PO CAPS: 600.0000 mg | ORAL_CAPSULE | Freq: Three times a day (TID) | ORAL | 0 refills | Status: DC

## 2016-07-12 MED ORDER — METHOCARBAMOL 500 MG PO TABS
500.0000 mg | ORAL_TABLET | Freq: Four times a day (QID) | ORAL | 0 refills | Status: DC | PRN
Start: 2016-07-12 — End: 2016-09-25

## 2016-07-12 MED ORDER — RIVAROXABAN 10 MG PO TABS: 10.0000 mg | ORAL_TABLET | Freq: Every day | ORAL | 0 refills | Status: DC

## 2016-07-12 MED ORDER — PANTOPRAZOLE SODIUM 20 MG PO TBEC: 20.0000 mg | DELAYED_RELEASE_TABLET | Freq: Every day | ORAL | 0 refills | Status: DC

## 2016-07-12 NOTE — Telephone Encounter (Signed)
Medications xarelto baclofen, and protonix resent to pharmacy--[facility]

## 2016-07-17 ENCOUNTER — Telehealth: Payer: Self-pay

## 2016-07-17 NOTE — Telephone Encounter (Signed)
Return Edward [name] call, no answer. Left message to return the call.

## 2016-07-17 NOTE — Telephone Encounter (Signed)
Patient returned your phone call at 425pm, states does no get good reception at his residence, asked if you can call him back tomorrow at [phone]

## 2016-07-18 NOTE — Telephone Encounter (Signed)
Return Edward [name] call, no answer. Left message return the call.

## 2016-07-19 ENCOUNTER — Encounter: Payer: Medicaid Other | Admitting: Registered Nurse

## 2016-07-29 ENCOUNTER — Encounter: Payer: Medicaid Other | Attending: Physical Medicine & Rehabilitation | Admitting: Registered Nurse

## 2016-07-29 ENCOUNTER — Encounter: Payer: Self-pay | Admitting: Registered Nurse

## 2016-07-29 VITALS — BP 123/81 | HR 88 | Resp 14

## 2016-07-29 DIAGNOSIS — F329 Major depressive disorder, single episode, unspecified: Secondary | ICD-10-CM | POA: Insufficient documentation

## 2016-07-29 DIAGNOSIS — Z7901 Long term (current) use of anticoagulants: Secondary | ICD-10-CM | POA: Insufficient documentation

## 2016-07-29 DIAGNOSIS — G894 Chronic pain syndrome: Secondary | ICD-10-CM

## 2016-07-29 DIAGNOSIS — Z981 Arthrodesis status: Secondary | ICD-10-CM | POA: Insufficient documentation

## 2016-07-29 DIAGNOSIS — S14104S Unspecified injury at C4 level of cervical spinal cord, sequela: Secondary | ICD-10-CM | POA: Diagnosis not present

## 2016-07-29 DIAGNOSIS — M792 Neuralgia and neuritis, unspecified: Secondary | ICD-10-CM

## 2016-07-29 DIAGNOSIS — K592 Neurogenic bowel, not elsewhere classified: Secondary | ICD-10-CM | POA: Diagnosis not present

## 2016-07-29 DIAGNOSIS — G8250 Quadriplegia, unspecified: Secondary | ICD-10-CM | POA: Diagnosis not present

## 2016-07-29 DIAGNOSIS — R252 Cramp and spasm: Secondary | ICD-10-CM | POA: Diagnosis not present

## 2016-07-29 DIAGNOSIS — F419 Anxiety disorder, unspecified: Secondary | ICD-10-CM | POA: Diagnosis not present

## 2016-07-29 DIAGNOSIS — N319 Neuromuscular dysfunction of bladder, unspecified: Secondary | ICD-10-CM

## 2016-07-29 DIAGNOSIS — Z5181 Encounter for therapeutic drug level monitoring: Secondary | ICD-10-CM

## 2016-07-29 DIAGNOSIS — M62838 Other muscle spasm: Secondary | ICD-10-CM

## 2016-07-29 DIAGNOSIS — F17200 Nicotine dependence, unspecified, uncomplicated: Secondary | ICD-10-CM | POA: Diagnosis not present

## 2016-07-29 DIAGNOSIS — Z79899 Other long term (current) drug therapy: Secondary | ICD-10-CM

## 2016-07-29 DIAGNOSIS — S14101S Unspecified injury at C1 level of cervical spinal cord, sequela: Secondary | ICD-10-CM

## 2016-07-29 MED ORDER — PANTOPRAZOLE SODIUM 20 MG PO TBEC: 20.0000 mg | DELAYED_RELEASE_TABLET | Freq: Every day | ORAL | 1 refills | Status: DC

## 2016-07-29 MED ORDER — BACLOFEN 20 MG PO TABS: 20.0000 mg | ORAL_TABLET | Freq: Four times a day (QID) | ORAL | 2 refills | Status: DC

## 2016-07-29 MED ORDER — CLONAZEPAM 0.5 MG PO TABS: 0.5000 mg | ORAL_TABLET | Freq: Three times a day (TID) | ORAL | 2 refills | Status: DC

## 2016-07-29 MED ORDER — OXYCODONE HCL 5 MG PO TABS: 5.0000 mg | ORAL_TABLET | Freq: Four times a day (QID) | ORAL | 0 refills | Status: DC | PRN

## 2016-07-29 MED ORDER — GABAPENTIN 300 MG PO CAPS: 600.0000 mg | ORAL_CAPSULE | Freq: Three times a day (TID) | ORAL | 2 refills | Status: DC

## 2016-07-29 MED ORDER — TIZANIDINE HCL 4 MG PO TABS: 4.0000 mg | ORAL_TABLET | Freq: Three times a day (TID) | ORAL | 2 refills | Status: DC

## 2016-07-29 MED ORDER — OXYCODONE HCL ER 20 MG PO T12A: 20.0000 mg | EXTENDED_RELEASE_TABLET | Freq: Two times a day (BID) | ORAL | 0 refills | Status: DC
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
## 2016-07-29 NOTE — Patient Instructions (Addendum)
Increase the Klonopin to three times a day  On 08/05/2016 if you're still experiencing right foot burning pain Increase Gabapentin to 4 times a day One at Breakfast, Lunch, Supper and Bedtime   Call Office on 08/10/2016 to evaluate medication changes  [phone]

## 2016-08-02 LAB — TOXASSURE SELECT,+ANTIDEPR,UR

## 2016-08-05 NOTE — Progress Notes (Signed)
Urine drug screen for this encounter is consistent for prescribed medication 

## 2016-08-08 ENCOUNTER — Telehealth: Payer: Self-pay | Admitting: Registered Nurse

## 2016-08-08 NOTE — Telephone Encounter (Signed)
On January 18,2018 NCCSR was reviewed: No conflict was seen on the [place] Controlled Substance Reporting System with Multiple Prescribers. Edward [name] has a signed  Narcotic Contract with our office. If there were any discrepancies this would have been  reported to his  Physcian.

## 2016-08-12 ENCOUNTER — Other Ambulatory Visit: Payer: Self-pay | Admitting: Physical Medicine & Rehabilitation

## 2016-08-13 ENCOUNTER — Telehealth: Payer: Self-pay | Admitting: *Deleted

## 2016-08-13 DIAGNOSIS — S14101A Unspecified injury at C1 level of cervical spinal cord, initial encounter: Secondary | ICD-10-CM | POA: Diagnosis not present

## 2016-08-13 NOTE — Telephone Encounter (Signed)
Patient left a message for Riley Lamunice to inform that medication change is helping. I reviewed patient's clinic note. I presume this is in regards to klonopin increase and to increase number of doses of gabapentin if necessary.  The clinic note asked patient to call us back to assess medication changes.  FYI. Call patient back if you deem necessary

## 2016-08-13 NOTE — Telephone Encounter (Signed)
Patient requesting a refill of xarelto, on last note it states:  . DVT Prophylaxis/Anticoagulation: -changed to daily xarelto -12 weeks of dvt prophylaxis from 05/06/16 Vascular study Neg 10/22  Next appointment with us is 08/28/2016, should we refill?

## 2016-08-15 ENCOUNTER — Other Ambulatory Visit: Payer: Self-pay

## 2016-08-15 MED ORDER — RIVAROXABAN 10 MG PO TABS: 10.0000 mg | ORAL_TABLET | Freq: Every day | ORAL | 1 refills | Status: DC

## 2016-08-19 ENCOUNTER — Other Ambulatory Visit: Payer: Self-pay

## 2016-08-28 ENCOUNTER — Encounter: Payer: Medicaid Other | Attending: Physical Medicine & Rehabilitation | Admitting: Registered Nurse

## 2016-08-28 ENCOUNTER — Encounter: Payer: Self-pay | Admitting: Registered Nurse

## 2016-08-28 VITALS — BP 113/81 | HR 89 | Resp 14

## 2016-08-28 DIAGNOSIS — F419 Anxiety disorder, unspecified: Secondary | ICD-10-CM | POA: Insufficient documentation

## 2016-08-28 DIAGNOSIS — F17200 Nicotine dependence, unspecified, uncomplicated: Secondary | ICD-10-CM | POA: Insufficient documentation

## 2016-08-28 DIAGNOSIS — M792 Neuralgia and neuritis, unspecified: Secondary | ICD-10-CM | POA: Diagnosis not present

## 2016-08-28 DIAGNOSIS — N319 Neuromuscular dysfunction of bladder, unspecified: Secondary | ICD-10-CM | POA: Insufficient documentation

## 2016-08-28 DIAGNOSIS — G894 Chronic pain syndrome: Secondary | ICD-10-CM

## 2016-08-28 DIAGNOSIS — R252 Cramp and spasm: Secondary | ICD-10-CM | POA: Diagnosis not present

## 2016-08-28 DIAGNOSIS — Z7901 Long term (current) use of anticoagulants: Secondary | ICD-10-CM | POA: Insufficient documentation

## 2016-08-28 DIAGNOSIS — Z981 Arthrodesis status: Secondary | ICD-10-CM | POA: Diagnosis not present

## 2016-08-28 DIAGNOSIS — S14104S Unspecified injury at C4 level of cervical spinal cord, sequela: Secondary | ICD-10-CM | POA: Insufficient documentation

## 2016-08-28 DIAGNOSIS — K592 Neurogenic bowel, not elsewhere classified: Secondary | ICD-10-CM | POA: Insufficient documentation

## 2016-08-28 DIAGNOSIS — S14101S Unspecified injury at C1 level of cervical spinal cord, sequela: Secondary | ICD-10-CM | POA: Diagnosis not present

## 2016-08-28 DIAGNOSIS — Z5181 Encounter for therapeutic drug level monitoring: Secondary | ICD-10-CM

## 2016-08-28 DIAGNOSIS — M62838 Other muscle spasm: Secondary | ICD-10-CM

## 2016-08-28 DIAGNOSIS — F329 Major depressive disorder, single episode, unspecified: Secondary | ICD-10-CM | POA: Diagnosis not present

## 2016-08-28 DIAGNOSIS — Z79899 Other long term (current) drug therapy: Secondary | ICD-10-CM

## 2016-08-28 MED ORDER — GABAPENTIN 300 MG PO CAPS: 600.0000 mg | ORAL_CAPSULE | Freq: Four times a day (QID) | ORAL | 2 refills | Status: DC

## 2016-08-28 MED ORDER — OXYCODONE HCL ER 20 MG PO T12A: 20.0000 mg | EXTENDED_RELEASE_TABLET | Freq: Two times a day (BID) | ORAL | 0 refills | Status: DC

## 2016-08-28 MED ORDER — OXYCODONE HCL 5 MG PO TABS: 5.0000 mg | ORAL_TABLET | Freq: Four times a day (QID) | ORAL | 0 refills | Status: DC | PRN

## 2016-08-28 MED ORDER — SENNOSIDES-DOCUSATE SODIUM 8.6-50 MG PO TABS: 2.0000 | ORAL_TABLET | ORAL | 1 refills | Status: DC
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
## 2016-08-30 ENCOUNTER — Telehealth: Payer: Self-pay | Admitting: *Deleted

## 2016-08-30 NOTE — Telephone Encounter (Signed)
Crystal from disability claims needs to speak with Dimas MillinEunice(or Swartz) about some inconsistencies in documentation. Please call [phone] ext [phone].

## 2016-08-30 NOTE — Telephone Encounter (Signed)
Return Ms. Crystal call, all questions answered.

## 2016-09-06 ENCOUNTER — Ambulatory Visit: Payer: Medicaid Other | Attending: Registered Nurse

## 2016-09-06 DIAGNOSIS — G8252 Quadriplegia, C1-C4 incomplete: Secondary | ICD-10-CM | POA: Diagnosis not present

## 2016-09-06 DIAGNOSIS — R2689 Other abnormalities of gait and mobility: Secondary | ICD-10-CM | POA: Diagnosis present
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
## 2016-09-10 NOTE — Addendum Note (Signed)
Addended by: Sherren KernsMILLER, Lamone Ferrelli L on: 09/10/2016 08:05 AM   Modules accepted: Orders

## 2016-09-20 ENCOUNTER — Ambulatory Visit: Payer: Medicaid Other | Attending: Registered Nurse | Admitting: Physical Therapy

## 2016-09-20 ENCOUNTER — Encounter: Payer: Self-pay | Admitting: Physical Therapy

## 2016-09-20 DIAGNOSIS — G8252 Quadriplegia, C1-C4 incomplete: Secondary | ICD-10-CM | POA: Insufficient documentation

## 2016-09-20 DIAGNOSIS — R2689 Other abnormalities of gait and mobility: Secondary | ICD-10-CM | POA: Insufficient documentation

## 2016-09-20 DIAGNOSIS — X58XXXD Exposure to other specified factors, subsequent encounter: Secondary | ICD-10-CM | POA: Diagnosis not present

## 2016-09-20 DIAGNOSIS — S14101D Unspecified injury at C1 level of cervical spinal cord, subsequent encounter: Secondary | ICD-10-CM | POA: Insufficient documentation
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
## 2016-09-20 NOTE — Patient Instructions (Signed)
(  Home) Squat: (Assist)    Using supports, arms close to body, squat by dropping hips back as if sitting in a chair. Repeat _10-12___ times per set. Do __1__ sets per session. Do _2-3__ sessions per day.  Side-Stepping    Walk to left side with eyes open. Take even steps, leading with same foot. Make sure each foot lifts off the floor-8-10 steps. Repeat in opposite direction. Do _2-3___ sessions per day.   Backward    Walk backwards with eyes open. Take even steps, making sure each foot lifts off floor around [place].  Do _1-2__ sessions per day.  ANKLE: Dorsiflexion Chartered loss adjuster(Slant Board)    Stand on slant board. Press weight onto heels. Hold _20-30__ seconds. __2_ reps per set, _2-3 sets per day. Hold onto a support. Support arch of foot with wedge or towel.  Copyright  VHI. All rights reserved.

## 2016-09-24 ENCOUNTER — Ambulatory Visit: Payer: Medicaid Other | Admitting: Physical Therapy

## 2016-09-24 DIAGNOSIS — G8252 Quadriplegia, C1-C4 incomplete: Secondary | ICD-10-CM | POA: Diagnosis not present

## 2016-09-24 DIAGNOSIS — R2689 Other abnormalities of gait and mobility: Secondary | ICD-10-CM

## 2016-09-25 ENCOUNTER — Encounter: Payer: Self-pay | Admitting: Registered Nurse

## 2016-09-25 ENCOUNTER — Encounter: Payer: Medicaid Other | Attending: Physical Medicine & Rehabilitation | Admitting: Registered Nurse

## 2016-09-25 VITALS — BP 114/80 | HR 93 | Resp 14

## 2016-09-25 DIAGNOSIS — M792 Neuralgia and neuritis, unspecified: Secondary | ICD-10-CM | POA: Diagnosis not present

## 2016-09-25 DIAGNOSIS — G894 Chronic pain syndrome: Secondary | ICD-10-CM

## 2016-09-25 DIAGNOSIS — M62838 Other muscle spasm: Secondary | ICD-10-CM

## 2016-09-25 DIAGNOSIS — Z7901 Long term (current) use of anticoagulants: Secondary | ICD-10-CM | POA: Diagnosis not present

## 2016-09-25 DIAGNOSIS — Z5181 Encounter for therapeutic drug level monitoring: Secondary | ICD-10-CM | POA: Diagnosis not present

## 2016-09-25 DIAGNOSIS — F419 Anxiety disorder, unspecified: Secondary | ICD-10-CM | POA: Insufficient documentation

## 2016-09-25 DIAGNOSIS — F329 Major depressive disorder, single episode, unspecified: Secondary | ICD-10-CM | POA: Diagnosis not present

## 2016-09-25 DIAGNOSIS — Z981 Arthrodesis status: Secondary | ICD-10-CM | POA: Diagnosis not present

## 2016-09-25 DIAGNOSIS — S14101S Unspecified injury at C1 level of cervical spinal cord, sequela: Secondary | ICD-10-CM

## 2016-09-25 DIAGNOSIS — Z79899 Other long term (current) drug therapy: Secondary | ICD-10-CM | POA: Diagnosis not present

## 2016-09-25 DIAGNOSIS — S14104S Unspecified injury at C4 level of cervical spinal cord, sequela: Secondary | ICD-10-CM | POA: Insufficient documentation

## 2016-09-25 DIAGNOSIS — R252 Cramp and spasm: Secondary | ICD-10-CM | POA: Diagnosis not present

## 2016-09-25 DIAGNOSIS — N319 Neuromuscular dysfunction of bladder, unspecified: Secondary | ICD-10-CM

## 2016-09-25 DIAGNOSIS — K592 Neurogenic bowel, not elsewhere classified: Secondary | ICD-10-CM | POA: Insufficient documentation

## 2016-09-25 DIAGNOSIS — G8250 Quadriplegia, unspecified: Secondary | ICD-10-CM

## 2016-09-25 DIAGNOSIS — F17200 Nicotine dependence, unspecified, uncomplicated: Secondary | ICD-10-CM | POA: Insufficient documentation

## 2016-09-25 MED ORDER — BACLOFEN 20 MG PO TABS: 20.0000 mg | ORAL_TABLET | Freq: Four times a day (QID) | ORAL | 2 refills | Status: DC

## 2016-09-25 MED ORDER — OXYCODONE HCL ER 20 MG PO T12A: 20.0000 mg | EXTENDED_RELEASE_TABLET | Freq: Two times a day (BID) | ORAL | 0 refills | Status: DC

## 2016-09-25 MED ORDER — CLONAZEPAM 0.5 MG PO TABS: 0.5000 mg | ORAL_TABLET | Freq: Three times a day (TID) | ORAL | 2 refills | Status: DC

## 2016-09-25 MED ORDER — PANTOPRAZOLE SODIUM 20 MG PO TBEC: 20.0000 mg | DELAYED_RELEASE_TABLET | Freq: Every day | ORAL | 1 refills | Status: DC

## 2016-09-25 MED ORDER — TIZANIDINE HCL 4 MG PO TABS: 4.0000 mg | ORAL_TABLET | Freq: Three times a day (TID) | ORAL | 2 refills | Status: DC

## 2016-09-25 MED ORDER — OXYCODONE HCL 5 MG PO TABS: 5.0000 mg | ORAL_TABLET | Freq: Four times a day (QID) | ORAL | 0 refills | Status: DC | PRN

## 2016-09-25 MED ORDER — METHOCARBAMOL 500 MG PO TABS: 500.0000 mg | ORAL_TABLET | Freq: Four times a day (QID) | ORAL | 0 refills | Status: DC | PRN
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
## 2016-09-28 ENCOUNTER — Other Ambulatory Visit: Payer: Self-pay | Admitting: Registered Nurse

## 2016-09-30 ENCOUNTER — Ambulatory Visit: Payer: Medicaid Other | Admitting: Physical Therapy

## 2016-10-07 ENCOUNTER — Ambulatory Visit: Payer: Medicaid Other

## 2016-10-07 DIAGNOSIS — G8252 Quadriplegia, C1-C4 incomplete: Secondary | ICD-10-CM | POA: Diagnosis not present

## 2016-10-07 DIAGNOSIS — S14101D Unspecified injury at C1 level of cervical spinal cord, subsequent encounter: Secondary | ICD-10-CM

## 2016-10-07 DIAGNOSIS — R2689 Other abnormalities of gait and mobility: Secondary | ICD-10-CM

## 2016-10-07 NOTE — Patient Instructions (Addendum)
Functional Quadriceps: Sit to Stand    Sit on edge of chair, feet flat on floor. Stand upright, extending knees fully. Repeat _10___ times per set. Do _1___ sets per session. Do _2-3___ sessions per day.  http://orth.exer.us/734   Copyright  VHI. All rights reserved.   Hamstrings    Lie on stomach. Bend same knee __90__ degrees, pointing toes toward knee. Do not bend hips. Repeat with both legs.  Hold _2___ seconds. Repeat __10__ times. Perform 2 sets. Do __3-4__ sessions per week. CAUTION: Move slowly.  Copyright  VHI. All rights reserved.   Abduction: Clam (Eccentric) - Side-Lying    Lie on side with knees bent. Lift top knee, keeping feet together. Keep trunk steady. Slowly lower leg. Make sure hips stay forward. _10__ reps per set, __2_ sets per day, _3-4__ days per week. Repeat on both legs.   http://ecce.exer.us/64   Copyright  VHI. All rights reserved.   Strengthening: Wall Slide    Leaning on wall, slowly lower buttocks until thighs are parallel to floor. Hold __5__ seconds. Tighten thigh muscles and return. Repeat __5__ times per set. Do __2__ sets per session. Do __3-4__ sessions per week.  http://orth.exer.us/630   Copyright  VHI. All rights reserved.
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
## 2016-10-14 ENCOUNTER — Ambulatory Visit: Payer: Medicaid Other

## 2016-10-14 DIAGNOSIS — G8252 Quadriplegia, C1-C4 incomplete: Secondary | ICD-10-CM | POA: Diagnosis not present

## 2016-10-14 DIAGNOSIS — R2689 Other abnormalities of gait and mobility: Secondary | ICD-10-CM
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
## 2016-10-14 NOTE — Patient Instructions (Signed)
Side-Stepping    Hover hands at counter. Walk to left side with eyes open. Take even steps, leading with same foot. Make sure each foot lifts off the floor-8-10 steps. Repeat in opposite direction. Perform 4 times total. Do _1__ sessions per day.   Backward    Walk backwards with eyes open. Take even steps, making sure each foot lifts off floor around [place]. Try hovering hands above counter, with someone on the other side of you for safety. Perform 4 reps at counter.  Do _1__ sessions per day.   Braiding    Perform at counter OR holding onto someone's hands. Move to side: 1) cross right leg in front of left, 2) bring back leg out to side, then 3) cross right leg behind left, 4) bring left leg out to side. Continue sequence in same direction. Reverse sequence, moving in opposite direction. Repeat sequence _2___ times per session. Do __1__ sessions per day.  Copyright  VHI. All rights reserved.   HIP: Hamstrings - Short Sitting    Rest leg on raised surface. Keep knee straight. Lift chest. Hold _30__ seconds. Repeat with other leg. _2-3__ reps per set, _2-3__ sets per day, _7__ days per week  Copyright  VHI. All rights reserved.   Achilles / Gastroc, Standing    Hold walker. Stand, right foot behind, heel on floor and turned slightly out, leg straight, forward leg bent. Move hips forward. Hold _30__ seconds. Repeat with other leg. Repeat _2-3__ times per session. Do _2-3__ sessions per day.  Copyright  VHI. All rights reserved.

## 2016-10-21 ENCOUNTER — Telehealth (INDEPENDENT_AMBULATORY_CARE_PROVIDER_SITE_OTHER): Payer: Medicaid Other | Admitting: Physical Therapy

## 2016-10-21 ENCOUNTER — Encounter: Payer: Self-pay | Admitting: Physical Therapy

## 2016-10-21 ENCOUNTER — Ambulatory Visit: Payer: Medicaid Other | Attending: Registered Nurse | Admitting: Physical Therapy

## 2016-10-21 DIAGNOSIS — G8252 Quadriplegia, C1-C4 incomplete: Secondary | ICD-10-CM | POA: Insufficient documentation

## 2016-10-21 DIAGNOSIS — S14101S Unspecified injury at C1 level of cervical spinal cord, sequela: Secondary | ICD-10-CM

## 2016-10-21 DIAGNOSIS — R2689 Other abnormalities of gait and mobility: Secondary | ICD-10-CM | POA: Insufficient documentation
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
## 2016-10-21 NOTE — Telephone Encounter (Signed)
Hello Dr. Riley Kill, We were discussing assistive device options with Edward [name] and his fiance today as he progresses away from the RW.  We initiated gait training with loftstrand crutches today and he is agreeable to train with loftstrands for home and community ambulation. Would you mind submitting an order in [employer] for bilat loftstrands or fax to [facility] at [phone]?  Thank you, Edman Circle, PT, DPT 10/21/16    1:41 PM   [facility] [address] [place], [place]  [address] Phone:  [phone] Fax:  [phone]

## 2016-10-22 NOTE — Telephone Encounter (Signed)
Order place in [place]. Thanks!

## 2016-10-28 ENCOUNTER — Encounter: Payer: Self-pay | Admitting: Physical Medicine & Rehabilitation

## 2016-10-28 ENCOUNTER — Encounter: Payer: Medicaid Other | Attending: Physical Medicine & Rehabilitation | Admitting: Physical Medicine & Rehabilitation

## 2016-10-28 VITALS — BP 121/80 | HR 85 | Resp 14

## 2016-10-28 DIAGNOSIS — Z981 Arthrodesis status: Secondary | ICD-10-CM | POA: Diagnosis not present

## 2016-10-28 DIAGNOSIS — N319 Neuromuscular dysfunction of bladder, unspecified: Secondary | ICD-10-CM | POA: Diagnosis not present

## 2016-10-28 DIAGNOSIS — R252 Cramp and spasm: Secondary | ICD-10-CM | POA: Diagnosis not present

## 2016-10-28 DIAGNOSIS — F17200 Nicotine dependence, unspecified, uncomplicated: Secondary | ICD-10-CM | POA: Diagnosis not present

## 2016-10-28 DIAGNOSIS — F329 Major depressive disorder, single episode, unspecified: Secondary | ICD-10-CM | POA: Diagnosis not present

## 2016-10-28 DIAGNOSIS — G904 Autonomic dysreflexia: Secondary | ICD-10-CM | POA: Diagnosis not present

## 2016-10-28 DIAGNOSIS — Z7901 Long term (current) use of anticoagulants: Secondary | ICD-10-CM | POA: Insufficient documentation

## 2016-10-28 DIAGNOSIS — F419 Anxiety disorder, unspecified: Secondary | ICD-10-CM | POA: Diagnosis not present

## 2016-10-28 DIAGNOSIS — S14104S Unspecified injury at C4 level of cervical spinal cord, sequela: Secondary | ICD-10-CM | POA: Insufficient documentation

## 2016-10-28 DIAGNOSIS — K592 Neurogenic bowel, not elsewhere classified: Secondary | ICD-10-CM | POA: Diagnosis not present

## 2016-10-28 DIAGNOSIS — G8250 Quadriplegia, unspecified: Secondary | ICD-10-CM

## 2016-10-28 DIAGNOSIS — S14101S Unspecified injury at C1 level of cervical spinal cord, sequela: Secondary | ICD-10-CM | POA: Diagnosis not present

## 2016-10-28 MED ORDER — GABAPENTIN 300 MG PO CAPS: ORAL_CAPSULE | ORAL | 1 refills | Status: DC

## 2016-10-28 MED ORDER — OXYCODONE HCL 5 MG PO TABS: 5.0000 mg | ORAL_TABLET | Freq: Four times a day (QID) | ORAL | 0 refills | Status: DC | PRN

## 2016-10-28 MED ORDER — BACLOFEN 20 MG PO TABS
20.0000 mg | ORAL_TABLET | Freq: Four times a day (QID) | ORAL | 2 refills | Status: DC
Start: 2016-10-28 — End: 2016-12-24

## 2016-10-28 MED ORDER — OXYCODONE HCL ER 10 MG PO T12A: 10.0000 mg | EXTENDED_RELEASE_TABLET | Freq: Two times a day (BID) | ORAL | 0 refills | Status: DC

## 2016-10-28 NOTE — Patient Instructions (Signed)
CONTINUE TO WORK ON YOUR WALKING AT HOME.   YOU ARE USING GOOD TECHNIQUE  WALKING IN THE POOL WOULD BE GOOD TOO   PLEASE FEEL FREE TO CALL OUR OFFICE WITH ANY PROBLEMS OR QUESTIONS [phone])
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
## 2016-10-29 ENCOUNTER — Ambulatory Visit: Payer: Medicaid Other | Admitting: Physical Therapy

## 2016-10-29 ENCOUNTER — Other Ambulatory Visit: Payer: Self-pay | Admitting: Registered Nurse

## 2016-10-29 NOTE — Telephone Encounter (Signed)
recieved electronic refill request for pantoprazole, no mention in previous notes as to continue this mediation, please advise

## 2016-11-01 ENCOUNTER — Other Ambulatory Visit: Payer: Self-pay

## 2016-11-01 DIAGNOSIS — G8250 Quadriplegia, unspecified: Secondary | ICD-10-CM

## 2016-11-05 ENCOUNTER — Other Ambulatory Visit: Payer: Self-pay

## 2016-11-05 ENCOUNTER — Ambulatory Visit: Payer: Self-pay | Admitting: Physical Therapy

## 2016-11-05 NOTE — Telephone Encounter (Signed)
Recieved electronic medication refill request for pantoprazole, no mention in previous notes as to continue this medication, please advise.

## 2016-11-06 MED ORDER — PANTOPRAZOLE SODIUM 20 MG PO TBEC: 20.0000 mg | DELAYED_RELEASE_TABLET | Freq: Every day | ORAL | 1 refills | Status: DC

## 2016-11-06 NOTE — Telephone Encounter (Signed)
Pt can stop this unless he's having reflux or stomach upset

## 2016-11-06 NOTE — Telephone Encounter (Signed)
Called patient to confirm symptoms, states still does have these issues due to medications upsetting his stomach, has verbally asked for a refill of said medication, medicine was reordered with 1 refill

## 2016-11-07 NOTE — Telephone Encounter (Signed)
Dr Riley Kill pt also seen by Riley Lam , please ask one of them

## 2016-11-07 NOTE — Telephone Encounter (Signed)
My apologies, not sure why or how that got sent to doctor Kirsteins, was closing out note

## 2016-11-08 ENCOUNTER — Ambulatory Visit: Payer: Medicaid Other | Admitting: Physical Therapy

## 2016-11-08 DIAGNOSIS — G8252 Quadriplegia, C1-C4 incomplete: Secondary | ICD-10-CM | POA: Diagnosis not present

## 2016-11-08 DIAGNOSIS — R2689 Other abnormalities of gait and mobility: Secondary | ICD-10-CM
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
## 2016-11-12 ENCOUNTER — Ambulatory Visit: Payer: Medicaid Other | Admitting: Physical Therapy

## 2016-11-12 DIAGNOSIS — R2689 Other abnormalities of gait and mobility: Secondary | ICD-10-CM

## 2016-11-12 DIAGNOSIS — G8252 Quadriplegia, C1-C4 incomplete: Secondary | ICD-10-CM | POA: Diagnosis not present

## 2016-11-12 DIAGNOSIS — S14101A Unspecified injury at C1 level of cervical spinal cord, initial encounter: Secondary | ICD-10-CM | POA: Diagnosis not present
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
## 2016-11-14 DIAGNOSIS — M79674 Pain in right toe(s): Secondary | ICD-10-CM | POA: Diagnosis not present

## 2016-11-14 DIAGNOSIS — L03031 Cellulitis of right toe: Secondary | ICD-10-CM | POA: Diagnosis not present

## 2016-11-19 ENCOUNTER — Ambulatory Visit: Payer: Medicaid Other | Attending: Registered Nurse | Admitting: Physical Therapy

## 2016-11-19 DIAGNOSIS — R2689 Other abnormalities of gait and mobility: Secondary | ICD-10-CM | POA: Diagnosis present

## 2016-11-19 DIAGNOSIS — X58XXXS Exposure to other specified factors, sequela: Secondary | ICD-10-CM | POA: Diagnosis not present

## 2016-11-19 DIAGNOSIS — S14101S Unspecified injury at C1 level of cervical spinal cord, sequela: Secondary | ICD-10-CM
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
## 2016-11-26 ENCOUNTER — Telehealth: Payer: Self-pay | Admitting: Registered Nurse

## 2016-11-26 DIAGNOSIS — G8250 Quadriplegia, unspecified: Secondary | ICD-10-CM

## 2016-11-26 DIAGNOSIS — G904 Autonomic dysreflexia: Secondary | ICD-10-CM

## 2016-11-26 DIAGNOSIS — S14101S Unspecified injury at C1 level of cervical spinal cord, sequela: Secondary | ICD-10-CM

## 2016-11-26 DIAGNOSIS — N319 Neuromuscular dysfunction of bladder, unspecified: Secondary | ICD-10-CM

## 2016-11-26 MED ORDER — OXYCODONE HCL 5 MG PO TABS: 5.0000 mg | ORAL_TABLET | Freq: Four times a day (QID) | ORAL | 0 refills | Status: DC | PRN

## 2016-11-26 MED ORDER — OXYCODONE HCL ER 10 MG PO T12A: 10.0000 mg | EXTENDED_RELEASE_TABLET | Freq: Two times a day (BID) | ORAL | 0 refills | Status: DC

## 2016-11-26 NOTE — Telephone Encounter (Signed)
According to [facility]NCCSR was reviewed, Oxycontin and Oxycodone was picked up on 10/29/2016. Prescriptions was printed.

## 2016-11-27 ENCOUNTER — Encounter: Payer: Medicaid Other | Attending: Physical Medicine & Rehabilitation | Admitting: Registered Nurse

## 2016-11-27 ENCOUNTER — Telehealth: Payer: Self-pay | Admitting: Registered Nurse

## 2016-11-27 ENCOUNTER — Encounter: Payer: Self-pay | Admitting: Registered Nurse

## 2016-11-27 VITALS — BP 117/82 | HR 87

## 2016-11-27 DIAGNOSIS — Z79899 Other long term (current) drug therapy: Secondary | ICD-10-CM | POA: Diagnosis not present

## 2016-11-27 DIAGNOSIS — R252 Cramp and spasm: Secondary | ICD-10-CM | POA: Insufficient documentation

## 2016-11-27 DIAGNOSIS — S14101S Unspecified injury at C1 level of cervical spinal cord, sequela: Secondary | ICD-10-CM | POA: Diagnosis not present

## 2016-11-27 DIAGNOSIS — M62838 Other muscle spasm: Secondary | ICD-10-CM

## 2016-11-27 DIAGNOSIS — G8250 Quadriplegia, unspecified: Secondary | ICD-10-CM | POA: Diagnosis not present

## 2016-11-27 DIAGNOSIS — S14104S Unspecified injury at C4 level of cervical spinal cord, sequela: Secondary | ICD-10-CM | POA: Insufficient documentation

## 2016-11-27 DIAGNOSIS — F17200 Nicotine dependence, unspecified, uncomplicated: Secondary | ICD-10-CM | POA: Insufficient documentation

## 2016-11-27 DIAGNOSIS — G894 Chronic pain syndrome: Secondary | ICD-10-CM | POA: Diagnosis not present

## 2016-11-27 DIAGNOSIS — K592 Neurogenic bowel, not elsewhere classified: Secondary | ICD-10-CM | POA: Diagnosis not present

## 2016-11-27 DIAGNOSIS — Z7901 Long term (current) use of anticoagulants: Secondary | ICD-10-CM | POA: Diagnosis not present

## 2016-11-27 DIAGNOSIS — Z5181 Encounter for therapeutic drug level monitoring: Secondary | ICD-10-CM

## 2016-11-27 DIAGNOSIS — Z981 Arthrodesis status: Secondary | ICD-10-CM | POA: Diagnosis not present

## 2016-11-27 DIAGNOSIS — F329 Major depressive disorder, single episode, unspecified: Secondary | ICD-10-CM | POA: Diagnosis not present

## 2016-11-27 DIAGNOSIS — N319 Neuromuscular dysfunction of bladder, unspecified: Secondary | ICD-10-CM

## 2016-11-27 DIAGNOSIS — F419 Anxiety disorder, unspecified: Secondary | ICD-10-CM | POA: Insufficient documentation

## 2016-11-27 MED ORDER — TIZANIDINE HCL 4 MG PO TABS: 4.0000 mg | ORAL_TABLET | Freq: Three times a day (TID) | ORAL | 2 refills | Status: DC

## 2016-11-27 MED ORDER — CLONAZEPAM 0.5 MG PO TABS: 0.5000 mg | ORAL_TABLET | Freq: Three times a day (TID) | ORAL | 2 refills | Status: DC
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
## 2016-11-27 NOTE — Telephone Encounter (Signed)
On 11/27/2016 the NCCSR was reviewed no conflict was seen on the [facility] [place] ControlledSubstance Reporting System with multiple prescribers.  [name] has a signed narcotic contract with our office. If there were any discrepancies this would have been reported to his physician.

## 2016-12-15 ENCOUNTER — Other Ambulatory Visit: Payer: Self-pay | Admitting: Physical Medicine & Rehabilitation

## 2016-12-15 DIAGNOSIS — G904 Autonomic dysreflexia: Secondary | ICD-10-CM

## 2016-12-15 DIAGNOSIS — G8250 Quadriplegia, unspecified: Secondary | ICD-10-CM

## 2016-12-15 DIAGNOSIS — S14101S Unspecified injury at C1 level of cervical spinal cord, sequela: Secondary | ICD-10-CM

## 2016-12-15 DIAGNOSIS — N319 Neuromuscular dysfunction of bladder, unspecified: Secondary | ICD-10-CM

## 2016-12-20 ENCOUNTER — Other Ambulatory Visit: Payer: Self-pay

## 2016-12-24 ENCOUNTER — Encounter: Payer: Medicaid Other | Attending: Physical Medicine & Rehabilitation | Admitting: Registered Nurse

## 2016-12-24 ENCOUNTER — Ambulatory Visit: Payer: Medicaid Other | Attending: Registered Nurse | Admitting: Physical Therapy

## 2016-12-24 ENCOUNTER — Encounter: Payer: Self-pay | Admitting: Registered Nurse

## 2016-12-24 VITALS — BP 111/73 | HR 75 | Resp 14

## 2016-12-24 DIAGNOSIS — R2689 Other abnormalities of gait and mobility: Secondary | ICD-10-CM | POA: Insufficient documentation

## 2016-12-24 DIAGNOSIS — G904 Autonomic dysreflexia: Secondary | ICD-10-CM

## 2016-12-24 DIAGNOSIS — F17200 Nicotine dependence, unspecified, uncomplicated: Secondary | ICD-10-CM | POA: Insufficient documentation

## 2016-12-24 DIAGNOSIS — Z7901 Long term (current) use of anticoagulants: Secondary | ICD-10-CM | POA: Diagnosis not present

## 2016-12-24 DIAGNOSIS — F329 Major depressive disorder, single episode, unspecified: Secondary | ICD-10-CM | POA: Insufficient documentation

## 2016-12-24 DIAGNOSIS — S14104S Unspecified injury at C4 level of cervical spinal cord, sequela: Secondary | ICD-10-CM | POA: Insufficient documentation

## 2016-12-24 DIAGNOSIS — Z79899 Other long term (current) drug therapy: Secondary | ICD-10-CM

## 2016-12-24 DIAGNOSIS — F419 Anxiety disorder, unspecified: Secondary | ICD-10-CM | POA: Insufficient documentation

## 2016-12-24 DIAGNOSIS — M62838 Other muscle spasm: Secondary | ICD-10-CM

## 2016-12-24 DIAGNOSIS — S14101S Unspecified injury at C1 level of cervical spinal cord, sequela: Secondary | ICD-10-CM | POA: Diagnosis not present

## 2016-12-24 DIAGNOSIS — M25561 Pain in right knee: Secondary | ICD-10-CM

## 2016-12-24 DIAGNOSIS — G8250 Quadriplegia, unspecified: Secondary | ICD-10-CM

## 2016-12-24 DIAGNOSIS — Z981 Arthrodesis status: Secondary | ICD-10-CM | POA: Diagnosis not present

## 2016-12-24 DIAGNOSIS — Z5181 Encounter for therapeutic drug level monitoring: Secondary | ICD-10-CM | POA: Diagnosis not present

## 2016-12-24 DIAGNOSIS — R252 Cramp and spasm: Secondary | ICD-10-CM | POA: Diagnosis not present

## 2016-12-24 DIAGNOSIS — N319 Neuromuscular dysfunction of bladder, unspecified: Secondary | ICD-10-CM | POA: Insufficient documentation

## 2016-12-24 DIAGNOSIS — M25562 Pain in left knee: Secondary | ICD-10-CM

## 2016-12-24 DIAGNOSIS — G894 Chronic pain syndrome: Secondary | ICD-10-CM | POA: Diagnosis not present

## 2016-12-24 DIAGNOSIS — K592 Neurogenic bowel, not elsewhere classified: Secondary | ICD-10-CM | POA: Diagnosis not present

## 2016-12-24 MED ORDER — BACLOFEN 20 MG PO TABS
20.0000 mg | ORAL_TABLET | Freq: Four times a day (QID) | ORAL | 2 refills | Status: DC
Start: 2016-12-24 — End: 2017-03-19

## 2016-12-24 MED ORDER — OXYCODONE HCL ER 10 MG PO T12A: 10.0000 mg | EXTENDED_RELEASE_TABLET | Freq: Two times a day (BID) | ORAL | 0 refills | Status: DC

## 2016-12-24 MED ORDER — POLYETHYLENE GLYCOL 3350 17 G PO PACK: 17.0000 g | PACK | Freq: Every day | ORAL | 3 refills | Status: DC

## 2016-12-24 MED ORDER — OXYCODONE HCL 5 MG PO TABS: 5.0000 mg | ORAL_TABLET | Freq: Four times a day (QID) | ORAL | 0 refills | Status: DC | PRN
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
## 2016-12-26 ENCOUNTER — Other Ambulatory Visit: Payer: Self-pay | Admitting: Physical Medicine & Rehabilitation

## 2016-12-27 LAB — TOXASSURE SELECT,+ANTIDEPR,UR

## 2016-12-30 ENCOUNTER — Telehealth: Payer: Self-pay | Admitting: *Deleted

## 2016-12-30 NOTE — Telephone Encounter (Signed)
Urine drug screen for this encounter is consistent for prescribed medication 

## 2017-01-09 ENCOUNTER — Other Ambulatory Visit: Payer: Self-pay | Admitting: Physical Medicine & Rehabilitation

## 2017-01-09 DIAGNOSIS — G904 Autonomic dysreflexia: Secondary | ICD-10-CM

## 2017-01-09 DIAGNOSIS — G8250 Quadriplegia, unspecified: Secondary | ICD-10-CM

## 2017-01-09 DIAGNOSIS — S14101S Unspecified injury at C1 level of cervical spinal cord, sequela: Secondary | ICD-10-CM

## 2017-01-09 DIAGNOSIS — N319 Neuromuscular dysfunction of bladder, unspecified: Secondary | ICD-10-CM

## 2017-01-10 ENCOUNTER — Other Ambulatory Visit: Payer: Self-pay

## 2017-01-10 MED ORDER — SENNOSIDES-DOCUSATE SODIUM 8.6-50 MG PO TABS: 2.0000 | ORAL_TABLET | ORAL | 1 refills | Status: AC

## 2017-01-20 ENCOUNTER — Encounter: Payer: Self-pay | Admitting: Registered Nurse

## 2017-01-20 ENCOUNTER — Encounter: Payer: Medicaid Other | Attending: Physical Medicine & Rehabilitation | Admitting: Registered Nurse

## 2017-01-20 VITALS — BP 103/75 | HR 85 | Resp 14

## 2017-01-20 DIAGNOSIS — Z79899 Other long term (current) drug therapy: Secondary | ICD-10-CM | POA: Diagnosis not present

## 2017-01-20 DIAGNOSIS — K592 Neurogenic bowel, not elsewhere classified: Secondary | ICD-10-CM | POA: Diagnosis not present

## 2017-01-20 DIAGNOSIS — G8250 Quadriplegia, unspecified: Secondary | ICD-10-CM

## 2017-01-20 DIAGNOSIS — S14101S Unspecified injury at C1 level of cervical spinal cord, sequela: Secondary | ICD-10-CM

## 2017-01-20 DIAGNOSIS — Z7901 Long term (current) use of anticoagulants: Secondary | ICD-10-CM | POA: Insufficient documentation

## 2017-01-20 DIAGNOSIS — Z981 Arthrodesis status: Secondary | ICD-10-CM | POA: Diagnosis not present

## 2017-01-20 DIAGNOSIS — G894 Chronic pain syndrome: Secondary | ICD-10-CM

## 2017-01-20 DIAGNOSIS — S14104S Unspecified injury at C4 level of cervical spinal cord, sequela: Secondary | ICD-10-CM | POA: Insufficient documentation

## 2017-01-20 DIAGNOSIS — Z5181 Encounter for therapeutic drug level monitoring: Secondary | ICD-10-CM | POA: Diagnosis not present

## 2017-01-20 DIAGNOSIS — N319 Neuromuscular dysfunction of bladder, unspecified: Secondary | ICD-10-CM

## 2017-01-20 DIAGNOSIS — R252 Cramp and spasm: Secondary | ICD-10-CM | POA: Diagnosis not present

## 2017-01-20 DIAGNOSIS — M62838 Other muscle spasm: Secondary | ICD-10-CM

## 2017-01-20 DIAGNOSIS — F17200 Nicotine dependence, unspecified, uncomplicated: Secondary | ICD-10-CM | POA: Insufficient documentation

## 2017-01-20 DIAGNOSIS — F329 Major depressive disorder, single episode, unspecified: Secondary | ICD-10-CM | POA: Insufficient documentation

## 2017-01-20 DIAGNOSIS — F419 Anxiety disorder, unspecified: Secondary | ICD-10-CM | POA: Insufficient documentation

## 2017-01-20 MED ORDER — OXYCODONE HCL ER 10 MG PO T12A: 10.0000 mg | EXTENDED_RELEASE_TABLET | Freq: Two times a day (BID) | ORAL | 0 refills | Status: DC

## 2017-01-20 MED ORDER — OXYCODONE HCL 5 MG PO TABS: 5.0000 mg | ORAL_TABLET | Freq: Four times a day (QID) | ORAL | 0 refills | Status: DC | PRN

## 2017-01-20 MED ORDER — CLONAZEPAM 0.5 MG PO TABS: 0.5000 mg | ORAL_TABLET | Freq: Three times a day (TID) | ORAL | 2 refills | Status: DC
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
## 2017-01-24 ENCOUNTER — Other Ambulatory Visit: Payer: Self-pay | Admitting: Physical Medicine & Rehabilitation

## 2017-02-19 ENCOUNTER — Encounter: Payer: Self-pay | Admitting: Registered Nurse

## 2017-02-19 ENCOUNTER — Encounter: Payer: Medicaid Other | Attending: Physical Medicine & Rehabilitation | Admitting: Registered Nurse

## 2017-02-19 VITALS — BP 124/83 | HR 72

## 2017-02-19 DIAGNOSIS — Z5181 Encounter for therapeutic drug level monitoring: Secondary | ICD-10-CM | POA: Diagnosis not present

## 2017-02-19 DIAGNOSIS — M62838 Other muscle spasm: Secondary | ICD-10-CM | POA: Diagnosis not present

## 2017-02-19 DIAGNOSIS — F419 Anxiety disorder, unspecified: Secondary | ICD-10-CM | POA: Diagnosis not present

## 2017-02-19 DIAGNOSIS — S14101S Unspecified injury at C1 level of cervical spinal cord, sequela: Secondary | ICD-10-CM

## 2017-02-19 DIAGNOSIS — Z7901 Long term (current) use of anticoagulants: Secondary | ICD-10-CM | POA: Insufficient documentation

## 2017-02-19 DIAGNOSIS — Z981 Arthrodesis status: Secondary | ICD-10-CM | POA: Insufficient documentation

## 2017-02-19 DIAGNOSIS — Z79899 Other long term (current) drug therapy: Secondary | ICD-10-CM | POA: Diagnosis not present

## 2017-02-19 DIAGNOSIS — G8250 Quadriplegia, unspecified: Secondary | ICD-10-CM

## 2017-02-19 DIAGNOSIS — R252 Cramp and spasm: Secondary | ICD-10-CM | POA: Diagnosis not present

## 2017-02-19 DIAGNOSIS — M792 Neuralgia and neuritis, unspecified: Secondary | ICD-10-CM | POA: Diagnosis not present

## 2017-02-19 DIAGNOSIS — F17200 Nicotine dependence, unspecified, uncomplicated: Secondary | ICD-10-CM | POA: Insufficient documentation

## 2017-02-19 DIAGNOSIS — N319 Neuromuscular dysfunction of bladder, unspecified: Secondary | ICD-10-CM | POA: Diagnosis not present

## 2017-02-19 DIAGNOSIS — K592 Neurogenic bowel, not elsewhere classified: Secondary | ICD-10-CM | POA: Insufficient documentation

## 2017-02-19 DIAGNOSIS — F329 Major depressive disorder, single episode, unspecified: Secondary | ICD-10-CM | POA: Insufficient documentation

## 2017-02-19 DIAGNOSIS — S14104S Unspecified injury at C4 level of cervical spinal cord, sequela: Secondary | ICD-10-CM | POA: Insufficient documentation

## 2017-02-19 MED ORDER — OXYCODONE HCL ER 10 MG PO T12A: 10.0000 mg | EXTENDED_RELEASE_TABLET | Freq: Two times a day (BID) | ORAL | 0 refills | Status: DC

## 2017-02-19 MED ORDER — OXYCODONE HCL 5 MG PO TABS: 5.0000 mg | ORAL_TABLET | Freq: Three times a day (TID) | ORAL | 0 refills | Status: DC | PRN
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
## 2017-02-19 NOTE — Patient Instructions (Signed)
Increase Baclofen to 30 mg ( 11/2 tablets) four times a day, increase medication when wife is at home to evaluate .   Call office in two weeks to discuss medication change.

## 2017-02-20 ENCOUNTER — Telehealth: Payer: Self-pay

## 2017-02-20 NOTE — Telephone Encounter (Signed)
Recieved fax from patients pharmacy stating is needing a prior authorization to start the oxycodone instant relaease, PA started and faxed to [place] [facility]

## 2017-02-21 NOTE — Telephone Encounter (Signed)
[place] [facility] prior auth approved for oxycodone 5 mg # 80  Confirmation #:[id number]  Prior Approval  #:[id number]#:[id number] Effective Begin Date: 02/20/2017 Effective End Date:08/19/2017 .  Left message on VM per DPR instructions.

## 2017-03-19 ENCOUNTER — Encounter (HOSPITAL_BASED_OUTPATIENT_CLINIC_OR_DEPARTMENT_OTHER): Payer: Medicaid Other | Admitting: Registered Nurse

## 2017-03-19 VITALS — BP 116/81 | HR 95

## 2017-03-19 DIAGNOSIS — R252 Cramp and spasm: Secondary | ICD-10-CM | POA: Diagnosis not present

## 2017-03-19 DIAGNOSIS — G8250 Quadriplegia, unspecified: Secondary | ICD-10-CM

## 2017-03-19 DIAGNOSIS — F17200 Nicotine dependence, unspecified, uncomplicated: Secondary | ICD-10-CM | POA: Diagnosis not present

## 2017-03-19 DIAGNOSIS — S14101S Unspecified injury at C1 level of cervical spinal cord, sequela: Secondary | ICD-10-CM | POA: Diagnosis not present

## 2017-03-19 DIAGNOSIS — M792 Neuralgia and neuritis, unspecified: Secondary | ICD-10-CM

## 2017-03-19 DIAGNOSIS — Z5181 Encounter for therapeutic drug level monitoring: Secondary | ICD-10-CM | POA: Diagnosis not present

## 2017-03-19 DIAGNOSIS — Z79899 Other long term (current) drug therapy: Secondary | ICD-10-CM

## 2017-03-19 DIAGNOSIS — M62838 Other muscle spasm: Secondary | ICD-10-CM | POA: Diagnosis not present

## 2017-03-19 DIAGNOSIS — G904 Autonomic dysreflexia: Secondary | ICD-10-CM

## 2017-03-19 DIAGNOSIS — F329 Major depressive disorder, single episode, unspecified: Secondary | ICD-10-CM | POA: Diagnosis not present

## 2017-03-19 DIAGNOSIS — F419 Anxiety disorder, unspecified: Secondary | ICD-10-CM | POA: Diagnosis not present

## 2017-03-19 DIAGNOSIS — K592 Neurogenic bowel, not elsewhere classified: Secondary | ICD-10-CM | POA: Diagnosis not present

## 2017-03-19 DIAGNOSIS — N319 Neuromuscular dysfunction of bladder, unspecified: Secondary | ICD-10-CM | POA: Diagnosis not present

## 2017-03-19 DIAGNOSIS — Z981 Arthrodesis status: Secondary | ICD-10-CM | POA: Diagnosis not present

## 2017-03-19 DIAGNOSIS — S14104S Unspecified injury at C4 level of cervical spinal cord, sequela: Secondary | ICD-10-CM | POA: Diagnosis not present

## 2017-03-19 DIAGNOSIS — Z7901 Long term (current) use of anticoagulants: Secondary | ICD-10-CM | POA: Diagnosis not present

## 2017-03-19 MED ORDER — OXYCODONE HCL ER 10 MG PO T12A: 10.0000 mg | EXTENDED_RELEASE_TABLET | Freq: Two times a day (BID) | ORAL | 0 refills | Status: DC

## 2017-03-19 MED ORDER — BACLOFEN 20 MG PO TABS: ORAL_TABLET | ORAL | 2 refills | Status: DC

## 2017-03-19 MED ORDER — OXYCODONE HCL 5 MG PO TABS: 5.0000 mg | ORAL_TABLET | Freq: Three times a day (TID) | ORAL | 0 refills | Status: DC | PRN
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
## 2017-03-26 ENCOUNTER — Encounter: Payer: Self-pay | Admitting: Registered Nurse

## 2017-03-26 ENCOUNTER — Telehealth: Payer: Self-pay | Admitting: Registered Nurse

## 2017-03-26 NOTE — Telephone Encounter (Signed)
On 03/26/2017 the NCCSR was reviewed no conflict was seen on the [facility] Cont[place] Substance Reporting System with multiple prescribers. [name] has a signed narcotic contract with our office. If there were any discrepancies this would have been reported to his  physician.

## 2017-04-01 ENCOUNTER — Other Ambulatory Visit: Payer: Self-pay | Admitting: Physical Medicine & Rehabilitation

## 2017-04-01 ENCOUNTER — Other Ambulatory Visit: Payer: Self-pay | Admitting: Registered Nurse

## 2017-04-01 DIAGNOSIS — S14101S Unspecified injury at C1 level of cervical spinal cord, sequela: Secondary | ICD-10-CM

## 2017-04-01 DIAGNOSIS — G8250 Quadriplegia, unspecified: Secondary | ICD-10-CM

## 2017-04-01 DIAGNOSIS — N319 Neuromuscular dysfunction of bladder, unspecified: Secondary | ICD-10-CM

## 2017-04-01 DIAGNOSIS — G904 Autonomic dysreflexia: Secondary | ICD-10-CM

## 2017-04-11 ENCOUNTER — Other Ambulatory Visit: Payer: Self-pay | Admitting: Physical Medicine & Rehabilitation

## 2017-04-16 ENCOUNTER — Encounter: Payer: Medicaid Other | Attending: Physical Medicine & Rehabilitation | Admitting: Registered Nurse

## 2017-04-16 ENCOUNTER — Encounter: Payer: Self-pay | Admitting: Registered Nurse

## 2017-04-16 VITALS — BP 115/81 | HR 100

## 2017-04-16 DIAGNOSIS — S14104S Unspecified injury at C4 level of cervical spinal cord, sequela: Secondary | ICD-10-CM | POA: Insufficient documentation

## 2017-04-16 DIAGNOSIS — R252 Cramp and spasm: Secondary | ICD-10-CM | POA: Insufficient documentation

## 2017-04-16 DIAGNOSIS — N319 Neuromuscular dysfunction of bladder, unspecified: Secondary | ICD-10-CM | POA: Insufficient documentation

## 2017-04-16 DIAGNOSIS — Z981 Arthrodesis status: Secondary | ICD-10-CM | POA: Diagnosis not present

## 2017-04-16 DIAGNOSIS — K592 Neurogenic bowel, not elsewhere classified: Secondary | ICD-10-CM | POA: Insufficient documentation

## 2017-04-16 DIAGNOSIS — Z79899 Other long term (current) drug therapy: Secondary | ICD-10-CM

## 2017-04-16 DIAGNOSIS — Z5181 Encounter for therapeutic drug level monitoring: Secondary | ICD-10-CM | POA: Diagnosis not present

## 2017-04-16 DIAGNOSIS — G894 Chronic pain syndrome: Secondary | ICD-10-CM | POA: Diagnosis not present

## 2017-04-16 DIAGNOSIS — F17200 Nicotine dependence, unspecified, uncomplicated: Secondary | ICD-10-CM | POA: Diagnosis not present

## 2017-04-16 DIAGNOSIS — S14101S Unspecified injury at C1 level of cervical spinal cord, sequela: Secondary | ICD-10-CM

## 2017-04-16 DIAGNOSIS — G8250 Quadriplegia, unspecified: Secondary | ICD-10-CM

## 2017-04-16 DIAGNOSIS — F419 Anxiety disorder, unspecified: Secondary | ICD-10-CM | POA: Insufficient documentation

## 2017-04-16 DIAGNOSIS — M62838 Other muscle spasm: Secondary | ICD-10-CM | POA: Diagnosis not present

## 2017-04-16 DIAGNOSIS — F329 Major depressive disorder, single episode, unspecified: Secondary | ICD-10-CM | POA: Insufficient documentation

## 2017-04-16 DIAGNOSIS — Z7901 Long term (current) use of anticoagulants: Secondary | ICD-10-CM | POA: Insufficient documentation

## 2017-04-16 MED ORDER — OXYCODONE HCL 5 MG PO TABS: 5.0000 mg | ORAL_TABLET | Freq: Three times a day (TID) | ORAL | 0 refills | Status: DC | PRN

## 2017-04-16 MED ORDER — CLONAZEPAM 0.5 MG PO TABS: 0.5000 mg | ORAL_TABLET | Freq: Three times a day (TID) | ORAL | 2 refills | Status: DC

## 2017-04-16 MED ORDER — OXYCODONE HCL ER 10 MG PO T12A: 10.0000 mg | EXTENDED_RELEASE_TABLET | Freq: Every day | ORAL | 0 refills | Status: DC
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
## 2017-05-12 ENCOUNTER — Encounter: Payer: Self-pay | Admitting: Physical Medicine & Rehabilitation

## 2017-05-12 ENCOUNTER — Encounter: Payer: Medicaid Other | Attending: Physical Medicine & Rehabilitation | Admitting: Physical Medicine & Rehabilitation

## 2017-05-12 VITALS — BP 125/84 | HR 82

## 2017-05-12 DIAGNOSIS — S14104S Unspecified injury at C4 level of cervical spinal cord, sequela: Secondary | ICD-10-CM | POA: Diagnosis not present

## 2017-05-12 DIAGNOSIS — F17200 Nicotine dependence, unspecified, uncomplicated: Secondary | ICD-10-CM | POA: Diagnosis not present

## 2017-05-12 DIAGNOSIS — F419 Anxiety disorder, unspecified: Secondary | ICD-10-CM | POA: Diagnosis not present

## 2017-05-12 DIAGNOSIS — Z981 Arthrodesis status: Secondary | ICD-10-CM | POA: Insufficient documentation

## 2017-05-12 DIAGNOSIS — G8250 Quadriplegia, unspecified: Secondary | ICD-10-CM

## 2017-05-12 DIAGNOSIS — M792 Neuralgia and neuritis, unspecified: Secondary | ICD-10-CM

## 2017-05-12 DIAGNOSIS — Z7901 Long term (current) use of anticoagulants: Secondary | ICD-10-CM | POA: Diagnosis not present

## 2017-05-12 DIAGNOSIS — K592 Neurogenic bowel, not elsewhere classified: Secondary | ICD-10-CM | POA: Diagnosis not present

## 2017-05-12 DIAGNOSIS — F329 Major depressive disorder, single episode, unspecified: Secondary | ICD-10-CM | POA: Diagnosis not present

## 2017-05-12 DIAGNOSIS — S14101S Unspecified injury at C1 level of cervical spinal cord, sequela: Secondary | ICD-10-CM | POA: Diagnosis not present

## 2017-05-12 DIAGNOSIS — R252 Cramp and spasm: Secondary | ICD-10-CM | POA: Diagnosis not present

## 2017-05-12 DIAGNOSIS — N319 Neuromuscular dysfunction of bladder, unspecified: Secondary | ICD-10-CM | POA: Insufficient documentation

## 2017-05-12 MED ORDER — DICLOFENAC SODIUM 75 MG PO TBEC: 75.0000 mg | DELAYED_RELEASE_TABLET | Freq: Two times a day (BID) | ORAL | 3 refills | Status: DC

## 2017-05-12 MED ORDER — OXYCODONE HCL ER 10 MG PO T12A: 10.0000 mg | EXTENDED_RELEASE_TABLET | Freq: Every day | ORAL | 0 refills | Status: DC

## 2017-05-12 MED ORDER — OXYCODONE HCL 5 MG PO TABS: 5.0000 mg | ORAL_TABLET | Freq: Three times a day (TID) | ORAL | 0 refills | Status: DC | PRN

## 2017-05-12 NOTE — Patient Instructions (Signed)
CONTINUE TO WORK ON HIP EXTENDING EXERCISES TO FIRE YOUR BUTT MUSCLES. POOL WALKING WOULD BE GREAT.    PLEASE FEEL FREE TO CALL OUR OFFICE WITH ANY PROBLEMS OR QUESTIONS [phone])
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
## 2017-05-15 ENCOUNTER — Telehealth: Payer: Self-pay | Admitting: *Deleted

## 2017-05-15 NOTE — Telephone Encounter (Signed)
Prior authorization submitted to [facility] for diclofenac sodium 75 mg

## 2017-05-16 ENCOUNTER — Telehealth: Payer: Self-pay

## 2017-05-16 NOTE — Telephone Encounter (Signed)
Error

## 2017-05-16 NOTE — Telephone Encounter (Signed)
Prior Auth imitated for short term and long term opioids

## 2017-05-16 NOTE — Telephone Encounter (Signed)
Patients wife called, stated they just found out that his insurance had run out last week and needed to be reinstated, I informed her of the good rx option and she stated that she if very happy to have that info and is in on way to retrieve paper prescriptions now and get meds filled

## 2017-05-19 ENCOUNTER — Ambulatory Visit: Payer: Self-pay | Admitting: Physical Medicine & Rehabilitation

## 2017-05-20 NOTE — Telephone Encounter (Signed)
Diclofenac approved by [employer] for one year (05/15/17-05/10/18) Confirmation # [id number]

## 2017-05-23 ENCOUNTER — Telehealth: Payer: Self-pay | Admitting: *Deleted

## 2017-05-23 NOTE — Telephone Encounter (Signed)
Ms Edward [name] called concerned that they [facility] was [facility].  I notified her that we applied for auth on both oxy ER and oxycontin.  The oxycontin was approved . Oxycodone 5 mg was approved 05/16/17- 11/17/2017  Confirmation #:[id number] F   Oxycontin 10 mg was approved 05/19/17-010/24/2019  Confirmation #[id number] F

## 2017-06-01 ENCOUNTER — Other Ambulatory Visit: Payer: Self-pay | Admitting: Registered Nurse

## 2017-06-01 DIAGNOSIS — S14101S Unspecified injury at C1 level of cervical spinal cord, sequela: Secondary | ICD-10-CM

## 2017-06-01 DIAGNOSIS — N319 Neuromuscular dysfunction of bladder, unspecified: Secondary | ICD-10-CM

## 2017-06-01 DIAGNOSIS — G904 Autonomic dysreflexia: Secondary | ICD-10-CM

## 2017-06-01 DIAGNOSIS — G8250 Quadriplegia, unspecified: Secondary | ICD-10-CM

## 2017-06-02 NOTE — Telephone Encounter (Signed)
Recieved an electronic medication refill request for Baclofen, according to last note:  6. Spasticity/clonus: much improved Baclofen 20mg  QID--refilled today  The way it is currently written is for 1.5 tabs Qid which is 30mg  4 times a day, is he still going to use this medication at 30mg  or 20mg ?   please advise

## 2017-06-11 ENCOUNTER — Encounter: Payer: Self-pay | Admitting: Registered Nurse

## 2017-06-11 ENCOUNTER — Other Ambulatory Visit: Payer: Self-pay

## 2017-06-11 ENCOUNTER — Encounter: Payer: Medicaid Other | Attending: Physical Medicine & Rehabilitation | Admitting: Registered Nurse

## 2017-06-11 VITALS — BP 129/86 | HR 75

## 2017-06-11 DIAGNOSIS — F329 Major depressive disorder, single episode, unspecified: Secondary | ICD-10-CM | POA: Insufficient documentation

## 2017-06-11 DIAGNOSIS — Z7901 Long term (current) use of anticoagulants: Secondary | ICD-10-CM | POA: Insufficient documentation

## 2017-06-11 DIAGNOSIS — G8250 Quadriplegia, unspecified: Secondary | ICD-10-CM | POA: Diagnosis not present

## 2017-06-11 DIAGNOSIS — N319 Neuromuscular dysfunction of bladder, unspecified: Secondary | ICD-10-CM | POA: Diagnosis not present

## 2017-06-11 DIAGNOSIS — Z981 Arthrodesis status: Secondary | ICD-10-CM | POA: Insufficient documentation

## 2017-06-11 DIAGNOSIS — G894 Chronic pain syndrome: Secondary | ICD-10-CM | POA: Diagnosis not present

## 2017-06-11 DIAGNOSIS — Z5181 Encounter for therapeutic drug level monitoring: Secondary | ICD-10-CM | POA: Diagnosis not present

## 2017-06-11 DIAGNOSIS — F419 Anxiety disorder, unspecified: Secondary | ICD-10-CM | POA: Diagnosis not present

## 2017-06-11 DIAGNOSIS — K592 Neurogenic bowel, not elsewhere classified: Secondary | ICD-10-CM | POA: Diagnosis not present

## 2017-06-11 DIAGNOSIS — Z79899 Other long term (current) drug therapy: Secondary | ICD-10-CM

## 2017-06-11 DIAGNOSIS — M792 Neuralgia and neuritis, unspecified: Secondary | ICD-10-CM | POA: Diagnosis not present

## 2017-06-11 DIAGNOSIS — M62838 Other muscle spasm: Secondary | ICD-10-CM

## 2017-06-11 DIAGNOSIS — F17200 Nicotine dependence, unspecified, uncomplicated: Secondary | ICD-10-CM | POA: Diagnosis not present

## 2017-06-11 DIAGNOSIS — R252 Cramp and spasm: Secondary | ICD-10-CM | POA: Insufficient documentation

## 2017-06-11 DIAGNOSIS — S14101S Unspecified injury at C1 level of cervical spinal cord, sequela: Secondary | ICD-10-CM | POA: Diagnosis not present

## 2017-06-11 DIAGNOSIS — S14104S Unspecified injury at C4 level of cervical spinal cord, sequela: Secondary | ICD-10-CM | POA: Diagnosis present

## 2017-06-11 MED ORDER — BACLOFEN 20 MG PO TABS: ORAL_TABLET | ORAL | 2 refills | Status: DC

## 2017-06-11 MED ORDER — CLONAZEPAM 0.5 MG PO TABS: 0.5000 mg | ORAL_TABLET | Freq: Three times a day (TID) | ORAL | 2 refills | Status: DC

## 2017-06-11 MED ORDER — OXYCODONE HCL 5 MG PO TABS: 5.0000 mg | ORAL_TABLET | Freq: Three times a day (TID) | ORAL | 0 refills | Status: DC | PRN

## 2017-06-11 MED ORDER — OXYCODONE HCL ER 10 MG PO T12A: 10.0000 mg | EXTENDED_RELEASE_TABLET | Freq: Every day | ORAL | 0 refills | Status: DC
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
## 2017-06-23 ENCOUNTER — Other Ambulatory Visit: Payer: Self-pay | Admitting: Registered Nurse

## 2017-06-27 ENCOUNTER — Other Ambulatory Visit: Payer: Self-pay | Admitting: Physical Medicine & Rehabilitation

## 2017-07-03 ENCOUNTER — Encounter: Payer: Medicaid Other | Attending: Physical Medicine & Rehabilitation | Admitting: Registered Nurse

## 2017-07-03 ENCOUNTER — Encounter: Payer: Self-pay | Admitting: Registered Nurse

## 2017-07-03 VITALS — BP 126/85 | HR 77

## 2017-07-03 DIAGNOSIS — S14101S Unspecified injury at C1 level of cervical spinal cord, sequela: Secondary | ICD-10-CM

## 2017-07-03 DIAGNOSIS — Z7901 Long term (current) use of anticoagulants: Secondary | ICD-10-CM | POA: Insufficient documentation

## 2017-07-03 DIAGNOSIS — R252 Cramp and spasm: Secondary | ICD-10-CM | POA: Diagnosis not present

## 2017-07-03 DIAGNOSIS — M792 Neuralgia and neuritis, unspecified: Secondary | ICD-10-CM | POA: Diagnosis not present

## 2017-07-03 DIAGNOSIS — Z981 Arthrodesis status: Secondary | ICD-10-CM | POA: Diagnosis not present

## 2017-07-03 DIAGNOSIS — S14104S Unspecified injury at C4 level of cervical spinal cord, sequela: Secondary | ICD-10-CM | POA: Diagnosis present

## 2017-07-03 DIAGNOSIS — G894 Chronic pain syndrome: Secondary | ICD-10-CM | POA: Diagnosis not present

## 2017-07-03 DIAGNOSIS — M545 Low back pain, unspecified: Secondary | ICD-10-CM

## 2017-07-03 DIAGNOSIS — F419 Anxiety disorder, unspecified: Secondary | ICD-10-CM | POA: Insufficient documentation

## 2017-07-03 DIAGNOSIS — F329 Major depressive disorder, single episode, unspecified: Secondary | ICD-10-CM | POA: Diagnosis not present

## 2017-07-03 DIAGNOSIS — K592 Neurogenic bowel, not elsewhere classified: Secondary | ICD-10-CM | POA: Diagnosis not present

## 2017-07-03 DIAGNOSIS — F17200 Nicotine dependence, unspecified, uncomplicated: Secondary | ICD-10-CM | POA: Insufficient documentation

## 2017-07-03 DIAGNOSIS — M62838 Other muscle spasm: Secondary | ICD-10-CM

## 2017-07-03 DIAGNOSIS — Z79899 Other long term (current) drug therapy: Secondary | ICD-10-CM | POA: Diagnosis not present

## 2017-07-03 DIAGNOSIS — Z5181 Encounter for therapeutic drug level monitoring: Secondary | ICD-10-CM | POA: Diagnosis not present

## 2017-07-03 DIAGNOSIS — N319 Neuromuscular dysfunction of bladder, unspecified: Secondary | ICD-10-CM | POA: Diagnosis not present

## 2017-07-03 DIAGNOSIS — G8929 Other chronic pain: Secondary | ICD-10-CM | POA: Diagnosis not present

## 2017-07-03 DIAGNOSIS — G8250 Quadriplegia, unspecified: Secondary | ICD-10-CM

## 2017-07-03 MED ORDER — OXYCODONE HCL ER 10 MG PO T12A: 10.0000 mg | EXTENDED_RELEASE_TABLET | Freq: Every day | ORAL | 0 refills | Status: DC

## 2017-07-03 MED ORDER — OXYCODONE HCL 5 MG PO TABS: 5.0000 mg | ORAL_TABLET | Freq: Three times a day (TID) | ORAL | 0 refills | Status: DC | PRN

## 2017-07-03 MED ORDER — OXYCODONE-ACETAMINOPHEN 7.5-325 MG PO TABS: 1.0000 | ORAL_TABLET | Freq: Four times a day (QID) | ORAL | 0 refills | Status: DC | PRN

## 2017-07-03 NOTE — Patient Instructions (Addendum)
Only take Tylenol twice a day as needed for Pain     Back Exercises If you have pain in your back, do these exercises 2-3 times each day or as told by your doctor. When the pain goes away, do the exercises once each day, but repeat the steps more times for each exercise (do more repetitions). If you do not have pain in your back, do these exercises once each day or as told by your doctor. Exercises Single Knee to Chest  Do these steps 3-5 times in a row for each leg: 1. Lie on your back on a firm bed or the floor with your legs stretched out. 2. Bring one knee to your chest. 3. Hold your knee to your chest by grabbing your knee or thigh. 4. Pull on your knee until you feel a gentle stretch in your lower back. 5. Keep doing the stretch for 10-30 seconds. 6. Slowly let go of your leg and straighten it.  Pelvic Tilt  Do these steps 5-10 times in a row: 1. Lie on your back on a firm bed or the floor with your legs stretched out. 2. Bend your knees so they point up to the ceiling. Your feet should be flat on the floor. 3. Tighten your lower belly (abdomen) muscles to press your lower back against the floor. This will make your tailbone point up to the ceiling instead of pointing down to your feet or the floor. 4. Stay in this position for 5-10 seconds while you gently tighten your muscles and breathe evenly.  Cat-Cow  Do these steps until your lower back bends more easily: 1. Get on your hands and knees on a firm surface. Keep your hands under your shoulders, and keep your knees under your hips. You may put padding under your knees. 2. Let your head hang down, and make your tailbone point down to the floor so your lower back is round like the back of a cat. 3. Stay in this position for 5 seconds. 4. Slowly lift your head and make your tailbone point up to the ceiling so your back hangs low (sags) like the back of a cow. 5. Stay in this position for 5 seconds.  Press-Ups  Do these  steps 5-10 times in a row: 1. Lie on your belly (face-down) on the floor. 2. Place your hands near your head, about shoulder-width apart. 3. While you keep your back relaxed and keep your hips on the floor, slowly straighten your arms to raise the top half of your body and lift your shoulders. Do not use your back muscles. To make yourself more comfortable, you may change where you place your hands. 4. Stay in this position for 5 seconds. 5. Slowly return to lying flat on the floor.  Bridges  Do these steps 10 times in a row: 1. Lie on your back on a firm surface. 2. Bend your knees so they point up to the ceiling. Your feet should be flat on the floor. 3. Tighten your butt muscles and lift your butt off of the floor until your waist is almost as high as your knees. If you do not feel the muscles working in your butt and the back of your thighs, slide your feet 1-2 inches farther away from your butt. 4. Stay in this position for 3-5 seconds. 5. Slowly lower your butt to the floor, and let your butt muscles relax.  If this exercise is too easy, try doing it with your  arms crossed over your chest. Belly Crunches  Do these steps 5-10 times in a row: 1. Lie on your back on a firm bed or the floor with your legs stretched out. 2. Bend your knees so they point up to the ceiling. Your feet should be flat on the floor. 3. Cross your arms over your chest. 4. Tip your chin a little bit toward your chest but do not bend your neck. 5. Tighten your belly muscles and slowly raise your chest just enough to lift your shoulder blades a tiny bit off of the floor. 6. Slowly lower your chest and your head to the floor.  Back Lifts Do these steps 5-10 times in a row: 1. Lie on your belly (face-down) with your arms at your sides, and rest your forehead on the floor. 2. Tighten the muscles in your legs and your butt. 3. Slowly lift your chest off of the floor while you keep your hips on the floor. Keep the  back of your head in line with the curve in your back. Look at the floor while you do this. 4. Stay in this position for 3-5 seconds. 5. Slowly lower your chest and your face to the floor.  Contact a doctor if:  Your back pain gets a lot worse when you do an exercise.  Your back pain does not lessen 2 hours after you exercise. If you have any of these problems, stop doing the exercises. Do not do them again unless your doctor says it is okay. Get help right away if:  You have sudden, very bad back pain. If this happens, stop doing the exercises. Do not do them again unless your doctor says it is okay. This information is not intended to replace advice given to you by your health care provider. Make sure you discuss any questions you have with your health care provider. Document Released: 08/10/2010 Document Revised: 12/14/2015 Document Reviewed: 09/01/2014 Elsevier Interactive Patient Education  [employer]2018 Else[employer] Inc.
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
## 2017-07-04 ENCOUNTER — Telehealth: Payer: Self-pay | Admitting: Registered Nurse

## 2017-07-04 ENCOUNTER — Telehealth: Payer: Self-pay

## 2017-07-04 NOTE — Telephone Encounter (Signed)
On 07/03/2017 the  [place] was [place] no conflict was seen [place] the [place] with multiple prescribers. [name]  has a signed narcotic contract with our office. If there were any discrepancies this would have been reported to his physician.

## 2017-07-04 NOTE — Telephone Encounter (Signed)
Started prior authorization today for oxycodone-acetaminophen 7.5-325mg  07-04-2017

## 2017-07-07 NOTE — Telephone Encounter (Signed)
Patients prior [employer] [place] has returned today as being approved for his oxycodone-acetaminophen 7.5-325mg 

## 2017-07-11 ENCOUNTER — Ambulatory Visit: Payer: Self-pay | Admitting: Registered Nurse

## 2017-07-16 ENCOUNTER — Telehealth: Payer: Self-pay

## 2017-07-16 NOTE — Telephone Encounter (Signed)
Patient called stating that his pharmacy needs another prior authorization on his oxycontin. Please advise.

## 2017-07-16 NOTE — Telephone Encounter (Signed)
Called patient to verify which pharmacy, it is his [facility] [phone]  Verified patients records, oxycodone 10mg  has been approved from 05-19-2017 to 07-14-2018 and the oxy-Apap. 7.5-325mg  approved from 07-04-2017 to 12-31-2017.  Called pharmacy and they stated that it is his oxycodone 10mg  that is in need of a prior approval.  Told pharmacist that we will call [place] medicaid tomorrow to find out what is going on.

## 2017-07-18 NOTE — Telephone Encounter (Signed)
Patient's wife needs a call back today about his oxycodone.  He is out.  Please call her on cell phone.

## 2017-07-18 NOTE — Telephone Encounter (Signed)
The situation has been resolved. [facility] was running the medication as oxycodone ER and Oxycontin is the preferred drug with [place] [employer]. Once we questioned [facility] about how they were processing claim, it went through.  Riley Lamunice talked to pharmacy and Mrs [name].

## 2017-07-20 ENCOUNTER — Other Ambulatory Visit: Payer: Self-pay | Admitting: Registered Nurse

## 2017-07-31 ENCOUNTER — Encounter: Payer: Medicaid Other | Admitting: Registered Nurse

## 2017-08-01 ENCOUNTER — Encounter: Payer: Self-pay | Admitting: Registered Nurse

## 2017-08-01 ENCOUNTER — Encounter: Payer: Medicaid Other | Attending: Physical Medicine & Rehabilitation | Admitting: Registered Nurse

## 2017-08-01 ENCOUNTER — Telehealth: Payer: Self-pay | Admitting: *Deleted

## 2017-08-01 VITALS — BP 137/84 | HR 76

## 2017-08-01 DIAGNOSIS — G8250 Quadriplegia, unspecified: Secondary | ICD-10-CM

## 2017-08-01 DIAGNOSIS — K592 Neurogenic bowel, not elsewhere classified: Secondary | ICD-10-CM | POA: Diagnosis not present

## 2017-08-01 DIAGNOSIS — S14104S Unspecified injury at C4 level of cervical spinal cord, sequela: Secondary | ICD-10-CM | POA: Diagnosis present

## 2017-08-01 DIAGNOSIS — Z7901 Long term (current) use of anticoagulants: Secondary | ICD-10-CM | POA: Diagnosis not present

## 2017-08-01 DIAGNOSIS — M545 Low back pain, unspecified: Secondary | ICD-10-CM

## 2017-08-01 DIAGNOSIS — F17200 Nicotine dependence, unspecified, uncomplicated: Secondary | ICD-10-CM | POA: Insufficient documentation

## 2017-08-01 DIAGNOSIS — Z5181 Encounter for therapeutic drug level monitoring: Secondary | ICD-10-CM | POA: Diagnosis not present

## 2017-08-01 DIAGNOSIS — M792 Neuralgia and neuritis, unspecified: Secondary | ICD-10-CM | POA: Diagnosis not present

## 2017-08-01 DIAGNOSIS — R252 Cramp and spasm: Secondary | ICD-10-CM | POA: Diagnosis not present

## 2017-08-01 DIAGNOSIS — F329 Major depressive disorder, single episode, unspecified: Secondary | ICD-10-CM | POA: Diagnosis not present

## 2017-08-01 DIAGNOSIS — Z79899 Other long term (current) drug therapy: Secondary | ICD-10-CM | POA: Diagnosis not present

## 2017-08-01 DIAGNOSIS — G894 Chronic pain syndrome: Secondary | ICD-10-CM | POA: Diagnosis not present

## 2017-08-01 DIAGNOSIS — F419 Anxiety disorder, unspecified: Secondary | ICD-10-CM | POA: Insufficient documentation

## 2017-08-01 DIAGNOSIS — S14101S Unspecified injury at C1 level of cervical spinal cord, sequela: Secondary | ICD-10-CM | POA: Diagnosis not present

## 2017-08-01 DIAGNOSIS — Z981 Arthrodesis status: Secondary | ICD-10-CM | POA: Insufficient documentation

## 2017-08-01 DIAGNOSIS — M62838 Other muscle spasm: Secondary | ICD-10-CM | POA: Diagnosis not present

## 2017-08-01 DIAGNOSIS — N319 Neuromuscular dysfunction of bladder, unspecified: Secondary | ICD-10-CM | POA: Insufficient documentation

## 2017-08-01 MED ORDER — OXYCODONE-ACETAMINOPHEN 7.5-325 MG PO TABS: 1.0000 | ORAL_TABLET | Freq: Four times a day (QID) | ORAL | 0 refills | Status: DC | PRN

## 2017-08-01 MED ORDER — CLONAZEPAM 0.5 MG PO TABS: 0.5000 mg | ORAL_TABLET | Freq: Three times a day (TID) | ORAL | 2 refills | Status: DC

## 2017-08-01 MED ORDER — OXYCODONE HCL ER 10 MG PO T12A: 10.0000 mg | EXTENDED_RELEASE_TABLET | Freq: Every day | ORAL | 0 refills | Status: DC

## 2017-08-01 MED ORDER — DICLOFENAC SODIUM 75 MG PO TBEC: 75.0000 mg | DELAYED_RELEASE_TABLET | Freq: Two times a day (BID) | ORAL | 3 refills | Status: DC
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
## 2017-08-01 NOTE — Telephone Encounter (Signed)
Prior auth submitted to [facility] for clonazepam.  This medication in its generic form is a preferred medication on medicaids current list

## 2017-08-05 LAB — DRUG TOX MONITOR 1 W/CONF, ORAL FLD
Alprazolam: NEGATIVE ng/mL (ref <0.50)
Amphetamines: NEGATIVE ng/mL (ref <10)
BUPRENORPHINE: NEGATIVE ng/mL (ref <0.10)
Barbiturates: NEGATIVE ng/mL (ref <10)
Benzodiazepines: POSITIVE ng/mL — AB (ref <0.50)
CLONAZEPAM: 3.17 ng/mL — AB (ref <0.50)
COCAINE: NEGATIVE ng/mL (ref <5.0)
CODEINE: NEGATIVE ng/mL (ref <2.5)
Chlordiazepoxide: NEGATIVE ng/mL (ref <0.50)
DIHYDROCODEINE: NEGATIVE ng/mL (ref <2.5)
Diazepam: NEGATIVE ng/mL (ref <0.50)
FENTANYL: NEGATIVE ng/mL (ref <0.10)
FLURAZEPAM: NEGATIVE ng/mL (ref <0.50)
Flunitrazepam: NEGATIVE ng/mL (ref <0.50)
HEROIN METABOLITE: NEGATIVE ng/mL (ref <1.0)
HYDROCODONE: NEGATIVE ng/mL (ref <2.5)
Hydromorphone: NEGATIVE ng/mL (ref <2.5)
Lorazepam: NEGATIVE ng/mL (ref <0.50)
MARIJUANA: NEGATIVE ng/mL (ref <2.5)
MDMA: NEGATIVE ng/mL (ref <10)
MORPHINE: NEGATIVE ng/mL (ref <2.5)
Meprobamate: NEGATIVE ng/mL (ref <2.5)
Methadone: NEGATIVE ng/mL (ref <5.0)
Midazolam: NEGATIVE ng/mL (ref <0.50)
NICOTINE METABOLITE: POSITIVE ng/mL — AB (ref <5.0)
Nordiazepam: NEGATIVE ng/mL (ref <0.50)
Norhydrocodone: NEGATIVE ng/mL (ref <2.5)
Noroxycodone: 24.6 ng/mL — ABNORMAL HIGH (ref <2.5)
OPIATES: POSITIVE ng/mL — AB (ref <2.5)
OXYMORPHONE: NEGATIVE ng/mL (ref <2.5)
Oxazepam: NEGATIVE ng/mL (ref <0.50)
PHENCYCLIDINE: NEGATIVE ng/mL (ref <10)
TEMAZEPAM: NEGATIVE ng/mL (ref <0.50)
TRAMADOL: NEGATIVE ng/mL (ref <5.0)
TRIAZOLAM: NEGATIVE ng/mL (ref <0.50)
Tapentadol: NEGATIVE ng/mL (ref <5.0)
Zolpidem: NEGATIVE ng/mL (ref <5.0)

## 2017-08-05 LAB — DRUG TOX ALC METAB W/CON, ORAL FLD: Alcohol Metabolite: NEGATIVE ng/mL (ref <25)

## 2017-08-07 ENCOUNTER — Telehealth: Payer: Self-pay | Admitting: *Deleted

## 2017-08-07 NOTE — Telephone Encounter (Signed)
Oral swab drug screen was consistent for prescribed medications.  ?

## 2017-08-08 ENCOUNTER — Ambulatory Visit (HOSPITAL_BASED_OUTPATIENT_CLINIC_OR_DEPARTMENT_OTHER)
Admission: RE | Admit: 2017-08-08 | Discharge: 2017-08-08 | Disposition: A | Discharge status: Home / Self Care | Payer: Medicaid Other | Source: Ambulatory Visit | Attending: Registered Nurse | Admitting: Registered Nurse

## 2017-08-08 DIAGNOSIS — M545 Low back pain: Secondary | ICD-10-CM | POA: Insufficient documentation

## 2017-08-08 DIAGNOSIS — G8929 Other chronic pain: Secondary | ICD-10-CM | POA: Insufficient documentation

## 2017-08-17 ENCOUNTER — Other Ambulatory Visit: Payer: Self-pay | Admitting: Physical Medicine & Rehabilitation

## 2017-08-17 DIAGNOSIS — S14101S Unspecified injury at C1 level of cervical spinal cord, sequela: Secondary | ICD-10-CM

## 2017-08-17 DIAGNOSIS — M792 Neuralgia and neuritis, unspecified: Secondary | ICD-10-CM

## 2017-08-17 DIAGNOSIS — G8250 Quadriplegia, unspecified: Secondary | ICD-10-CM

## 2017-08-22 ENCOUNTER — Other Ambulatory Visit: Payer: Self-pay | Admitting: Registered Nurse

## 2017-08-22 DIAGNOSIS — S14101S Unspecified injury at C1 level of cervical spinal cord, sequela: Secondary | ICD-10-CM

## 2017-08-22 DIAGNOSIS — G8250 Quadriplegia, unspecified: Secondary | ICD-10-CM

## 2017-08-22 DIAGNOSIS — N319 Neuromuscular dysfunction of bladder, unspecified: Secondary | ICD-10-CM

## 2017-08-29 ENCOUNTER — Encounter: Payer: Self-pay | Admitting: Registered Nurse

## 2017-08-29 ENCOUNTER — Other Ambulatory Visit: Payer: Self-pay

## 2017-08-29 ENCOUNTER — Encounter: Payer: Medicaid Other | Attending: Physical Medicine & Rehabilitation | Admitting: Registered Nurse

## 2017-08-29 VITALS — BP 135/81 | HR 73

## 2017-08-29 DIAGNOSIS — F17200 Nicotine dependence, unspecified, uncomplicated: Secondary | ICD-10-CM | POA: Insufficient documentation

## 2017-08-29 DIAGNOSIS — R252 Cramp and spasm: Secondary | ICD-10-CM | POA: Insufficient documentation

## 2017-08-29 DIAGNOSIS — Z7901 Long term (current) use of anticoagulants: Secondary | ICD-10-CM | POA: Insufficient documentation

## 2017-08-29 DIAGNOSIS — M25562 Pain in left knee: Secondary | ICD-10-CM

## 2017-08-29 DIAGNOSIS — Z79899 Other long term (current) drug therapy: Secondary | ICD-10-CM

## 2017-08-29 DIAGNOSIS — F329 Major depressive disorder, single episode, unspecified: Secondary | ICD-10-CM | POA: Diagnosis not present

## 2017-08-29 DIAGNOSIS — M25561 Pain in right knee: Secondary | ICD-10-CM | POA: Diagnosis not present

## 2017-08-29 DIAGNOSIS — N319 Neuromuscular dysfunction of bladder, unspecified: Secondary | ICD-10-CM | POA: Diagnosis not present

## 2017-08-29 DIAGNOSIS — K592 Neurogenic bowel, not elsewhere classified: Secondary | ICD-10-CM | POA: Diagnosis not present

## 2017-08-29 DIAGNOSIS — G894 Chronic pain syndrome: Secondary | ICD-10-CM

## 2017-08-29 DIAGNOSIS — S14101S Unspecified injury at C1 level of cervical spinal cord, sequela: Secondary | ICD-10-CM

## 2017-08-29 DIAGNOSIS — G8250 Quadriplegia, unspecified: Secondary | ICD-10-CM

## 2017-08-29 DIAGNOSIS — M62838 Other muscle spasm: Secondary | ICD-10-CM

## 2017-08-29 DIAGNOSIS — F419 Anxiety disorder, unspecified: Secondary | ICD-10-CM | POA: Insufficient documentation

## 2017-08-29 DIAGNOSIS — S14104S Unspecified injury at C4 level of cervical spinal cord, sequela: Secondary | ICD-10-CM | POA: Diagnosis not present

## 2017-08-29 DIAGNOSIS — Z5181 Encounter for therapeutic drug level monitoring: Secondary | ICD-10-CM

## 2017-08-29 DIAGNOSIS — Z981 Arthrodesis status: Secondary | ICD-10-CM | POA: Insufficient documentation

## 2017-08-29 MED ORDER — OXYCODONE-ACETAMINOPHEN 7.5-325 MG PO TABS: 1.0000 | ORAL_TABLET | Freq: Four times a day (QID) | ORAL | 0 refills | Status: DC | PRN

## 2017-08-29 MED ORDER — OXYCODONE HCL ER 10 MG PO T12A: 10.0000 mg | EXTENDED_RELEASE_TABLET | Freq: Every day | ORAL | 0 refills | Status: DC

## 2017-08-29 MED ORDER — POLYETHYLENE GLYCOL 3350 17 G PO PACK: 17.0000 g | PACK | Freq: Every day | ORAL | 3 refills | Status: DC

## 2017-08-29 MED ORDER — GABAPENTIN 300 MG PO CAPS
ORAL_CAPSULE | ORAL | 2 refills | Status: DC
Start: 2017-08-29 — End: 2017-11-21
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
## 2017-09-11 ENCOUNTER — Other Ambulatory Visit: Payer: Self-pay | Admitting: Physical Medicine & Rehabilitation

## 2017-09-11 DIAGNOSIS — M792 Neuralgia and neuritis, unspecified: Secondary | ICD-10-CM

## 2017-09-11 DIAGNOSIS — S14101S Unspecified injury at C1 level of cervical spinal cord, sequela: Secondary | ICD-10-CM

## 2017-09-11 DIAGNOSIS — G8250 Quadriplegia, unspecified: Secondary | ICD-10-CM

## 2017-09-22 ENCOUNTER — Telehealth: Payer: Self-pay

## 2017-09-22 MED ORDER — OXYCODONE-ACETAMINOPHEN 7.5-325 MG PO TABS: 1.0000 | ORAL_TABLET | Freq: Four times a day (QID) | ORAL | 0 refills | Status: DC | PRN

## 2017-09-22 NOTE — Telephone Encounter (Signed)
Patient called requesting a change in medication on his next visit and a pre-prior auth for them before his next appointment as well.   He would like to decrease his Long Term acting opioid while increasing to the next level of his short term acting opioid medications.  In addition to medication changes he is also having an issue with his current medication of clonazepam, stated that the pharmacy needs a prior authorization for it currently as well.

## 2017-09-22 NOTE — Telephone Encounter (Signed)
Return  Edward [name] call, I placed a call to [facility] he doesn't need a PA regarding his klonopin. Last time prescription was picked up was on 08/28/2017, he can pick up the klonopin on 09/27/2017, PMP was reviewed.. Edward [name] would like to discontinue the Oxycontin on his next visit. He realizes since he has Medicaid and PA has to be performed with medication changes, he's calling early as instructed. His Percocet will be changed to Q6 hours as needed. He verbalizes understanding.  PA will be performed in the morning by CMA he verbalizes understanding.

## 2017-09-23 ENCOUNTER — Telehealth: Payer: Self-pay | Admitting: *Deleted

## 2017-09-23 NOTE — Telephone Encounter (Signed)
[facility] oxycodone-acetaminophen 7.5-325 mg.

## 2017-09-25 ENCOUNTER — Telehealth: Payer: Self-pay | Admitting: *Deleted

## 2017-09-25 NOTE — Telephone Encounter (Signed)
Prior authorization for oxycodone-acetaminophen 7.5-325 mg #120 approved for 6 months

## 2017-09-26 ENCOUNTER — Encounter: Payer: Medicaid Other | Attending: Physical Medicine & Rehabilitation | Admitting: Registered Nurse

## 2017-09-26 ENCOUNTER — Encounter: Payer: Self-pay | Admitting: Registered Nurse

## 2017-09-26 VITALS — BP 146/88 | HR 81 | Resp 14

## 2017-09-26 DIAGNOSIS — M792 Neuralgia and neuritis, unspecified: Secondary | ICD-10-CM | POA: Diagnosis not present

## 2017-09-26 DIAGNOSIS — F17200 Nicotine dependence, unspecified, uncomplicated: Secondary | ICD-10-CM | POA: Diagnosis not present

## 2017-09-26 DIAGNOSIS — Z981 Arthrodesis status: Secondary | ICD-10-CM | POA: Insufficient documentation

## 2017-09-26 DIAGNOSIS — S14104S Unspecified injury at C4 level of cervical spinal cord, sequela: Secondary | ICD-10-CM | POA: Diagnosis present

## 2017-09-26 DIAGNOSIS — M25561 Pain in right knee: Secondary | ICD-10-CM | POA: Diagnosis not present

## 2017-09-26 DIAGNOSIS — F419 Anxiety disorder, unspecified: Secondary | ICD-10-CM | POA: Insufficient documentation

## 2017-09-26 DIAGNOSIS — R252 Cramp and spasm: Secondary | ICD-10-CM | POA: Insufficient documentation

## 2017-09-26 DIAGNOSIS — Z7901 Long term (current) use of anticoagulants: Secondary | ICD-10-CM | POA: Diagnosis not present

## 2017-09-26 DIAGNOSIS — K592 Neurogenic bowel, not elsewhere classified: Secondary | ICD-10-CM | POA: Insufficient documentation

## 2017-09-26 DIAGNOSIS — S14101S Unspecified injury at C1 level of cervical spinal cord, sequela: Secondary | ICD-10-CM

## 2017-09-26 DIAGNOSIS — Z5181 Encounter for therapeutic drug level monitoring: Secondary | ICD-10-CM | POA: Diagnosis not present

## 2017-09-26 DIAGNOSIS — N319 Neuromuscular dysfunction of bladder, unspecified: Secondary | ICD-10-CM | POA: Diagnosis not present

## 2017-09-26 DIAGNOSIS — M62838 Other muscle spasm: Secondary | ICD-10-CM | POA: Diagnosis not present

## 2017-09-26 DIAGNOSIS — F329 Major depressive disorder, single episode, unspecified: Secondary | ICD-10-CM | POA: Insufficient documentation

## 2017-09-26 DIAGNOSIS — G894 Chronic pain syndrome: Secondary | ICD-10-CM

## 2017-09-26 DIAGNOSIS — M25562 Pain in left knee: Secondary | ICD-10-CM | POA: Diagnosis not present

## 2017-09-26 DIAGNOSIS — G8250 Quadriplegia, unspecified: Secondary | ICD-10-CM

## 2017-09-26 DIAGNOSIS — Z79899 Other long term (current) drug therapy: Secondary | ICD-10-CM

## 2017-09-26 MED ORDER — TIZANIDINE HCL 4 MG PO TABS: ORAL_TABLET | ORAL | 2 refills | Status: DC

## 2017-09-26 MED ORDER — OXYCODONE-ACETAMINOPHEN 7.5-325 MG PO TABS: 1.0000 | ORAL_TABLET | Freq: Four times a day (QID) | ORAL | 0 refills | Status: DC | PRN
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
## 2017-09-30 ENCOUNTER — Other Ambulatory Visit: Payer: Self-pay | Admitting: Physical Medicine & Rehabilitation

## 2017-10-01 ENCOUNTER — Other Ambulatory Visit: Payer: Self-pay | Admitting: Physical Medicine & Rehabilitation

## 2017-10-06 ENCOUNTER — Telehealth: Payer: Self-pay | Admitting: *Deleted

## 2017-10-06 NOTE — Telephone Encounter (Signed)
Mr Edward [name] called about his pantoprazole. Said he needed authorization and pharmacy had been faxing to us.

## 2017-10-07 NOTE — Telephone Encounter (Signed)
Contacted pharmacy and they said that the patient already picked up his medication.  Pharmacist thinks he was confused between prior authorization and refill authorization

## 2017-10-21 ENCOUNTER — Other Ambulatory Visit: Payer: Self-pay | Admitting: Physical Medicine & Rehabilitation

## 2017-10-21 DIAGNOSIS — N319 Neuromuscular dysfunction of bladder, unspecified: Secondary | ICD-10-CM

## 2017-10-21 DIAGNOSIS — S14101S Unspecified injury at C1 level of cervical spinal cord, sequela: Secondary | ICD-10-CM

## 2017-10-21 DIAGNOSIS — G8250 Quadriplegia, unspecified: Secondary | ICD-10-CM

## 2017-10-24 ENCOUNTER — Other Ambulatory Visit: Payer: Self-pay

## 2017-10-24 ENCOUNTER — Encounter: Payer: Self-pay | Admitting: Registered Nurse

## 2017-10-24 ENCOUNTER — Encounter: Payer: Medicaid Other | Attending: Physical Medicine & Rehabilitation | Admitting: Registered Nurse

## 2017-10-24 VITALS — BP 135/87 | HR 72 | Ht 72.0 in | Wt 178.0 lb

## 2017-10-24 DIAGNOSIS — Z7901 Long term (current) use of anticoagulants: Secondary | ICD-10-CM | POA: Insufficient documentation

## 2017-10-24 DIAGNOSIS — Z79899 Other long term (current) drug therapy: Secondary | ICD-10-CM

## 2017-10-24 DIAGNOSIS — R252 Cramp and spasm: Secondary | ICD-10-CM | POA: Diagnosis not present

## 2017-10-24 DIAGNOSIS — Z5181 Encounter for therapeutic drug level monitoring: Secondary | ICD-10-CM

## 2017-10-24 DIAGNOSIS — M62838 Other muscle spasm: Secondary | ICD-10-CM | POA: Diagnosis not present

## 2017-10-24 DIAGNOSIS — N319 Neuromuscular dysfunction of bladder, unspecified: Secondary | ICD-10-CM | POA: Insufficient documentation

## 2017-10-24 DIAGNOSIS — M7062 Trochanteric bursitis, left hip: Secondary | ICD-10-CM

## 2017-10-24 DIAGNOSIS — S14104S Unspecified injury at C4 level of cervical spinal cord, sequela: Secondary | ICD-10-CM | POA: Insufficient documentation

## 2017-10-24 DIAGNOSIS — F419 Anxiety disorder, unspecified: Secondary | ICD-10-CM | POA: Insufficient documentation

## 2017-10-24 DIAGNOSIS — F329 Major depressive disorder, single episode, unspecified: Secondary | ICD-10-CM | POA: Insufficient documentation

## 2017-10-24 DIAGNOSIS — S14101S Unspecified injury at C1 level of cervical spinal cord, sequela: Secondary | ICD-10-CM | POA: Diagnosis not present

## 2017-10-24 DIAGNOSIS — K592 Neurogenic bowel, not elsewhere classified: Secondary | ICD-10-CM | POA: Insufficient documentation

## 2017-10-24 DIAGNOSIS — G894 Chronic pain syndrome: Secondary | ICD-10-CM | POA: Diagnosis not present

## 2017-10-24 DIAGNOSIS — Z981 Arthrodesis status: Secondary | ICD-10-CM | POA: Diagnosis not present

## 2017-10-24 DIAGNOSIS — G8250 Quadriplegia, unspecified: Secondary | ICD-10-CM

## 2017-10-24 DIAGNOSIS — M25562 Pain in left knee: Secondary | ICD-10-CM

## 2017-10-24 DIAGNOSIS — F17200 Nicotine dependence, unspecified, uncomplicated: Secondary | ICD-10-CM | POA: Insufficient documentation

## 2017-10-24 DIAGNOSIS — M25561 Pain in right knee: Secondary | ICD-10-CM

## 2017-10-24 DIAGNOSIS — M7061 Trochanteric bursitis, right hip: Secondary | ICD-10-CM
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
## 2017-10-27 ENCOUNTER — Telehealth: Payer: Self-pay | Admitting: Physical Medicine & Rehabilitation

## 2017-10-27 MED ORDER — OXYCODONE-ACETAMINOPHEN 7.5-325 MG PO TABS: 1.0000 | ORAL_TABLET | Freq: Four times a day (QID) | ORAL | 0 refills | Status: DC | PRN

## 2017-10-27 NOTE — Telephone Encounter (Signed)
Pt phoned and stated that the pharmacy did not receive the oxycodone. The last OV with you 04/05. Pt states he is out of meds. Please advise.

## 2017-10-27 NOTE — Telephone Encounter (Signed)
Placed a call to [name], he reports [facility] never received his Oxycodone prescription. [facility]  O[facility]one prescription. Oxycodone e-scribe, [name] is aware of the above. He verbalizes understanding.

## 2017-10-31 ENCOUNTER — Other Ambulatory Visit: Payer: Self-pay | Admitting: Physical Medicine & Rehabilitation

## 2017-11-03 ENCOUNTER — Other Ambulatory Visit: Payer: Self-pay | Admitting: Physical Medicine & Rehabilitation

## 2017-11-03 DIAGNOSIS — G8250 Quadriplegia, unspecified: Secondary | ICD-10-CM

## 2017-11-03 DIAGNOSIS — N319 Neuromuscular dysfunction of bladder, unspecified: Secondary | ICD-10-CM

## 2017-11-03 DIAGNOSIS — G904 Autonomic dysreflexia: Secondary | ICD-10-CM

## 2017-11-03 DIAGNOSIS — S14101S Unspecified injury at C1 level of cervical spinal cord, sequela: Secondary | ICD-10-CM

## 2017-11-21 ENCOUNTER — Encounter: Payer: Medicaid Other | Attending: Physical Medicine & Rehabilitation | Admitting: Registered Nurse

## 2017-11-21 ENCOUNTER — Encounter: Payer: Medicaid Other | Admitting: Registered Nurse

## 2017-11-21 ENCOUNTER — Encounter: Payer: Self-pay | Admitting: Registered Nurse

## 2017-11-21 VITALS — BP 130/86 | HR 77 | Resp 14 | Ht 72.0 in | Wt 172.0 lb

## 2017-11-21 DIAGNOSIS — F17200 Nicotine dependence, unspecified, uncomplicated: Secondary | ICD-10-CM | POA: Diagnosis not present

## 2017-11-21 DIAGNOSIS — M792 Neuralgia and neuritis, unspecified: Secondary | ICD-10-CM | POA: Diagnosis not present

## 2017-11-21 DIAGNOSIS — Z981 Arthrodesis status: Secondary | ICD-10-CM | POA: Insufficient documentation

## 2017-11-21 DIAGNOSIS — M62838 Other muscle spasm: Secondary | ICD-10-CM | POA: Diagnosis not present

## 2017-11-21 DIAGNOSIS — S14101S Unspecified injury at C1 level of cervical spinal cord, sequela: Secondary | ICD-10-CM | POA: Diagnosis not present

## 2017-11-21 DIAGNOSIS — K592 Neurogenic bowel, not elsewhere classified: Secondary | ICD-10-CM | POA: Diagnosis not present

## 2017-11-21 DIAGNOSIS — M25562 Pain in left knee: Secondary | ICD-10-CM

## 2017-11-21 DIAGNOSIS — Z5181 Encounter for therapeutic drug level monitoring: Secondary | ICD-10-CM

## 2017-11-21 DIAGNOSIS — Z79899 Other long term (current) drug therapy: Secondary | ICD-10-CM

## 2017-11-21 DIAGNOSIS — Z7901 Long term (current) use of anticoagulants: Secondary | ICD-10-CM | POA: Diagnosis not present

## 2017-11-21 DIAGNOSIS — S14104S Unspecified injury at C4 level of cervical spinal cord, sequela: Secondary | ICD-10-CM | POA: Diagnosis present

## 2017-11-21 DIAGNOSIS — R252 Cramp and spasm: Secondary | ICD-10-CM | POA: Insufficient documentation

## 2017-11-21 DIAGNOSIS — F329 Major depressive disorder, single episode, unspecified: Secondary | ICD-10-CM | POA: Diagnosis not present

## 2017-11-21 DIAGNOSIS — M25561 Pain in right knee: Secondary | ICD-10-CM

## 2017-11-21 DIAGNOSIS — G894 Chronic pain syndrome: Secondary | ICD-10-CM | POA: Diagnosis not present

## 2017-11-21 DIAGNOSIS — G8250 Quadriplegia, unspecified: Secondary | ICD-10-CM

## 2017-11-21 DIAGNOSIS — F419 Anxiety disorder, unspecified: Secondary | ICD-10-CM | POA: Insufficient documentation

## 2017-11-21 DIAGNOSIS — N319 Neuromuscular dysfunction of bladder, unspecified: Secondary | ICD-10-CM | POA: Diagnosis not present

## 2017-11-21 MED ORDER — DICLOFENAC SODIUM 75 MG PO TBEC: DELAYED_RELEASE_TABLET | ORAL | 2 refills | Status: DC

## 2017-11-21 MED ORDER — CLONAZEPAM 0.5 MG PO TABS: 0.5000 mg | ORAL_TABLET | Freq: Three times a day (TID) | ORAL | 2 refills | Status: DC

## 2017-11-21 MED ORDER — TIZANIDINE HCL 4 MG PO TABS: ORAL_TABLET | ORAL | 2 refills | Status: DC

## 2017-11-21 MED ORDER — OXYCODONE-ACETAMINOPHEN 7.5-325 MG PO TABS: 1.0000 | ORAL_TABLET | Freq: Four times a day (QID) | ORAL | 0 refills | Status: DC | PRN
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
## 2017-11-28 ENCOUNTER — Other Ambulatory Visit: Payer: Self-pay | Admitting: Physical Medicine & Rehabilitation

## 2017-12-22 ENCOUNTER — Encounter: Payer: Self-pay | Admitting: Physical Medicine & Rehabilitation

## 2017-12-22 ENCOUNTER — Encounter: Payer: Medicaid Other | Attending: Physical Medicine & Rehabilitation | Admitting: Physical Medicine & Rehabilitation

## 2017-12-22 VITALS — BP 147/86 | HR 71 | Ht 72.0 in | Wt 173.0 lb

## 2017-12-22 DIAGNOSIS — Z5181 Encounter for therapeutic drug level monitoring: Secondary | ICD-10-CM

## 2017-12-22 DIAGNOSIS — S14101S Unspecified injury at C1 level of cervical spinal cord, sequela: Secondary | ICD-10-CM | POA: Diagnosis not present

## 2017-12-22 DIAGNOSIS — M62838 Other muscle spasm: Secondary | ICD-10-CM

## 2017-12-22 DIAGNOSIS — Z981 Arthrodesis status: Secondary | ICD-10-CM | POA: Diagnosis not present

## 2017-12-22 DIAGNOSIS — K592 Neurogenic bowel, not elsewhere classified: Secondary | ICD-10-CM | POA: Diagnosis not present

## 2017-12-22 DIAGNOSIS — Z79899 Other long term (current) drug therapy: Secondary | ICD-10-CM | POA: Diagnosis not present

## 2017-12-22 DIAGNOSIS — S14104S Unspecified injury at C4 level of cervical spinal cord, sequela: Secondary | ICD-10-CM | POA: Insufficient documentation

## 2017-12-22 DIAGNOSIS — G8250 Quadriplegia, unspecified: Secondary | ICD-10-CM

## 2017-12-22 DIAGNOSIS — R252 Cramp and spasm: Secondary | ICD-10-CM | POA: Insufficient documentation

## 2017-12-22 DIAGNOSIS — F17200 Nicotine dependence, unspecified, uncomplicated: Secondary | ICD-10-CM | POA: Diagnosis not present

## 2017-12-22 DIAGNOSIS — Z7901 Long term (current) use of anticoagulants: Secondary | ICD-10-CM | POA: Insufficient documentation

## 2017-12-22 DIAGNOSIS — F329 Major depressive disorder, single episode, unspecified: Secondary | ICD-10-CM | POA: Diagnosis not present

## 2017-12-22 DIAGNOSIS — F419 Anxiety disorder, unspecified: Secondary | ICD-10-CM | POA: Diagnosis not present

## 2017-12-22 DIAGNOSIS — N319 Neuromuscular dysfunction of bladder, unspecified: Secondary | ICD-10-CM | POA: Insufficient documentation

## 2017-12-22 MED ORDER — OXYCODONE-ACETAMINOPHEN 7.5-325 MG PO TABS: 1.0000 | ORAL_TABLET | Freq: Four times a day (QID) | ORAL | 0 refills | Status: DC | PRN
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
## 2017-12-22 NOTE — Patient Instructions (Signed)
1. WORK ON WALKING MECHANICS.    2. I WANT YOU TO START REDUCING YOUR PERCOCET. TRY SKIPPING A PILL WHEN YOUR PAIN IS WELL CONTROLLED. I WOULD LIKE TO SEE YOU COME BACK NEXT MONTH WITH 10-15 PILLS.      PLEASE FEEL FREE TO CALL OUR OFFICE WITH ANY PROBLEMS OR QUESTIONS [phone])

## 2017-12-25 LAB — DRUG TOX MONITOR 1 W/CONF, ORAL FLD
Alprazolam: NEGATIVE ng/mL (ref <0.50)
Amphetamines: NEGATIVE ng/mL (ref <10)
BUPRENORPHINE: NEGATIVE ng/mL (ref <0.10)
Barbiturates: NEGATIVE ng/mL (ref <10)
Benzodiazepines: POSITIVE ng/mL — AB (ref <0.50)
COCAINE: NEGATIVE ng/mL (ref <5.0)
Chlordiazepoxide: NEGATIVE ng/mL (ref <0.50)
Clonazepam: 2.65 ng/mL — ABNORMAL HIGH (ref <0.50)
Codeine: NEGATIVE ng/mL (ref <2.5)
DIAZEPAM: NEGATIVE ng/mL (ref <0.50)
Dihydrocodeine: NEGATIVE ng/mL (ref <2.5)
FLUNITRAZEPAM: NEGATIVE ng/mL (ref <0.50)
FLURAZEPAM: NEGATIVE ng/mL (ref <0.50)
Fentanyl: NEGATIVE ng/mL (ref <0.10)
Heroin Metabolite: NEGATIVE ng/mL (ref <1.0)
Hydrocodone: NEGATIVE ng/mL (ref <2.5)
Hydromorphone: NEGATIVE ng/mL (ref <2.5)
Lorazepam: NEGATIVE ng/mL (ref <0.50)
MARIJUANA: NEGATIVE ng/mL (ref <2.5)
MDMA: NEGATIVE ng/mL (ref <10)
MEPROBAMATE: NEGATIVE ng/mL (ref <2.5)
METHADONE: NEGATIVE ng/mL (ref <5.0)
Midazolam: NEGATIVE ng/mL (ref <0.50)
Morphine: NEGATIVE ng/mL (ref <2.5)
NORDIAZEPAM: NEGATIVE ng/mL (ref <0.50)
NORHYDROCODONE: NEGATIVE ng/mL (ref <2.5)
NOROXYCODONE: 13.7 ng/mL — AB (ref <2.5)
Nicotine Metabolite: POSITIVE ng/mL — AB (ref <5.0)
OPIATES: POSITIVE ng/mL — AB (ref <2.5)
OXYCODONE: 143.9 ng/mL — AB (ref <2.5)
OXYMORPHONE: NEGATIVE ng/mL (ref <2.5)
Oxazepam: NEGATIVE ng/mL (ref <0.50)
PHENCYCLIDINE: NEGATIVE ng/mL (ref <10)
Tapentadol: NEGATIVE ng/mL (ref <5.0)
Temazepam: NEGATIVE ng/mL (ref <0.50)
Tramadol: NEGATIVE ng/mL (ref <5.0)
Triazolam: NEGATIVE ng/mL (ref <0.50)
Zolpidem: NEGATIVE ng/mL (ref <5.0)

## 2017-12-25 LAB — DRUG TOX ALC METAB W/CON, ORAL FLD: ALCOHOL METABOLITE: NEGATIVE ng/mL (ref <25)

## 2017-12-26 ENCOUNTER — Other Ambulatory Visit: Payer: Self-pay | Admitting: Physical Medicine & Rehabilitation

## 2018-01-01 ENCOUNTER — Telehealth: Payer: Self-pay | Admitting: *Deleted

## 2018-01-01 NOTE — Telephone Encounter (Signed)
Oral swab drug screen was consistent for prescribed medications.  ?

## 2018-01-11 ENCOUNTER — Other Ambulatory Visit: Payer: Self-pay | Admitting: Physical Medicine & Rehabilitation

## 2018-01-11 DIAGNOSIS — N319 Neuromuscular dysfunction of bladder, unspecified: Secondary | ICD-10-CM

## 2018-01-11 DIAGNOSIS — S14101S Unspecified injury at C1 level of cervical spinal cord, sequela: Secondary | ICD-10-CM

## 2018-01-11 DIAGNOSIS — G8250 Quadriplegia, unspecified: Secondary | ICD-10-CM

## 2018-01-16 ENCOUNTER — Encounter: Payer: Self-pay | Admitting: Registered Nurse

## 2018-01-16 ENCOUNTER — Encounter: Payer: Medicaid Other | Admitting: Registered Nurse

## 2018-01-16 VITALS — BP 106/74 | HR 90 | Resp 14 | Ht 72.0 in | Wt 173.0 lb

## 2018-01-16 DIAGNOSIS — M25562 Pain in left knee: Secondary | ICD-10-CM

## 2018-01-16 DIAGNOSIS — M25561 Pain in right knee: Secondary | ICD-10-CM | POA: Diagnosis not present

## 2018-01-16 DIAGNOSIS — M5412 Radiculopathy, cervical region: Secondary | ICD-10-CM | POA: Diagnosis not present

## 2018-01-16 DIAGNOSIS — S14101S Unspecified injury at C1 level of cervical spinal cord, sequela: Secondary | ICD-10-CM

## 2018-01-16 DIAGNOSIS — G8250 Quadriplegia, unspecified: Secondary | ICD-10-CM | POA: Diagnosis not present

## 2018-01-16 DIAGNOSIS — F3289 Other specified depressive episodes: Secondary | ICD-10-CM | POA: Diagnosis not present

## 2018-01-16 DIAGNOSIS — Z79899 Other long term (current) drug therapy: Secondary | ICD-10-CM

## 2018-01-16 DIAGNOSIS — N319 Neuromuscular dysfunction of bladder, unspecified: Secondary | ICD-10-CM | POA: Diagnosis not present

## 2018-01-16 DIAGNOSIS — M542 Cervicalgia: Secondary | ICD-10-CM | POA: Diagnosis not present

## 2018-01-16 DIAGNOSIS — G904 Autonomic dysreflexia: Secondary | ICD-10-CM | POA: Diagnosis not present

## 2018-01-16 DIAGNOSIS — M62838 Other muscle spasm: Secondary | ICD-10-CM

## 2018-01-16 DIAGNOSIS — Z5181 Encounter for therapeutic drug level monitoring: Secondary | ICD-10-CM | POA: Diagnosis not present

## 2018-01-16 DIAGNOSIS — S14104S Unspecified injury at C4 level of cervical spinal cord, sequela: Secondary | ICD-10-CM | POA: Diagnosis not present

## 2018-01-16 MED ORDER — OXYCODONE-ACETAMINOPHEN 7.5-325 MG PO TABS: 1.0000 | ORAL_TABLET | Freq: Four times a day (QID) | ORAL | 0 refills | Status: DC | PRN

## 2018-01-16 MED ORDER — CITALOPRAM HYDROBROMIDE 10 MG PO TABS: 10.0000 mg | ORAL_TABLET | Freq: Every day | ORAL | 2 refills | Status: DC

## 2018-01-16 MED ORDER — POLYETHYLENE GLYCOL 3350 17 G PO PACK: 17.0000 g | PACK | Freq: Every day | ORAL | 3 refills | Status: DC
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
## 2018-01-31 ENCOUNTER — Other Ambulatory Visit: Payer: Self-pay | Admitting: Registered Nurse

## 2018-01-31 DIAGNOSIS — S14101S Unspecified injury at C1 level of cervical spinal cord, sequela: Secondary | ICD-10-CM

## 2018-01-31 DIAGNOSIS — N319 Neuromuscular dysfunction of bladder, unspecified: Secondary | ICD-10-CM

## 2018-01-31 DIAGNOSIS — G8250 Quadriplegia, unspecified: Secondary | ICD-10-CM

## 2018-02-13 ENCOUNTER — Encounter: Payer: Medicaid Other | Attending: Physical Medicine & Rehabilitation | Admitting: Registered Nurse

## 2018-02-13 ENCOUNTER — Encounter: Payer: Self-pay | Admitting: Registered Nurse

## 2018-02-13 DIAGNOSIS — Z79899 Other long term (current) drug therapy: Secondary | ICD-10-CM | POA: Diagnosis not present

## 2018-02-13 DIAGNOSIS — S14104S Unspecified injury at C4 level of cervical spinal cord, sequela: Secondary | ICD-10-CM | POA: Diagnosis present

## 2018-02-13 DIAGNOSIS — S14101S Unspecified injury at C1 level of cervical spinal cord, sequela: Secondary | ICD-10-CM

## 2018-02-13 DIAGNOSIS — G8250 Quadriplegia, unspecified: Secondary | ICD-10-CM | POA: Diagnosis not present

## 2018-02-13 DIAGNOSIS — N319 Neuromuscular dysfunction of bladder, unspecified: Secondary | ICD-10-CM | POA: Diagnosis not present

## 2018-02-13 DIAGNOSIS — Z7901 Long term (current) use of anticoagulants: Secondary | ICD-10-CM | POA: Diagnosis not present

## 2018-02-13 DIAGNOSIS — F17200 Nicotine dependence, unspecified, uncomplicated: Secondary | ICD-10-CM | POA: Diagnosis not present

## 2018-02-13 DIAGNOSIS — Z5181 Encounter for therapeutic drug level monitoring: Secondary | ICD-10-CM | POA: Diagnosis not present

## 2018-02-13 DIAGNOSIS — F329 Major depressive disorder, single episode, unspecified: Secondary | ICD-10-CM | POA: Diagnosis not present

## 2018-02-13 DIAGNOSIS — Z981 Arthrodesis status: Secondary | ICD-10-CM | POA: Insufficient documentation

## 2018-02-13 DIAGNOSIS — M62838 Other muscle spasm: Secondary | ICD-10-CM

## 2018-02-13 DIAGNOSIS — K592 Neurogenic bowel, not elsewhere classified: Secondary | ICD-10-CM | POA: Diagnosis not present

## 2018-02-13 DIAGNOSIS — F419 Anxiety disorder, unspecified: Secondary | ICD-10-CM | POA: Diagnosis not present

## 2018-02-13 DIAGNOSIS — R252 Cramp and spasm: Secondary | ICD-10-CM | POA: Insufficient documentation

## 2018-02-13 MED ORDER — OXYCODONE-ACETAMINOPHEN 7.5-325 MG PO TABS: 1.0000 | ORAL_TABLET | Freq: Four times a day (QID) | ORAL | 0 refills | Status: DC | PRN

## 2018-02-13 MED ORDER — CLONAZEPAM 0.5 MG PO TABS
0.5000 mg | ORAL_TABLET | Freq: Three times a day (TID) | ORAL | 2 refills | Status: DC
Start: 2018-02-13 — End: 2018-04-10
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
## 2018-02-23 ENCOUNTER — Other Ambulatory Visit: Payer: Self-pay | Admitting: Physical Medicine & Rehabilitation

## 2018-02-24 ENCOUNTER — Telehealth: Payer: Self-pay | Admitting: *Deleted

## 2018-02-24 NOTE — Telephone Encounter (Signed)
Edward LongsJoseph called to let Bruce and Riley Lamunice know it is time for the 6 month PA for his oxycodone.

## 2018-02-25 NOTE — Telephone Encounter (Signed)
Checked the status on [name] prior Edward [name], it has returned as being approved until 08-24-2018, patient notified

## 2018-02-25 NOTE — Telephone Encounter (Signed)
Will begin prior auth immediately for Oxycodone through [facility]

## 2018-03-13 ENCOUNTER — Encounter: Payer: Self-pay | Admitting: Registered Nurse

## 2018-03-13 ENCOUNTER — Encounter: Payer: Medicaid Other | Attending: Physical Medicine & Rehabilitation | Admitting: Registered Nurse

## 2018-03-13 VITALS — BP 121/86 | HR 70 | Ht 72.0 in | Wt 168.0 lb

## 2018-03-13 DIAGNOSIS — S14104S Unspecified injury at C4 level of cervical spinal cord, sequela: Secondary | ICD-10-CM | POA: Insufficient documentation

## 2018-03-13 DIAGNOSIS — Z79899 Other long term (current) drug therapy: Secondary | ICD-10-CM

## 2018-03-13 DIAGNOSIS — F17200 Nicotine dependence, unspecified, uncomplicated: Secondary | ICD-10-CM | POA: Diagnosis not present

## 2018-03-13 DIAGNOSIS — N319 Neuromuscular dysfunction of bladder, unspecified: Secondary | ICD-10-CM | POA: Insufficient documentation

## 2018-03-13 DIAGNOSIS — M62838 Other muscle spasm: Secondary | ICD-10-CM | POA: Diagnosis not present

## 2018-03-13 DIAGNOSIS — M792 Neuralgia and neuritis, unspecified: Secondary | ICD-10-CM | POA: Diagnosis not present

## 2018-03-13 DIAGNOSIS — Z5181 Encounter for therapeutic drug level monitoring: Secondary | ICD-10-CM

## 2018-03-13 DIAGNOSIS — R252 Cramp and spasm: Secondary | ICD-10-CM | POA: Insufficient documentation

## 2018-03-13 DIAGNOSIS — M7582 Other shoulder lesions, left shoulder: Secondary | ICD-10-CM

## 2018-03-13 DIAGNOSIS — M25562 Pain in left knee: Secondary | ICD-10-CM | POA: Diagnosis not present

## 2018-03-13 DIAGNOSIS — Z981 Arthrodesis status: Secondary | ICD-10-CM | POA: Diagnosis not present

## 2018-03-13 DIAGNOSIS — K592 Neurogenic bowel, not elsewhere classified: Secondary | ICD-10-CM | POA: Diagnosis not present

## 2018-03-13 DIAGNOSIS — M25561 Pain in right knee: Secondary | ICD-10-CM | POA: Diagnosis not present

## 2018-03-13 DIAGNOSIS — F419 Anxiety disorder, unspecified: Secondary | ICD-10-CM | POA: Diagnosis not present

## 2018-03-13 DIAGNOSIS — Z7901 Long term (current) use of anticoagulants: Secondary | ICD-10-CM | POA: Diagnosis not present

## 2018-03-13 DIAGNOSIS — F329 Major depressive disorder, single episode, unspecified: Secondary | ICD-10-CM | POA: Insufficient documentation

## 2018-03-13 DIAGNOSIS — G894 Chronic pain syndrome: Secondary | ICD-10-CM

## 2018-03-13 DIAGNOSIS — G8250 Quadriplegia, unspecified: Secondary | ICD-10-CM

## 2018-03-13 DIAGNOSIS — S14101S Unspecified injury at C1 level of cervical spinal cord, sequela: Secondary | ICD-10-CM

## 2018-03-13 DIAGNOSIS — M778 Other enthesopathies, not elsewhere classified: Secondary | ICD-10-CM

## 2018-03-13 MED ORDER — OXYCODONE-ACETAMINOPHEN 7.5-325 MG PO TABS: 1.0000 | ORAL_TABLET | Freq: Four times a day (QID) | ORAL | 0 refills | Status: DC | PRN

## 2018-03-13 MED ORDER — DICLOFENAC SODIUM 75 MG PO TBEC
DELAYED_RELEASE_TABLET | ORAL | 2 refills | Status: DC
Start: 2018-03-13 — End: 2018-04-29
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
## 2018-03-24 ENCOUNTER — Other Ambulatory Visit: Payer: Self-pay | Admitting: Physical Medicine & Rehabilitation

## 2018-03-30 ENCOUNTER — Other Ambulatory Visit: Payer: Self-pay | Admitting: Registered Nurse

## 2018-04-10 ENCOUNTER — Encounter: Payer: Medicaid Other | Attending: Physical Medicine & Rehabilitation | Admitting: Registered Nurse

## 2018-04-10 ENCOUNTER — Encounter: Payer: Self-pay | Admitting: Registered Nurse

## 2018-04-10 VITALS — BP 101/77 | HR 91 | Ht 72.0 in | Wt 164.0 lb

## 2018-04-10 DIAGNOSIS — Z7901 Long term (current) use of anticoagulants: Secondary | ICD-10-CM | POA: Insufficient documentation

## 2018-04-10 DIAGNOSIS — S14101S Unspecified injury at C1 level of cervical spinal cord, sequela: Secondary | ICD-10-CM | POA: Diagnosis not present

## 2018-04-10 DIAGNOSIS — S14104S Unspecified injury at C4 level of cervical spinal cord, sequela: Secondary | ICD-10-CM | POA: Insufficient documentation

## 2018-04-10 DIAGNOSIS — M778 Other enthesopathies, not elsewhere classified: Secondary | ICD-10-CM

## 2018-04-10 DIAGNOSIS — G8929 Other chronic pain: Secondary | ICD-10-CM

## 2018-04-10 DIAGNOSIS — M2550 Pain in unspecified joint: Secondary | ICD-10-CM

## 2018-04-10 DIAGNOSIS — F419 Anxiety disorder, unspecified: Secondary | ICD-10-CM | POA: Diagnosis not present

## 2018-04-10 DIAGNOSIS — F329 Major depressive disorder, single episode, unspecified: Secondary | ICD-10-CM | POA: Insufficient documentation

## 2018-04-10 DIAGNOSIS — M545 Low back pain, unspecified: Secondary | ICD-10-CM

## 2018-04-10 DIAGNOSIS — M542 Cervicalgia: Secondary | ICD-10-CM

## 2018-04-10 DIAGNOSIS — M5412 Radiculopathy, cervical region: Secondary | ICD-10-CM | POA: Diagnosis not present

## 2018-04-10 DIAGNOSIS — K592 Neurogenic bowel, not elsewhere classified: Secondary | ICD-10-CM | POA: Insufficient documentation

## 2018-04-10 DIAGNOSIS — G8250 Quadriplegia, unspecified: Secondary | ICD-10-CM

## 2018-04-10 DIAGNOSIS — N319 Neuromuscular dysfunction of bladder, unspecified: Secondary | ICD-10-CM | POA: Diagnosis not present

## 2018-04-10 DIAGNOSIS — G894 Chronic pain syndrome: Secondary | ICD-10-CM | POA: Diagnosis not present

## 2018-04-10 DIAGNOSIS — F17200 Nicotine dependence, unspecified, uncomplicated: Secondary | ICD-10-CM | POA: Insufficient documentation

## 2018-04-10 DIAGNOSIS — R252 Cramp and spasm: Secondary | ICD-10-CM | POA: Insufficient documentation

## 2018-04-10 DIAGNOSIS — Z5181 Encounter for therapeutic drug level monitoring: Secondary | ICD-10-CM | POA: Diagnosis not present

## 2018-04-10 DIAGNOSIS — Z981 Arthrodesis status: Secondary | ICD-10-CM | POA: Insufficient documentation

## 2018-04-10 DIAGNOSIS — Z79899 Other long term (current) drug therapy: Secondary | ICD-10-CM | POA: Diagnosis not present

## 2018-04-10 DIAGNOSIS — M62838 Other muscle spasm: Secondary | ICD-10-CM

## 2018-04-10 DIAGNOSIS — M7582 Other shoulder lesions, left shoulder: Secondary | ICD-10-CM | POA: Diagnosis not present

## 2018-04-10 MED ORDER — GABAPENTIN 300 MG PO CAPS: ORAL_CAPSULE | ORAL | 3 refills | Status: DC

## 2018-04-10 MED ORDER — OXYCODONE-ACETAMINOPHEN 7.5-325 MG PO TABS: 1.0000 | ORAL_TABLET | Freq: Four times a day (QID) | ORAL | 0 refills | Status: DC | PRN

## 2018-04-10 MED ORDER — CLONAZEPAM 0.5 MG PO TABS: 0.5000 mg | ORAL_TABLET | Freq: Three times a day (TID) | ORAL | 3 refills | Status: DC
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
## 2018-04-14 ENCOUNTER — Telehealth: Payer: Self-pay | Admitting: Registered Nurse

## 2018-04-14 MED ORDER — SERTRALINE HCL 25 MG PO TABS: 25.0000 mg | ORAL_TABLET | Freq: Every day | ORAL | 1 refills | Status: DC

## 2018-04-14 NOTE — Telephone Encounter (Signed)
Spoke with Dr. [name] this morning regarding Edward [name] adverse effects with Celexa. Edward [name] reporting he was on Zoloft in the past without adverse reaction. This was discussed with Dr. [name] he agrees with plan of Zoloft 25 md daily dose. Placed a call to Edward [name] no answer, left message to return the call.   Will educate Edward [name] regarding Zoloft and Voltaren when he returns the call.

## 2018-04-29 ENCOUNTER — Telehealth: Payer: Self-pay

## 2018-04-29 DIAGNOSIS — S14101S Unspecified injury at C1 level of cervical spinal cord, sequela: Secondary | ICD-10-CM

## 2018-04-29 DIAGNOSIS — G8250 Quadriplegia, unspecified: Secondary | ICD-10-CM

## 2018-04-29 DIAGNOSIS — M792 Neuralgia and neuritis, unspecified: Secondary | ICD-10-CM

## 2018-04-29 MED ORDER — DICLOFENAC SODIUM 75 MG PO TBEC: DELAYED_RELEASE_TABLET | ORAL | 2 refills | Status: DC

## 2018-04-29 NOTE — Telephone Encounter (Signed)
Patient called requesting a refill of diclofenac

## 2018-05-01 NOTE — Telephone Encounter (Signed)
Prior Berkley Harvey has returned as approved 05-01-2018

## 2018-05-08 ENCOUNTER — Encounter: Payer: Self-pay | Admitting: Registered Nurse

## 2018-05-08 ENCOUNTER — Encounter: Payer: Medicaid Other | Attending: Physical Medicine & Rehabilitation | Admitting: Registered Nurse

## 2018-05-08 VITALS — BP 116/79 | HR 83 | Ht 72.0 in | Wt 162.0 lb

## 2018-05-08 DIAGNOSIS — M2550 Pain in unspecified joint: Secondary | ICD-10-CM

## 2018-05-08 DIAGNOSIS — S14101S Unspecified injury at C1 level of cervical spinal cord, sequela: Secondary | ICD-10-CM | POA: Diagnosis not present

## 2018-05-08 DIAGNOSIS — Z981 Arthrodesis status: Secondary | ICD-10-CM | POA: Insufficient documentation

## 2018-05-08 DIAGNOSIS — Z7901 Long term (current) use of anticoagulants: Secondary | ICD-10-CM | POA: Insufficient documentation

## 2018-05-08 DIAGNOSIS — K592 Neurogenic bowel, not elsewhere classified: Secondary | ICD-10-CM | POA: Insufficient documentation

## 2018-05-08 DIAGNOSIS — F419 Anxiety disorder, unspecified: Secondary | ICD-10-CM | POA: Insufficient documentation

## 2018-05-08 DIAGNOSIS — G8929 Other chronic pain: Secondary | ICD-10-CM

## 2018-05-08 DIAGNOSIS — M545 Low back pain: Secondary | ICD-10-CM | POA: Diagnosis not present

## 2018-05-08 DIAGNOSIS — Z79899 Other long term (current) drug therapy: Secondary | ICD-10-CM

## 2018-05-08 DIAGNOSIS — M542 Cervicalgia: Secondary | ICD-10-CM | POA: Diagnosis not present

## 2018-05-08 DIAGNOSIS — R252 Cramp and spasm: Secondary | ICD-10-CM | POA: Insufficient documentation

## 2018-05-08 DIAGNOSIS — S14104S Unspecified injury at C4 level of cervical spinal cord, sequela: Secondary | ICD-10-CM | POA: Insufficient documentation

## 2018-05-08 DIAGNOSIS — F329 Major depressive disorder, single episode, unspecified: Secondary | ICD-10-CM | POA: Insufficient documentation

## 2018-05-08 DIAGNOSIS — F3289 Other specified depressive episodes: Secondary | ICD-10-CM | POA: Diagnosis not present

## 2018-05-08 DIAGNOSIS — Z5181 Encounter for therapeutic drug level monitoring: Secondary | ICD-10-CM

## 2018-05-08 DIAGNOSIS — N319 Neuromuscular dysfunction of bladder, unspecified: Secondary | ICD-10-CM | POA: Diagnosis not present

## 2018-05-08 DIAGNOSIS — F17200 Nicotine dependence, unspecified, uncomplicated: Secondary | ICD-10-CM | POA: Diagnosis not present

## 2018-05-08 DIAGNOSIS — M5412 Radiculopathy, cervical region: Secondary | ICD-10-CM | POA: Diagnosis not present

## 2018-05-08 DIAGNOSIS — G8250 Quadriplegia, unspecified: Secondary | ICD-10-CM | POA: Diagnosis not present

## 2018-05-08 DIAGNOSIS — G894 Chronic pain syndrome: Secondary | ICD-10-CM | POA: Diagnosis not present

## 2018-05-08 DIAGNOSIS — M62838 Other muscle spasm: Secondary | ICD-10-CM

## 2018-05-08 MED ORDER — OXYCODONE-ACETAMINOPHEN 7.5-325 MG PO TABS: 1.0000 | ORAL_TABLET | Freq: Four times a day (QID) | ORAL | 0 refills | Status: DC | PRN
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
## 2018-05-19 ENCOUNTER — Other Ambulatory Visit: Payer: Self-pay | Admitting: Physical Medicine & Rehabilitation

## 2018-06-03 ENCOUNTER — Other Ambulatory Visit: Payer: Self-pay

## 2018-06-03 ENCOUNTER — Encounter: Payer: Medicaid Other | Attending: Physical Medicine & Rehabilitation | Admitting: Physical Medicine & Rehabilitation

## 2018-06-03 ENCOUNTER — Encounter: Payer: Self-pay | Admitting: Physical Medicine & Rehabilitation

## 2018-06-03 DIAGNOSIS — M62838 Other muscle spasm: Secondary | ICD-10-CM | POA: Diagnosis not present

## 2018-06-03 DIAGNOSIS — F419 Anxiety disorder, unspecified: Secondary | ICD-10-CM | POA: Diagnosis not present

## 2018-06-03 DIAGNOSIS — F329 Major depressive disorder, single episode, unspecified: Secondary | ICD-10-CM | POA: Diagnosis not present

## 2018-06-03 DIAGNOSIS — Z7901 Long term (current) use of anticoagulants: Secondary | ICD-10-CM | POA: Diagnosis not present

## 2018-06-03 DIAGNOSIS — Z981 Arthrodesis status: Secondary | ICD-10-CM | POA: Diagnosis not present

## 2018-06-03 DIAGNOSIS — F17200 Nicotine dependence, unspecified, uncomplicated: Secondary | ICD-10-CM | POA: Insufficient documentation

## 2018-06-03 DIAGNOSIS — N319 Neuromuscular dysfunction of bladder, unspecified: Secondary | ICD-10-CM | POA: Insufficient documentation

## 2018-06-03 DIAGNOSIS — S14104S Unspecified injury at C4 level of cervical spinal cord, sequela: Secondary | ICD-10-CM | POA: Insufficient documentation

## 2018-06-03 DIAGNOSIS — Z5181 Encounter for therapeutic drug level monitoring: Secondary | ICD-10-CM | POA: Diagnosis not present

## 2018-06-03 DIAGNOSIS — K592 Neurogenic bowel, not elsewhere classified: Secondary | ICD-10-CM | POA: Insufficient documentation

## 2018-06-03 DIAGNOSIS — S14101S Unspecified injury at C1 level of cervical spinal cord, sequela: Secondary | ICD-10-CM | POA: Diagnosis not present

## 2018-06-03 DIAGNOSIS — Z79899 Other long term (current) drug therapy: Secondary | ICD-10-CM

## 2018-06-03 DIAGNOSIS — G8250 Quadriplegia, unspecified: Secondary | ICD-10-CM

## 2018-06-03 DIAGNOSIS — R252 Cramp and spasm: Secondary | ICD-10-CM | POA: Diagnosis not present

## 2018-06-03 MED ORDER — OXYCODONE-ACETAMINOPHEN 7.5-325 MG PO TABS: 1.0000 | ORAL_TABLET | Freq: Four times a day (QID) | ORAL | 0 refills | Status: DC | PRN

## 2018-06-03 NOTE — Patient Instructions (Signed)
TRY TO WORK ON REDUCING YOUR PERCOCET OVER THE NEXT FEW MONTHS. I WILL BEGIN REDUCING IT IN MARCH IF YOU HAVEN'T.    PLEASE FEEL FREE TO CALL OUR OFFICE WITH ANY PROBLEMS OR QUESTIONS [phone])
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
## 2018-06-21 ENCOUNTER — Other Ambulatory Visit: Payer: Self-pay | Admitting: Physical Medicine & Rehabilitation

## 2018-06-29 ENCOUNTER — Ambulatory Visit: Payer: Self-pay | Admitting: Registered Nurse

## 2018-06-30 ENCOUNTER — Encounter: Payer: Self-pay | Admitting: Registered Nurse

## 2018-06-30 ENCOUNTER — Encounter: Payer: Medicaid Other | Attending: Physical Medicine & Rehabilitation | Admitting: Registered Nurse

## 2018-06-30 VITALS — BP 119/85 | HR 77 | Resp 14 | Ht 72.0 in | Wt 155.0 lb

## 2018-06-30 DIAGNOSIS — S14104S Unspecified injury at C4 level of cervical spinal cord, sequela: Secondary | ICD-10-CM | POA: Insufficient documentation

## 2018-06-30 DIAGNOSIS — G894 Chronic pain syndrome: Secondary | ICD-10-CM

## 2018-06-30 DIAGNOSIS — F329 Major depressive disorder, single episode, unspecified: Secondary | ICD-10-CM | POA: Insufficient documentation

## 2018-06-30 DIAGNOSIS — N319 Neuromuscular dysfunction of bladder, unspecified: Secondary | ICD-10-CM | POA: Diagnosis not present

## 2018-06-30 DIAGNOSIS — M5412 Radiculopathy, cervical region: Secondary | ICD-10-CM | POA: Diagnosis not present

## 2018-06-30 DIAGNOSIS — M2550 Pain in unspecified joint: Secondary | ICD-10-CM

## 2018-06-30 DIAGNOSIS — F17200 Nicotine dependence, unspecified, uncomplicated: Secondary | ICD-10-CM | POA: Diagnosis not present

## 2018-06-30 DIAGNOSIS — F419 Anxiety disorder, unspecified: Secondary | ICD-10-CM | POA: Insufficient documentation

## 2018-06-30 DIAGNOSIS — Z79899 Other long term (current) drug therapy: Secondary | ICD-10-CM

## 2018-06-30 DIAGNOSIS — M545 Low back pain: Secondary | ICD-10-CM

## 2018-06-30 DIAGNOSIS — M542 Cervicalgia: Secondary | ICD-10-CM | POA: Diagnosis not present

## 2018-06-30 DIAGNOSIS — Z5181 Encounter for therapeutic drug level monitoring: Secondary | ICD-10-CM | POA: Diagnosis not present

## 2018-06-30 DIAGNOSIS — K592 Neurogenic bowel, not elsewhere classified: Secondary | ICD-10-CM

## 2018-06-30 DIAGNOSIS — M62838 Other muscle spasm: Secondary | ICD-10-CM | POA: Diagnosis not present

## 2018-06-30 DIAGNOSIS — F3289 Other specified depressive episodes: Secondary | ICD-10-CM

## 2018-06-30 DIAGNOSIS — Z981 Arthrodesis status: Secondary | ICD-10-CM | POA: Diagnosis not present

## 2018-06-30 DIAGNOSIS — G8250 Quadriplegia, unspecified: Secondary | ICD-10-CM | POA: Diagnosis not present

## 2018-06-30 DIAGNOSIS — M792 Neuralgia and neuritis, unspecified: Secondary | ICD-10-CM

## 2018-06-30 DIAGNOSIS — G8929 Other chronic pain: Secondary | ICD-10-CM

## 2018-06-30 DIAGNOSIS — R252 Cramp and spasm: Secondary | ICD-10-CM | POA: Insufficient documentation

## 2018-06-30 DIAGNOSIS — Z7901 Long term (current) use of anticoagulants: Secondary | ICD-10-CM | POA: Insufficient documentation

## 2018-06-30 DIAGNOSIS — S14101S Unspecified injury at C1 level of cervical spinal cord, sequela: Secondary | ICD-10-CM | POA: Diagnosis not present

## 2018-06-30 DIAGNOSIS — M79674 Pain in right toe(s): Secondary | ICD-10-CM

## 2018-06-30 DIAGNOSIS — M79675 Pain in left toe(s): Secondary | ICD-10-CM

## 2018-06-30 MED ORDER — CLONAZEPAM 0.5 MG PO TABS: 0.5000 mg | ORAL_TABLET | Freq: Three times a day (TID) | ORAL | 3 refills | Status: DC

## 2018-06-30 MED ORDER — BACLOFEN 20 MG PO TABS: ORAL_TABLET | ORAL | 1 refills | Status: DC

## 2018-06-30 MED ORDER — OXYCODONE-ACETAMINOPHEN 7.5-325 MG PO TABS: 1.0000 | ORAL_TABLET | Freq: Four times a day (QID) | ORAL | 0 refills | Status: DC | PRN

## 2018-06-30 MED ORDER — DICLOFENAC SODIUM 75 MG PO TBEC: DELAYED_RELEASE_TABLET | ORAL | 2 refills | Status: DC
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
## 2018-07-03 ENCOUNTER — Encounter: Payer: Medicaid Other | Admitting: Registered Nurse

## 2018-07-06 DIAGNOSIS — L03031 Cellulitis of right toe: Secondary | ICD-10-CM | POA: Diagnosis not present

## 2018-07-06 DIAGNOSIS — L602 Onychogryphosis: Secondary | ICD-10-CM | POA: Diagnosis not present

## 2018-07-06 DIAGNOSIS — G629 Polyneuropathy, unspecified: Secondary | ICD-10-CM | POA: Diagnosis not present

## 2018-07-10 ENCOUNTER — Ambulatory Visit: Payer: Self-pay | Admitting: Registered Nurse

## 2018-07-27 ENCOUNTER — Encounter: Payer: Medicaid Other | Attending: Physical Medicine & Rehabilitation | Admitting: Registered Nurse

## 2018-07-27 ENCOUNTER — Encounter: Payer: Self-pay | Admitting: Registered Nurse

## 2018-07-27 VITALS — BP 120/82 | HR 93 | Resp 14 | Ht 72.0 in | Wt 153.0 lb

## 2018-07-27 DIAGNOSIS — M542 Cervicalgia: Secondary | ICD-10-CM | POA: Diagnosis not present

## 2018-07-27 DIAGNOSIS — F17200 Nicotine dependence, unspecified, uncomplicated: Secondary | ICD-10-CM | POA: Insufficient documentation

## 2018-07-27 DIAGNOSIS — Z5181 Encounter for therapeutic drug level monitoring: Secondary | ICD-10-CM

## 2018-07-27 DIAGNOSIS — M545 Low back pain: Secondary | ICD-10-CM | POA: Diagnosis not present

## 2018-07-27 DIAGNOSIS — G8250 Quadriplegia, unspecified: Secondary | ICD-10-CM

## 2018-07-27 DIAGNOSIS — Z981 Arthrodesis status: Secondary | ICD-10-CM | POA: Insufficient documentation

## 2018-07-27 DIAGNOSIS — M792 Neuralgia and neuritis, unspecified: Secondary | ICD-10-CM | POA: Diagnosis not present

## 2018-07-27 DIAGNOSIS — M2550 Pain in unspecified joint: Secondary | ICD-10-CM | POA: Diagnosis not present

## 2018-07-27 DIAGNOSIS — S14101S Unspecified injury at C1 level of cervical spinal cord, sequela: Secondary | ICD-10-CM

## 2018-07-27 DIAGNOSIS — M5412 Radiculopathy, cervical region: Secondary | ICD-10-CM | POA: Diagnosis not present

## 2018-07-27 DIAGNOSIS — Z7901 Long term (current) use of anticoagulants: Secondary | ICD-10-CM | POA: Diagnosis not present

## 2018-07-27 DIAGNOSIS — F329 Major depressive disorder, single episode, unspecified: Secondary | ICD-10-CM | POA: Diagnosis not present

## 2018-07-27 DIAGNOSIS — R252 Cramp and spasm: Secondary | ICD-10-CM | POA: Insufficient documentation

## 2018-07-27 DIAGNOSIS — F419 Anxiety disorder, unspecified: Secondary | ICD-10-CM | POA: Diagnosis not present

## 2018-07-27 DIAGNOSIS — N319 Neuromuscular dysfunction of bladder, unspecified: Secondary | ICD-10-CM | POA: Diagnosis not present

## 2018-07-27 DIAGNOSIS — G894 Chronic pain syndrome: Secondary | ICD-10-CM | POA: Diagnosis not present

## 2018-07-27 DIAGNOSIS — Z79899 Other long term (current) drug therapy: Secondary | ICD-10-CM | POA: Diagnosis not present

## 2018-07-27 DIAGNOSIS — G8929 Other chronic pain: Secondary | ICD-10-CM

## 2018-07-27 DIAGNOSIS — S14104S Unspecified injury at C4 level of cervical spinal cord, sequela: Secondary | ICD-10-CM | POA: Diagnosis not present

## 2018-07-27 DIAGNOSIS — K592 Neurogenic bowel, not elsewhere classified: Secondary | ICD-10-CM | POA: Diagnosis not present

## 2018-07-27 DIAGNOSIS — M62838 Other muscle spasm: Secondary | ICD-10-CM

## 2018-07-27 MED ORDER — OXYCODONE-ACETAMINOPHEN 7.5-325 MG PO TABS: 1.0000 | ORAL_TABLET | Freq: Four times a day (QID) | ORAL | 0 refills | Status: DC | PRN

## 2018-07-27 MED ORDER — PANTOPRAZOLE SODIUM 20 MG PO TBEC: DELAYED_RELEASE_TABLET | ORAL | 1 refills | Status: AC

## 2018-07-27 MED ORDER — DULOXETINE HCL 20 MG PO CPEP: 20.0000 mg | ORAL_CAPSULE | Freq: Every day | ORAL | 2 refills | Status: DC
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
## 2018-07-27 NOTE — Patient Instructions (Signed)
Tapentadol immediate-release oral tablets What is this medicine? TAPENTADOL (ta PEN ta dol) is a pain reliever. It is used to treat moderate to severe pain. This medicine may be used for other purposes; ask your health care provider or pharmacist if you have questions. COMMON BRAND NAME(S): Nucynta What should I tell my health care provider before I take this medicine? They need to know if you have any of these conditions: -gallbladder disease -head injury -history of a drug or alcohol abuse problem -if you often drink alcohol -kidney disease -liver disease -lung or breathing disease, like asthma -prostate disease -seizures -stomach or intestine problems -thyroid disease -an unusual or allergic reaction to tapentadol, other medicines, foods, dyes, or preservatives -pregnant or trying to get pregnant -breast-feeding How should I use this medicine? Take this medicine by mouth with a glass of water. If the medicine upsets your stomach, take it with food or milk. Follow the directions on the prescription label. Do not take more than you are told to take. A special MedGuide will be given to you by the pharmacist with each prescription and refill. Be sure to read this information carefully each time. Talk to your pediatrician regarding the use of this medicine in children. Special care may be needed. Overdosage: If you think you have taken too much of this medicine contact a poison control center or emergency room at once. NOTE: This medicine is only for you. Do not share this medicine with others. What if I miss a dose? If you miss a dose, take it as soon as you can. If it is almost time for your next dose, take only that dose. Do not take double or extra doses. What may interact with this medicine? Do not take this medicine with any of the following medications: -MAOIs like Carbex, Eldepryl, Marplan, Nardil, and Parnate This medicine may also interact with the following  medications: -alcohol -antihistamines for allergy, cough and cold -atropine -certain medicines for anxiety or sleep -certain medicines for bladder problems like oxybutynin, tolterodine -certain medicines for depression like amitriptyline, fluoxetine, sertraline -certain medicines for migraine headache like almotriptan, eletriptan, frovatriptan, naratriptan, rizatriptan, sumatriptan, zolmitriptan -certain medicines for Parkinson's disease like benztropine, trihexyphenidyl -certain medicines for seizures like phenobarbital, primidone -certain medicines for stomach problems like dicyclomine, hyoscyamine -certain medicines for travel sickness like scopolamine -general anesthetics like halothane, isoflurane, methoxyflurane, propofol -ipratropium -local anesthetics like lidocaine, pramoxine, tetracaine -medicines that relax muscles for surgery -other narcotic medicines for pain or cough -phenothiazines like chlorpromazine, mesoridazine, prochlorperazine, thioridazine This list may not describe all possible interactions. Give your health care provider a list of all the medicines, herbs, non-prescription drugs, or dietary supplements you use. Also tell them if you smoke, drink alcohol, or use illegal drugs. Some items may interact with your medicine. What should I watch for while using this medicine? Tell your doctor or health care professional if your pain does not go away, if it gets worse, or if you have new or a different type of pain. You may develop tolerance to the medicine. Tolerance means that you will need a higher dose of the medicine for pain relief. Tolerance is normal and is expected if you take the medicine for a long time. Do not suddenly stop taking your medicine because you may develop a severe reaction. Your body becomes used to the medicine. This does NOT mean you are addicted. Addiction is a behavior related to getting and using a drug for a non-medical reason. If you have pain, you    have a medical reason to take pain medicine. Your doctor will tell you how much medicine to take. If your doctor wants you to stop the medicine, the dose will be slowly lowered over time to avoid any side effects. There are different types of narcotic medicines (opiates). If you take more than one type at the same time or if you are taking another medicine that also causes drowsiness, you may have more side effects. Give your health care provider a list of all medicines you use. Your doctor will tell you how much medicine to take. Do not take more medicine than directed. Call emergency for help if you have problems breathing or unusual sleepiness. You may get drowsy or dizzy. Do not drive, use machinery, or do anything that needs mental alertness until you know how this medicine affects you. Do not stand or sit up quickly, especially if you are an older patient. This reduces the risk of dizzy or fainting spells. Alcohol may interfere with the effect of this medicine. Avoid alcoholic drinks. This medicine will cause constipation. Try to have a bowel movement at least every 2 to 3 days. If you do not have a bowel movement for 3 days, call your doctor or health care professional. Your mouth may get dry. Chewing sugarless gum or sucking heard candy, and drinking plenty of water may help. Contact your doctor if the problem does not go away or is severe. What side effects may I notice from receiving this medicine? Side effects that you should report to your doctor or health care professional as soon as possible: -allergic reactions like skin rash, itching or hives, swelling of the face, lips, or tongue -breathing problems -confusion -seizures -signs and symptoms of low bleed pressure like dizziness; feeling faint or lightheaded, falls; unusually weak or tired -trouble passing urine or change in the amount of urine Side effects that usually do not require medical attention (report to your doctor or health care  professional if they continue or are bothersome): -constipation -dry mouth -nausea, vomiting -tiredness This list may not describe all possible side effects. Call your doctor for medical advice about side effects. You may report side effects to FDA at 1-800-FDA-1088. Where should I keep my medicine? Keep out of the reach of children. This medicine can be abused. Keep your medicine in a safe place to protect it from theft. Do not share this medicine with anyone. Selling or giving away this medicine is dangerous and against the law. Store at room temperature between 15 and 30 degrees C (59 and 86 degrees F). Protect from moisture. This medicine may cause harm and death if it is taken by other adults, children, or pets. Return medicine that has not been used to an official disposal site. Contact the DEA at 1-800-882-9539 or your city/county government to find a site. If you cannot return the medicine, flush it down the toilet. Do not use the medicine after the expiration date. NOTE: This sheet is a summary. It may not cover all possible information. If you have questions about this medicine, talk to your doctor, pharmacist, or health care provider.  2019 Elsevier/Gold Standard (2016-11-12 16:25:49)  

## 2018-08-18 NOTE — Telephone Encounter (Signed)
Message from patient

## 2018-08-22 ENCOUNTER — Other Ambulatory Visit: Payer: Self-pay | Admitting: Registered Nurse

## 2018-08-22 DIAGNOSIS — S14101S Unspecified injury at C1 level of cervical spinal cord, sequela: Secondary | ICD-10-CM

## 2018-08-22 DIAGNOSIS — N319 Neuromuscular dysfunction of bladder, unspecified: Secondary | ICD-10-CM

## 2018-08-22 DIAGNOSIS — G8250 Quadriplegia, unspecified: Secondary | ICD-10-CM

## 2018-08-24 ENCOUNTER — Telehealth: Payer: Self-pay | Admitting: Registered Nurse

## 2018-08-24 MED ORDER — TAPENTADOL HCL 50 MG PO TABS: 50.0000 mg | ORAL_TABLET | Freq: Two times a day (BID) | ORAL | 0 refills | Status: DC | PRN

## 2018-08-24 NOTE — Telephone Encounter (Signed)
Nucynta e- scribed. Message sent to Edward [name] via My Chart message.

## 2018-08-24 NOTE — Telephone Encounter (Signed)
Message from patient

## 2018-08-31 ENCOUNTER — Encounter: Payer: Medicaid Other | Admitting: Registered Nurse

## 2018-09-07 ENCOUNTER — Encounter: Payer: Medicaid Other | Attending: Physical Medicine & Rehabilitation | Admitting: Registered Nurse

## 2018-09-07 ENCOUNTER — Encounter: Payer: Self-pay | Admitting: Registered Nurse

## 2018-09-07 ENCOUNTER — Other Ambulatory Visit: Payer: Self-pay

## 2018-09-07 VITALS — BP 109/75 | HR 78 | Ht 72.0 in | Wt 151.4 lb

## 2018-09-07 DIAGNOSIS — Z7901 Long term (current) use of anticoagulants: Secondary | ICD-10-CM | POA: Insufficient documentation

## 2018-09-07 DIAGNOSIS — M2550 Pain in unspecified joint: Secondary | ICD-10-CM

## 2018-09-07 DIAGNOSIS — R252 Cramp and spasm: Secondary | ICD-10-CM | POA: Diagnosis not present

## 2018-09-07 DIAGNOSIS — M5412 Radiculopathy, cervical region: Secondary | ICD-10-CM

## 2018-09-07 DIAGNOSIS — F419 Anxiety disorder, unspecified: Secondary | ICD-10-CM | POA: Insufficient documentation

## 2018-09-07 DIAGNOSIS — K592 Neurogenic bowel, not elsewhere classified: Secondary | ICD-10-CM | POA: Insufficient documentation

## 2018-09-07 DIAGNOSIS — M62838 Other muscle spasm: Secondary | ICD-10-CM | POA: Diagnosis not present

## 2018-09-07 DIAGNOSIS — M792 Neuralgia and neuritis, unspecified: Secondary | ICD-10-CM | POA: Diagnosis not present

## 2018-09-07 DIAGNOSIS — M545 Low back pain: Secondary | ICD-10-CM

## 2018-09-07 DIAGNOSIS — Z79899 Other long term (current) drug therapy: Secondary | ICD-10-CM | POA: Diagnosis not present

## 2018-09-07 DIAGNOSIS — G8250 Quadriplegia, unspecified: Secondary | ICD-10-CM | POA: Diagnosis not present

## 2018-09-07 DIAGNOSIS — S14104S Unspecified injury at C4 level of cervical spinal cord, sequela: Secondary | ICD-10-CM | POA: Diagnosis present

## 2018-09-07 DIAGNOSIS — G894 Chronic pain syndrome: Secondary | ICD-10-CM | POA: Diagnosis not present

## 2018-09-07 DIAGNOSIS — F17200 Nicotine dependence, unspecified, uncomplicated: Secondary | ICD-10-CM | POA: Diagnosis not present

## 2018-09-07 DIAGNOSIS — S14101S Unspecified injury at C1 level of cervical spinal cord, sequela: Secondary | ICD-10-CM

## 2018-09-07 DIAGNOSIS — G8929 Other chronic pain: Secondary | ICD-10-CM

## 2018-09-07 DIAGNOSIS — N319 Neuromuscular dysfunction of bladder, unspecified: Secondary | ICD-10-CM | POA: Insufficient documentation

## 2018-09-07 DIAGNOSIS — Z5181 Encounter for therapeutic drug level monitoring: Secondary | ICD-10-CM

## 2018-09-07 DIAGNOSIS — Z981 Arthrodesis status: Secondary | ICD-10-CM | POA: Diagnosis not present

## 2018-09-07 DIAGNOSIS — F329 Major depressive disorder, single episode, unspecified: Secondary | ICD-10-CM | POA: Insufficient documentation

## 2018-09-07 DIAGNOSIS — M542 Cervicalgia: Secondary | ICD-10-CM | POA: Diagnosis not present

## 2018-09-07 MED ORDER — OXYCODONE-ACETAMINOPHEN 7.5-325 MG PO TABS: 1.0000 | ORAL_TABLET | Freq: Four times a day (QID) | ORAL | 0 refills | Status: DC | PRN
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
## 2018-09-30 ENCOUNTER — Other Ambulatory Visit: Payer: Self-pay

## 2018-09-30 ENCOUNTER — Encounter: Payer: Medicaid Other | Attending: Physical Medicine & Rehabilitation | Admitting: Physical Medicine & Rehabilitation

## 2018-09-30 ENCOUNTER — Encounter: Payer: Self-pay | Admitting: Physical Medicine & Rehabilitation

## 2018-09-30 VITALS — BP 127/88 | HR 100 | Ht 72.0 in | Wt 147.0 lb

## 2018-09-30 DIAGNOSIS — R252 Cramp and spasm: Secondary | ICD-10-CM | POA: Diagnosis not present

## 2018-09-30 DIAGNOSIS — Z79899 Other long term (current) drug therapy: Secondary | ICD-10-CM

## 2018-09-30 DIAGNOSIS — F419 Anxiety disorder, unspecified: Secondary | ICD-10-CM | POA: Insufficient documentation

## 2018-09-30 DIAGNOSIS — M62838 Other muscle spasm: Secondary | ICD-10-CM | POA: Diagnosis not present

## 2018-09-30 DIAGNOSIS — S14104S Unspecified injury at C4 level of cervical spinal cord, sequela: Secondary | ICD-10-CM | POA: Diagnosis present

## 2018-09-30 DIAGNOSIS — N319 Neuromuscular dysfunction of bladder, unspecified: Secondary | ICD-10-CM | POA: Diagnosis not present

## 2018-09-30 DIAGNOSIS — G8250 Quadriplegia, unspecified: Secondary | ICD-10-CM | POA: Diagnosis not present

## 2018-09-30 DIAGNOSIS — K592 Neurogenic bowel, not elsewhere classified: Secondary | ICD-10-CM | POA: Diagnosis not present

## 2018-09-30 DIAGNOSIS — Z981 Arthrodesis status: Secondary | ICD-10-CM | POA: Insufficient documentation

## 2018-09-30 DIAGNOSIS — F17200 Nicotine dependence, unspecified, uncomplicated: Secondary | ICD-10-CM | POA: Insufficient documentation

## 2018-09-30 DIAGNOSIS — Z7901 Long term (current) use of anticoagulants: Secondary | ICD-10-CM | POA: Insufficient documentation

## 2018-09-30 DIAGNOSIS — G894 Chronic pain syndrome: Secondary | ICD-10-CM | POA: Diagnosis not present

## 2018-09-30 DIAGNOSIS — F329 Major depressive disorder, single episode, unspecified: Secondary | ICD-10-CM | POA: Diagnosis not present

## 2018-09-30 DIAGNOSIS — Z5181 Encounter for therapeutic drug level monitoring: Secondary | ICD-10-CM | POA: Diagnosis not present

## 2018-09-30 DIAGNOSIS — Z79891 Long term (current) use of opiate analgesic: Secondary | ICD-10-CM

## 2018-09-30 DIAGNOSIS — S14101S Unspecified injury at C1 level of cervical spinal cord, sequela: Secondary | ICD-10-CM | POA: Diagnosis not present

## 2018-09-30 MED ORDER — OXYCODONE-ACETAMINOPHEN 7.5-325 MG PO TABS: 1.0000 | ORAL_TABLET | Freq: Four times a day (QID) | ORAL | 0 refills | Status: DC | PRN

## 2018-09-30 MED ORDER — TIZANIDINE HCL 4 MG PO TABS: ORAL_TABLET | ORAL | 3 refills | Status: DC

## 2018-09-30 MED ORDER — BUPROPION HCL 75 MG PO TABS: 75.0000 mg | ORAL_TABLET | Freq: Two times a day (BID) | ORAL | 2 refills | Status: DC
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
## 2018-09-30 NOTE — Patient Instructions (Signed)
TIZANIDINE: 4-4-8MG  FOR 4 DAYS,  THEN 8-4-8MG  FOR 4DAYS THEN 8MG  THREE X DAILY

## 2018-10-06 LAB — DRUG TOX MONITOR 1 W/CONF, ORAL FLD
Alprazolam: NEGATIVE ng/mL (ref <0.50)
Amphetamines: NEGATIVE ng/mL (ref <10)
Barbiturates: NEGATIVE ng/mL (ref <10)
Benzodiazepines: POSITIVE ng/mL — AB (ref <0.50)
Buprenorphine: NEGATIVE ng/mL (ref <0.10)
Chlordiazepoxide: NEGATIVE ng/mL (ref <0.50)
Clonazepam: 0.99 ng/mL — ABNORMAL HIGH (ref <0.50)
Cocaine: NEGATIVE ng/mL (ref <5.0)
Codeine: NEGATIVE ng/mL (ref <2.5)
Cotinine: 155.7 ng/mL — ABNORMAL HIGH (ref <5.0)
Diazepam: NEGATIVE ng/mL (ref <0.50)
Dihydrocodeine: NEGATIVE ng/mL (ref <2.5)
Fentanyl: NEGATIVE ng/mL (ref <0.10)
Flunitrazepam: NEGATIVE ng/mL (ref <0.50)
Flurazepam: NEGATIVE ng/mL (ref <0.50)
HEROIN METABOLITE: NEGATIVE ng/mL (ref <1.0)
Hydrocodone: NEGATIVE ng/mL (ref <2.5)
Hydromorphone: NEGATIVE ng/mL (ref <2.5)
Lorazepam: NEGATIVE ng/mL (ref <0.50)
MARIJUANA: NEGATIVE ng/mL (ref <2.5)
MDMA: NEGATIVE ng/mL (ref <10)
Meprobamate: NEGATIVE ng/mL (ref <2.5)
Methadone: NEGATIVE ng/mL (ref <5.0)
Midazolam: NEGATIVE ng/mL (ref <0.50)
Morphine: NEGATIVE ng/mL (ref <2.5)
Nicotine Metabolite: POSITIVE ng/mL — AB (ref <5.0)
Nordiazepam: NEGATIVE ng/mL (ref <0.50)
Norhydrocodone: NEGATIVE ng/mL (ref <2.5)
Noroxycodone: 6.4 ng/mL — ABNORMAL HIGH (ref <2.5)
Opiates: POSITIVE ng/mL — AB (ref <2.5)
Oxazepam: NEGATIVE ng/mL (ref <0.50)
Oxycodone: 81.7 ng/mL — ABNORMAL HIGH (ref <2.5)
Oxymorphone: NEGATIVE ng/mL (ref <2.5)
Phencyclidine: NEGATIVE ng/mL (ref <10)
TAPENTADOL: NEGATIVE ng/mL (ref <5.0)
TRAMADOL: NEGATIVE ng/mL (ref <5.0)
Temazepam: NEGATIVE ng/mL (ref <0.50)
Triazolam: NEGATIVE ng/mL (ref <0.50)
Zolpidem: NEGATIVE ng/mL (ref <5.0)

## 2018-10-06 LAB — DRUG TOX ALC METAB W/CON, ORAL FLD: ALCOHOL METABOLITE: NEGATIVE ng/mL (ref <25)

## 2018-10-07 ENCOUNTER — Telehealth: Payer: Self-pay | Admitting: *Deleted

## 2018-10-07 NOTE — Telephone Encounter (Signed)
Oral swab drug screen was consistent for prescribed medications.  ?

## 2018-10-12 ENCOUNTER — Ambulatory Visit: Payer: Self-pay | Admitting: Registered Nurse
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
## 2018-10-15 ENCOUNTER — Other Ambulatory Visit: Payer: Self-pay | Admitting: Registered Nurse

## 2018-10-15 DIAGNOSIS — G8250 Quadriplegia, unspecified: Secondary | ICD-10-CM

## 2018-10-15 DIAGNOSIS — S14101S Unspecified injury at C1 level of cervical spinal cord, sequela: Secondary | ICD-10-CM

## 2018-10-15 DIAGNOSIS — N319 Neuromuscular dysfunction of bladder, unspecified: Secondary | ICD-10-CM

## 2018-10-16 ENCOUNTER — Other Ambulatory Visit: Payer: Self-pay | Admitting: Registered Nurse

## 2018-10-16 DIAGNOSIS — N319 Neuromuscular dysfunction of bladder, unspecified: Secondary | ICD-10-CM

## 2018-10-16 DIAGNOSIS — S14101S Unspecified injury at C1 level of cervical spinal cord, sequela: Secondary | ICD-10-CM

## 2018-10-16 DIAGNOSIS — G8250 Quadriplegia, unspecified: Secondary | ICD-10-CM

## 2018-10-21 ENCOUNTER — Telehealth: Payer: Self-pay | Admitting: Registered Nurse

## 2018-10-21 DIAGNOSIS — S14101S Unspecified injury at C1 level of cervical spinal cord, sequela: Secondary | ICD-10-CM

## 2018-10-21 DIAGNOSIS — N319 Neuromuscular dysfunction of bladder, unspecified: Secondary | ICD-10-CM

## 2018-10-21 DIAGNOSIS — G8250 Quadriplegia, unspecified: Secondary | ICD-10-CM

## 2018-10-21 NOTE — Telephone Encounter (Signed)
Called to pharmacy 
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
## 2018-10-22 ENCOUNTER — Emergency Department (HOSPITAL_COMMUNITY): Payer: Medicaid Other

## 2018-10-22 ENCOUNTER — Encounter (HOSPITAL_COMMUNITY): Payer: Self-pay | Admitting: Emergency Medicine

## 2018-10-22 ENCOUNTER — Other Ambulatory Visit: Payer: Self-pay

## 2018-10-22 ENCOUNTER — Emergency Department (HOSPITAL_COMMUNITY)
Admission: EM | Admit: 2018-10-22 | Discharge: 2018-10-22 | Disposition: A | Discharge status: Home / Self Care | Payer: Medicaid Other | Source: Home / Self Care | Attending: Emergency Medicine | Admitting: Emergency Medicine

## 2018-10-22 DIAGNOSIS — R609 Edema, unspecified: Secondary | ICD-10-CM | POA: Diagnosis not present

## 2018-10-22 DIAGNOSIS — F17210 Nicotine dependence, cigarettes, uncomplicated: Secondary | ICD-10-CM | POA: Insufficient documentation

## 2018-10-22 DIAGNOSIS — K5900 Constipation, unspecified: Secondary | ICD-10-CM

## 2018-10-22 DIAGNOSIS — R1084 Generalized abdominal pain: Secondary | ICD-10-CM | POA: Diagnosis not present

## 2018-10-22 DIAGNOSIS — R0602 Shortness of breath: Secondary | ICD-10-CM | POA: Diagnosis not present

## 2018-10-22 DIAGNOSIS — Z79899 Other long term (current) drug therapy: Secondary | ICD-10-CM

## 2018-10-22 DIAGNOSIS — R079 Chest pain, unspecified: Secondary | ICD-10-CM | POA: Diagnosis not present

## 2018-10-22 LAB — CBC WITH DIFFERENTIAL/PLATELET
Abs Immature Granulocytes: 0.04 10*3/uL (ref 0.00–0.07)
Basophils Absolute: 0 10*3/uL (ref 0.0–0.1)
Basophils Relative: 0 %
Eosinophils Absolute: 0 10*3/uL (ref 0.0–0.5)
Eosinophils Relative: 0 %
HCT: 43.1 % (ref 39.0–52.0)
Hemoglobin: 14.4 g/dL (ref 13.0–17.0)
Immature Granulocytes: 0 %
Lymphocytes Relative: 19 %
Lymphs Abs: 1.9 10*3/uL (ref 0.7–4.0)
MCH: 32.5 pg (ref 26.0–34.0)
MCHC: 33.4 g/dL (ref 30.0–36.0)
MCV: 97.3 fL (ref 80.0–100.0)
Monocytes Absolute: 0.8 10*3/uL (ref 0.1–1.0)
Monocytes Relative: 8 %
Neutro Abs: 7.2 10*3/uL (ref 1.7–7.7)
Neutrophils Relative %: 73 %
Platelets: 250 10*3/uL (ref 150–400)
RBC: 4.43 MIL/uL (ref 4.22–5.81)
RDW: 12.4 % (ref 11.5–15.5)
WBC: 10 10*3/uL (ref 4.0–10.5)
nRBC: 0 % (ref 0.0–0.2)

## 2018-10-22 LAB — COMPREHENSIVE METABOLIC PANEL
ALT: 11 U/L (ref 0–44)
AST: 14 U/L — ABNORMAL LOW (ref 15–41)
Albumin: 4.8 g/dL (ref 3.5–5.0)
Alkaline Phosphatase: 54 U/L (ref 38–126)
Anion gap: 10 (ref 5–15)
BUN: 9 mg/dL (ref 6–20)
CO2: 24 mmol/L (ref 22–32)
Calcium: 9.2 mg/dL (ref 8.9–10.3)
Chloride: 102 mmol/L (ref 98–111)
Creatinine, Ser: 0.97 mg/dL (ref 0.61–1.24)
GFR calc Af Amer: >60 mL/min (ref >60)
GFR calc non Af Amer: >60 mL/min (ref >60)
Glucose, Bld: 104 mg/dL — ABNORMAL HIGH (ref 70–99)
Potassium: 3.6 mmol/L (ref 3.5–5.1)
Sodium: 136 mmol/L (ref 135–145)
Total Bilirubin: 0.9 mg/dL (ref 0.3–1.2)
Total Protein: 7.6 g/dL (ref 6.5–8.1)

## 2018-10-22 LAB — LIPASE, BLOOD: Lipase: 23 U/L (ref 11–51)

## 2018-10-22 NOTE — ED Notes (Signed)
Pt discharged from ED; instructions provided; Pt encouraged to return to ED if symptoms worsen and to f/u with PCP; Pt verbalized understanding of all instructions 

## 2018-10-22 NOTE — ED Triage Notes (Signed)
Pt reports constipation since Monday. Pt reports he had a "big chunk" come out early Tuesday morning but had pain in his small intestine. Pt denies abdominal pain currently. Pt reports waking up about 1 hour ago with swelling up to his neck, no swelling noted to pt. Pt denies nausea, fever, cough, sick contact, or recent travel.

## 2018-10-22 NOTE — ED Notes (Signed)
Patient transported to X-ray 
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
## 2018-10-23 ENCOUNTER — Encounter (HOSPITAL_COMMUNITY): Payer: Self-pay

## 2018-10-23 ENCOUNTER — Telehealth: Payer: Self-pay | Admitting: Registered Nurse

## 2018-10-23 ENCOUNTER — Inpatient Hospital Stay (HOSPITAL_COMMUNITY)
Admission: EM | Admit: 2018-10-23 | Discharge: 2018-10-31 | DRG: 917 | Disposition: A | Discharge status: Other Acute Inpatient Hospital | Payer: Medicaid Other | Attending: Internal Medicine | Admitting: Internal Medicine

## 2018-10-23 ENCOUNTER — Other Ambulatory Visit: Payer: Self-pay

## 2018-10-23 DIAGNOSIS — G210 Malignant neuroleptic syndrome: Secondary | ICD-10-CM | POA: Diagnosis not present

## 2018-10-23 DIAGNOSIS — G894 Chronic pain syndrome: Secondary | ICD-10-CM | POA: Diagnosis present

## 2018-10-23 DIAGNOSIS — K5900 Constipation, unspecified: Secondary | ICD-10-CM | POA: Diagnosis not present

## 2018-10-23 DIAGNOSIS — Z79899 Other long term (current) drug therapy: Secondary | ICD-10-CM | POA: Diagnosis not present

## 2018-10-23 DIAGNOSIS — F329 Major depressive disorder, single episode, unspecified: Secondary | ICD-10-CM | POA: Diagnosis not present

## 2018-10-23 DIAGNOSIS — F29 Unspecified psychosis not due to a substance or known physiological condition: Secondary | ICD-10-CM | POA: Diagnosis not present

## 2018-10-23 DIAGNOSIS — R509 Fever, unspecified: Secondary | ICD-10-CM | POA: Diagnosis not present

## 2018-10-23 DIAGNOSIS — Z888 Allergy status to other drugs, medicaments and biological substances status: Secondary | ICD-10-CM

## 2018-10-23 DIAGNOSIS — S14101D Unspecified injury at C1 level of cervical spinal cord, subsequent encounter: Secondary | ICD-10-CM | POA: Diagnosis not present

## 2018-10-23 DIAGNOSIS — T391X2A Poisoning by 4-Aminophenol derivatives, intentional self-harm, initial encounter: Secondary | ICD-10-CM | POA: Diagnosis present

## 2018-10-23 DIAGNOSIS — F17210 Nicotine dependence, cigarettes, uncomplicated: Secondary | ICD-10-CM | POA: Diagnosis present

## 2018-10-23 DIAGNOSIS — R443 Hallucinations, unspecified: Secondary | ICD-10-CM | POA: Diagnosis present

## 2018-10-23 DIAGNOSIS — R451 Restlessness and agitation: Secondary | ICD-10-CM | POA: Diagnosis present

## 2018-10-23 DIAGNOSIS — T40602D Poisoning by unspecified narcotics, intentional self-harm, subsequent encounter: Secondary | ICD-10-CM | POA: Diagnosis not present

## 2018-10-23 DIAGNOSIS — G904 Autonomic dysreflexia: Secondary | ICD-10-CM | POA: Diagnosis present

## 2018-10-23 DIAGNOSIS — F332 Major depressive disorder, recurrent severe without psychotic features: Secondary | ICD-10-CM | POA: Diagnosis not present

## 2018-10-23 DIAGNOSIS — T887XXA Unspecified adverse effect of drug or medicament, initial encounter: Secondary | ICD-10-CM | POA: Diagnosis not present

## 2018-10-23 DIAGNOSIS — M6282 Rhabdomyolysis: Secondary | ICD-10-CM | POA: Diagnosis present

## 2018-10-23 DIAGNOSIS — T391X4A Poisoning by 4-Aminophenol derivatives, undetermined, initial encounter: Secondary | ICD-10-CM | POA: Diagnosis not present

## 2018-10-23 DIAGNOSIS — T40602A Poisoning by unspecified narcotics, intentional self-harm, initial encounter: Secondary | ICD-10-CM | POA: Diagnosis not present

## 2018-10-23 DIAGNOSIS — K592 Neurogenic bowel, not elsewhere classified: Secondary | ICD-10-CM | POA: Diagnosis present

## 2018-10-23 DIAGNOSIS — N319 Neuromuscular dysfunction of bladder, unspecified: Secondary | ICD-10-CM | POA: Diagnosis present

## 2018-10-23 DIAGNOSIS — F419 Anxiety disorder, unspecified: Secondary | ICD-10-CM | POA: Diagnosis present

## 2018-10-23 DIAGNOSIS — R450 Nervousness: Secondary | ICD-10-CM | POA: Diagnosis not present

## 2018-10-23 DIAGNOSIS — F1910 Other psychoactive substance abuse, uncomplicated: Secondary | ICD-10-CM | POA: Diagnosis not present

## 2018-10-23 DIAGNOSIS — F1123 Opioid dependence with withdrawal: Secondary | ICD-10-CM | POA: Diagnosis not present

## 2018-10-23 DIAGNOSIS — S14101A Unspecified injury at C1 level of cervical spinal cord, initial encounter: Secondary | ICD-10-CM | POA: Diagnosis present

## 2018-10-23 DIAGNOSIS — Z880 Allergy status to penicillin: Secondary | ICD-10-CM | POA: Diagnosis not present

## 2018-10-23 DIAGNOSIS — Z720 Tobacco use: Secondary | ICD-10-CM | POA: Diagnosis present

## 2018-10-23 DIAGNOSIS — E876 Hypokalemia: Secondary | ICD-10-CM | POA: Diagnosis present

## 2018-10-23 DIAGNOSIS — R45851 Suicidal ideations: Secondary | ICD-10-CM | POA: Diagnosis not present

## 2018-10-23 DIAGNOSIS — T40601A Poisoning by unspecified narcotics, accidental (unintentional), initial encounter: Secondary | ICD-10-CM | POA: Diagnosis present

## 2018-10-23 DIAGNOSIS — G8250 Quadriplegia, unspecified: Secondary | ICD-10-CM | POA: Diagnosis present

## 2018-10-23 DIAGNOSIS — T402X1A Poisoning by other opioids, accidental (unintentional), initial encounter: Secondary | ICD-10-CM | POA: Diagnosis not present

## 2018-10-23 DIAGNOSIS — Z981 Arthrodesis status: Secondary | ICD-10-CM

## 2018-10-23 DIAGNOSIS — G4709 Other insomnia: Secondary | ICD-10-CM | POA: Diagnosis not present

## 2018-10-23 LAB — CBC WITH DIFFERENTIAL/PLATELET
Abs Immature Granulocytes: 0.03 10*3/uL (ref 0.00–0.07)
Basophils Absolute: 0 10*3/uL (ref 0.0–0.1)
Basophils Relative: 0 %
Eosinophils Absolute: 0 10*3/uL (ref 0.0–0.5)
Eosinophils Relative: 0 %
HCT: 40.1 % (ref 39.0–52.0)
Hemoglobin: 12.8 g/dL — ABNORMAL LOW (ref 13.0–17.0)
Immature Granulocytes: 0 %
Lymphocytes Relative: 17 %
Lymphs Abs: 1.8 10*3/uL (ref 0.7–4.0)
MCH: 31.9 pg (ref 26.0–34.0)
MCHC: 31.9 g/dL (ref 30.0–36.0)
MCV: 100 fL (ref 80.0–100.0)
Monocytes Absolute: 1 10*3/uL (ref 0.1–1.0)
Monocytes Relative: 9 %
Neutro Abs: 7.7 10*3/uL (ref 1.7–7.7)
Neutrophils Relative %: 74 %
Platelets: 227 10*3/uL (ref 150–400)
RBC: 4.01 MIL/uL — ABNORMAL LOW (ref 4.22–5.81)
RDW: 12.4 % (ref 11.5–15.5)
WBC: 10.5 10*3/uL (ref 4.0–10.5)
nRBC: 0 % (ref 0.0–0.2)

## 2018-10-23 LAB — COMPREHENSIVE METABOLIC PANEL
ALT: 11 U/L (ref 0–44)
AST: 14 U/L — ABNORMAL LOW (ref 15–41)
Albumin: 4.7 g/dL (ref 3.5–5.0)
Alkaline Phosphatase: 53 U/L (ref 38–126)
Anion gap: 10 (ref 5–15)
BUN: 19 mg/dL (ref 6–20)
CO2: 22 mmol/L (ref 22–32)
Calcium: 8.5 mg/dL — ABNORMAL LOW (ref 8.9–10.3)
Chloride: 107 mmol/L (ref 98–111)
Creatinine, Ser: 0.92 mg/dL (ref 0.61–1.24)
GFR calc Af Amer: >60 mL/min (ref >60)
GFR calc non Af Amer: >60 mL/min (ref >60)
Glucose, Bld: 120 mg/dL — ABNORMAL HIGH (ref 70–99)
Potassium: 3.1 mmol/L — ABNORMAL LOW (ref 3.5–5.1)
Sodium: 139 mmol/L (ref 135–145)
Total Bilirubin: 0.6 mg/dL (ref 0.3–1.2)
Total Protein: 7.5 g/dL (ref 6.5–8.1)

## 2018-10-23 LAB — SALICYLATE LEVEL: Salicylate Lvl: <7 mg/dL (ref 2.8–30.0)

## 2018-10-23 LAB — RAPID URINE DRUG SCREEN, HOSP PERFORMED
Amphetamines: NOT DETECTED
Barbiturates: NOT DETECTED
Benzodiazepines: NOT DETECTED
Cocaine: NOT DETECTED
Opiates: POSITIVE — AB
Tetrahydrocannabinol: NOT DETECTED

## 2018-10-23 LAB — ACETAMINOPHEN LEVEL: Acetaminophen (Tylenol), Serum: 143 ug/mL — ABNORMAL HIGH (ref 10–30)

## 2018-10-23 LAB — PROTIME-INR
INR: 1.2 (ref 0.8–1.2)
Prothrombin Time: 15.1 seconds (ref 11.4–15.2)

## 2018-10-23 LAB — ETHANOL: Alcohol, Ethyl (B): <10 mg/dL (ref <10)

## 2018-10-23 MED ORDER — POTASSIUM CHLORIDE CRYS ER 20 MEQ PO TBCR
40.0000 meq | EXTENDED_RELEASE_TABLET | Freq: Two times a day (BID) | ORAL | Status: AC
  Administered 2018-10-23 (×2): 40 meq via ORAL
  Filled 2018-10-23 (×2): qty 2

## 2018-10-23 MED ORDER — DEXTROSE 5 % IV SOLN
15.0000 mg/kg/h | INTRAVENOUS | Status: DC
  Administered 2018-10-23: 15 mg/kg/h via INTRAVENOUS
  Filled 2018-10-23 (×2): qty 200

## 2018-10-23 MED ORDER — ACETYLCYSTEINE LOAD VIA INFUSION: 150.0000 mg/kg | Freq: Once | INTRAVENOUS | Status: DC

## 2018-10-23 MED ORDER — NALOXONE HCL 4 MG/0.1ML NA LIQD: NASAL | 1 refills | Status: DC

## 2018-10-23 MED ORDER — CLONAZEPAM 0.5 MG PO TABS
0.5000 mg | ORAL_TABLET | Freq: Three times a day (TID) | ORAL | Status: DC
  Administered 2018-10-23 – 2018-10-24 (×2): 0.5 mg via ORAL
  Filled 2018-10-23 (×2): qty 1

## 2018-10-23 MED ORDER — LACTATED RINGERS IV SOLN
INTRAVENOUS | Status: DC
  Administered 2018-10-23 – 2018-10-26 (×6): via INTRAVENOUS

## 2018-10-23 MED ORDER — LORAZEPAM 2 MG/ML IJ SOLN
0.5000 mg | Freq: Once | INTRAMUSCULAR | Status: AC
  Administered 2018-10-23: 0.5 mg via INTRAVENOUS
  Filled 2018-10-23: qty 1

## 2018-10-23 MED ORDER — ACETYLCYSTEINE LOAD VIA INFUSION
150.0000 mg/kg | Freq: Once | INTRAVENOUS | Status: AC
  Administered 2018-10-23: 10005 mg via INTRAVENOUS
  Filled 2018-10-23: qty 251

## 2018-10-23 NOTE — ED Notes (Addendum)
Spoke with [employer] from [employer]-   Recommend: tx for 24h; repeat labs 1-2 h prior to infusion complete so that if acetylcysteine is needed for another 24h if can be given.
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
## 2018-10-23 NOTE — ED Notes (Signed)
Bed: RESA Expected date:  Expected time:  Means of arrival:  Comments: Overdose SI 

## 2018-10-23 NOTE — Telephone Encounter (Signed)
I have called [facility] and canceled his Rx refill we just did on his clonazepam/w 2 refills.  He had not picked up the new refill called in on 10/21/18. Please advise.

## 2018-10-23 NOTE — H&P (Signed)
History and Physical  Edward HolmesJoseph G Ruybal ZOX:[id number] DOB: 04/01/1981 DOA: 10/23/2018  Referring physician: Dr Lockie Molauratolo PCP[facility]  Outpatient Specialists: Psychiatry and pain Medicine Patient coming from: Home  Chief Complaint: Percocet overdose  HPI: Edward [name] is a 38 y.o. male with medical history significant for depression, spinal cord injury/chronic cervical spine pain on chronic opiates, who [facility] after intentional overdose of Percocet and suicidal attempt.  EMS was called by his mother who lives next door of their duplex apartment because he had an abnormal behavior last night.  Patient admits to intentional overdose of Percocet, states he took 40 pills.  He was in his normal state of health prior to this.  Denies use of alcohol.  Last visit with his psychiatrist was a month ago.  His psych medications have been adjusted frequently.  Patient admits to having a rifle in his apartment.  Admits to suicidal ideation and attempt with overdose.  No homicidal ideation.  No recent sick contact exposure, no recent travel history.  Afebrile with no respiratory symptoms.  ED Course: In the ED, Lab studies remarkable for elevated acetaminophen level 143.  Poison control called and NAC initiated in the ED.  [facility] asked to admit.  Review of Systems: Review of systems as noted in the HPI. All other systems reviewed and are negative.   History reviewed. No pertinent past medical history. Past Surgical History:  Procedure Laterality Date   ANTERIOR CERVICAL DECOMP/DISCECTOMY FUSION N/A 05/05/2016   Procedure: POSTERIOR CERVICAL LAMINECTOMY THREE - FIVE WITH FIXATION AND FUSION;  Surgeon: Loura HaltBenjamin Jared Ditty, MD;  Location: [facility] OR;  Service: Neurosurgery;  Laterality: N/A;    Social History:  reports that he has been smoking. He has been smoking about 1.00 pack per day. He has never used smokeless tobacco. He reports that he does not drink  alcohol or use drugs.   Allergies  Allergen Reactions   Amoxicillin Itching, Rash and Other (See Comments)   Penicillins Itching, Rash and Other (See Comments)    Has patient had a PCN reaction causing immediate rash, facial/tongue/throat swelling, SOB or lightheadedness with hypotension: Yes Has patient had a PCN reaction causing severe rash involving mucus membranes or skin necrosis: No Has patient had a PCN reaction that required hospitalization: No Has patient had a PCN reaction occurring within the last 10 years: No If all of the above answers are "NO", then may proceed with Cephalosporin use. Has patient had a PCN reaction causing immediate rash, facial/tongue/throat swelling, SOB or lightheadedness with hypotension: Yes Has patient had a PCN reaction causing severe rash involving mucus membranes or skin necrosis: No Has patient had a PCN reaction that required hospitalization: No Has patient had a PCN reaction occurring within the last 10 years: No If all of the above answers are "NO", then may proceed with Cephalosporin use.    Clavulanic Acid Other (See Comments)   Urecholine [Bethanechol] Itching    Itching, swelling, sweating (hot flashes)   Ambien [Zolpidem Tartrate] Other (See Comments)    hallucinations   Amoxicillin Rash   Penicillins Rash    No family history on file.  Denies family history of suicidal attempt.  Denies family history of heart disease.  Prior to Admission medications   Medication Sig Start Date End Date Taking? Authorizing Provider  acetaminophen (TYLENOL) 500 MG tablet Take 500-1,000 mg by mouth every 6 (six) hours as needed. pain    [provider]  baclofen (LIORESAL) 20 MG tablet TAKE 1 AND 1/2 TABLET BY MOUTH FOUR TIMES DAILY Patient taking differently: 30 mg 4 (four) times daily. TAKE 1 AND 1/2 TABLET BY MOUTH FOUR TIMES DAILY 10/16/18   Jones Bales, NP  buPROPion (WELLBUTRIN) 75 MG tablet Take 1 tablet (75 mg total) by  mouth 2 (two) times daily. 09/30/18 09/30/19  Ranelle Oyster, MD  clonazePAM (KLONOPIN) 0.5 MG tablet TAKE 1 TABLET BY MOUTH THREE TIMES DAILY Patient taking differently: Take 0.5 mg by mouth 3 (three) times daily.  10/21/18   Jones Bales, NP  diclofenac (VOLTAREN) 75 MG EC tablet TAKE 1 TABLET(75 MG) BY MOUTH TWICE DAILY BEFORE A MEAL Patient taking differently: Take 75 mg by mouth 2 (two) times daily. TAKE 1 TABLET(75 MG) BY MOUTH TWICE DAILY BEFORE A MEAL 06/30/18   Jacalyn Lefevre L, NP  gabapentin (NEURONTIN) 300 MG capsule TAKE 2 CAPSULES BY MOUTH FOUR TIMES DAILY Patient taking differently: Take 600 mg by mouth 4 (four) times daily. TAKE 2 CAPSULES BY MOUTH FOUR TIMES DAILY 10/15/18   Jones Bales, NP  Melatonin 3 MG TABS Take 2 tablets (6 mg total) by mouth at bedtime. 06/14/16   Love, Evlyn Kanner, PA-C  oxyCODONE-acetaminophen (PERCOCET) 7.5-325 MG tablet Take 1 tablet by mouth every 6 (six) hours as needed for moderate pain. 09/30/18   Ranelle Oyster, MD  pantoprazole (PROTONIX) 20 MG tablet TAKE 1 TABLET(20 MG) BY MOUTH AT BEDTIME Patient taking differently: Take 20 mg by mouth daily. TAKE 1 TABLET(20 MG) BY MOUTH AT BEDTIME 07/27/18   Jones Bales, NP  polyethylene glycol (MIRALAX / GLYCOLAX) packet Take 17 g by mouth daily. 01/16/18   Jones Bales, NP  senna-docusate (SENOKOT-S) 8.6-50 MG tablet Take 2 tablets by mouth every other day. 01/10/17   Ranelle Oyster, MD  tiZANidine (ZANAFLEX) 4 MG tablet Take two tablets three x daily. Patient taking differently: Take 4 mg by mouth 3 (three) times daily. Take two tablets three x daily. 09/30/18   Ranelle Oyster, MD    Physical Exam: BP 138/83    Pulse 84    Temp 98.6 F (37 C) (Oral)    Resp 13    SpO2 97%    General: 39 y.o. year-old male well developed well nourished in no acute distress.  Alert and oriented x3.  Cardiovascular: Regular rate and rhythm with no rubs or gallops.  No thyromegaly or JVD noted.  No lower  extremity edema. 2/4 pulses in all 4 extremities.  Respiratory: Clear to auscultation with no wheezes or rales. Good inspiratory effort.  Abdomen: Soft nontender nondistended with normal bowel sounds x4 quadrants.  Muskuloskeletal: No cyanosis, clubbing or edema noted bilaterally  Neuro: CN II-XII intact, strength, sensation, reflexes  Skin: No ulcerative lesions noted or rashes  Psychiatry: Judgement and insight appear normal. Mood is appropriate for condition and setting          Labs on Admission:  Basic Metabolic Panel: Recent Labs  Lab 10/22/18 0309 10/23/18 0701  NA 136 139  K 3.6 3.1*  CL 102 107  CO2 24 22  GLUCOSE 104* 120*  BUN 9 19  CREATININE 0.97 0.92  CALCIUM 9.2 8.5*   Liver Function Tests: Recent Labs  Lab 10/22/18 0309 10/23/18 0701  AST 14* 14*  ALT 11 11  ALKPHOS 54 53  BILITOT 0.9 0.6  PROT 7.6 7.5  ALBUMIN 4.8 4.7   Recent Labs  Lab 10/22/18 0309  LIPASE 23   No results for input(s): AMMONIA in the last 168 hours. CBC: Recent Labs  Lab 10/22/18 0309 10/23/18 0701  WBC 10.0 10.5  NEUTROABS 7.2 7.7  HGB 14.4 12.8*  HCT 43.1 40.1  MCV 97.3 100.0  PLT 250 227   Cardiac Enzymes: No results for input(s): CKTOTAL, CKMB, CKMBINDEX, TROPONINI in the last 168 hours.  BNP (last 3 results) No results for input(s): BNP in the last 8760 hours.  ProBNP (last 3 results) No results for input(s): PROBNP in the last 8760 hours.  CBG: No results for input(s): GLUCAP in the last 168 hours.  Radiological Exams on Admission: Dg Abd Acute 2+v W 1v Chest  Result Date: 10/22/2018 CLINICAL DATA:  Abdominal pain, constipation EXAM: DG ABDOMEN ACUTE W/ 1V CHEST COMPARISON:  None. FINDINGS: Lungs are clear.  No pleural effusion or pneumothorax. The heart is normal in size. Nonobstructive bowel gas pattern. No evidence of free air under the diaphragm on the upright view. Mild left colonic stool burden. Visualized osseous structures are within normal  limits. IMPRESSION: No evidence of acute cardiopulmonary disease. No evidence of small bowel obstruction or free air. Mild left colonic stool burden, within normal limits. Electronically Signed   By: Charline Bills M.D.   On: 10/22/2018 03:05    EKG: I independently viewed the EKG done and my findings are as followed: Sinus tachycardia rate of 113 with no specific ST-T changes.  Assessment/Plan Present on Admission:  Opiate overdose (HCC)  Active Problems:   Opiate overdose (HCC)  Intentional opiate overdose with suicidal attempt Patient on Percocet for chronic cervical spine pain Self reports took about 40 pills of Percocet Acetaminophen level 143 on presentation Poison control called, NAC initiated Pharmacy consulted to manage NAC Closely monitor Acetaminophen level, LFTs, and INR. 1 to 1 sitter ordered for patient safety Consult psych when medically cleared  Chronic depression/anxiety Resume bupropion and Klonopin Follows with psych outpatient  Chronic cervical pain post trauma in 2017 with intentional overdose Follows at pain clinic Hold off home meds for now ED physician contacted his pain medicine provider  Tobacco use disorder Nicotine patch  Risks: High risk for decompensation due to suicidal ideation with attempt and elevated acetaminophen level.  Patient will require at least 2 midnights for further evaluation and treatment of present condition.   DVT prophylaxis: SCDs  Code Status: Full code  Family Communication: None at bedside  Disposition Plan: Admit to telemetry unit  Consults called: None  Admission status: Inpatient status    Darlin Drop MD [facility] Pager [phone]  If 7PM-7AM, please contact night-coverage [email or web address] Password [facility]  10/23/2018, 8:46 AM

## 2018-10-23 NOTE — Telephone Encounter (Signed)
Pt is non-narcotic at this point. He needs to participate in an intensive inpt/outpt behavior and detox program per psychiatry's discretion.

## 2018-10-23 NOTE — Progress Notes (Signed)
Patient having acute confusion/hallucination, which previously was not noted.  Patient's chart reviewed and previous note stating that patient had not filled clonazepam prescription was taken into account.  Dr. Margo Aye notified with order to restart home clonazepam dose.  Pharmacy verified that patient was on med, and for some reason did not fill this month.  Pharmacy tech to call pharmacy to verify details of prescription.

## 2018-10-23 NOTE — Telephone Encounter (Signed)
This Provider received a staff message from Dr. Carmie End ( Emergency Room Physician), regarding Mr. Gosnell ED visit with  possible overdose. Emergency Note was reviewed, PMP was Reviewed. Mr. Conboy Oxycodone 7.5/ 325 mg tablets was filled on 10/21/2018, also this was verified with Pharmacist at [facility]. ED note was reviewed . Per HPI, Mr. Ryman admits to intentional overdose of Percocet and stated he took 40 pills.He has been admitted to [facility].   The above will be discussed with Dr. Riley Kill.

## 2018-10-23 NOTE — Progress Notes (Signed)
Pt is getting more agitated this evening. Stated to sitter he was "about to lose it soon". RN paged on-call physician for anxiety PRN. Waiting for order.

## 2018-10-23 NOTE — ED Notes (Signed)
Pts personal medication sent with pt to floor.

## 2018-10-23 NOTE — Progress Notes (Signed)
Pharmacy - N-Acetylcysteine per Pharmacy for APAP overdose  Assessment: 41 yoM with reported Percocet 7.5/325 overdose this morning around 0200. Based on pill counts, 67 Percocet tabs missing although patient's story is very inconsistent and not showing any signs of opiate overdose. Regardless, APAP level is elevated just above treatment threshold and pharmacy consulted for NAC.   APAP level 143 (treatment threshold ~140 at 5 hrs post-ingestion although ingestion time initially reported as 2 hrs earlier  Baseline INR and LFTs both WNL  Salicylate, EtOH undetectable  UDS pending  No weight entered in Epic; weight provided by [facility] as 66.7 kg  Plan: Spoke with Beth at [facility] on 4/3 at 0835, given concern for possible ingestion of opiates and other CNS agents, IV NAC preferred  NAC 150 mg/kg IV (10g) over 1 hr, followed by  NAC 15 mg/kg/hr IV (1g/hr) thereafter  Recheck CMP and APAP level 22 hrs into treatment; initial LFTs WNL so will hold off on rechecking INR tomorrow UNLESS patient becomes newly symptomatic  Bernadene Person, PharmD, BCPS [phone] 10/23/2018, 8:51 AM
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
## 2018-10-23 NOTE — ED Notes (Addendum)
Pt prescription oxycodone/ acetaminophen 7.5-325 mg pills counted with second RN, Michell Heinrich.  45 pills in container. Pt reports taking as prescribed 2 pills 4/1, 4 pills 4/2, 2 pills 4/3 prior to OD attempt. Denies current SI.

## 2018-10-24 DIAGNOSIS — T40602D Poisoning by unspecified narcotics, intentional self-harm, subsequent encounter: Secondary | ICD-10-CM

## 2018-10-24 DIAGNOSIS — T40602A Poisoning by unspecified narcotics, intentional self-harm, initial encounter: Principal | ICD-10-CM

## 2018-10-24 DIAGNOSIS — R450 Nervousness: Secondary | ICD-10-CM

## 2018-10-24 DIAGNOSIS — Z720 Tobacco use: Secondary | ICD-10-CM

## 2018-10-24 DIAGNOSIS — F329 Major depressive disorder, single episode, unspecified: Secondary | ICD-10-CM

## 2018-10-24 DIAGNOSIS — S14101D Unspecified injury at C1 level of cervical spinal cord, subsequent encounter: Secondary | ICD-10-CM

## 2018-10-24 DIAGNOSIS — G4709 Other insomnia: Secondary | ICD-10-CM

## 2018-10-24 DIAGNOSIS — F1910 Other psychoactive substance abuse, uncomplicated: Secondary | ICD-10-CM

## 2018-10-24 DIAGNOSIS — F17210 Nicotine dependence, cigarettes, uncomplicated: Secondary | ICD-10-CM

## 2018-10-24 DIAGNOSIS — G904 Autonomic dysreflexia: Secondary | ICD-10-CM

## 2018-10-24 DIAGNOSIS — R45851 Suicidal ideations: Secondary | ICD-10-CM

## 2018-10-24 DIAGNOSIS — R509 Fever, unspecified: Secondary | ICD-10-CM

## 2018-10-24 LAB — PROTIME-INR
INR: 1.4 — ABNORMAL HIGH (ref 0.8–1.2)
Prothrombin Time: 16.6 seconds — ABNORMAL HIGH (ref 11.4–15.2)

## 2018-10-24 LAB — COMPREHENSIVE METABOLIC PANEL
ALT: 15 U/L (ref 0–44)
ALT: 16 U/L (ref 0–44)
AST: 19 U/L (ref 15–41)
AST: 22 U/L (ref 15–41)
Albumin: 4.4 g/dL (ref 3.5–5.0)
Albumin: 4.3 g/dL (ref 3.5–5.0)
Alkaline Phosphatase: 52 U/L (ref 38–126)
Alkaline Phosphatase: 48 U/L (ref 38–126)
Anion gap: 14 (ref 5–15)
Anion gap: 12 (ref 5–15)
BUN: 17 mg/dL (ref 6–20)
BUN: 15 mg/dL (ref 6–20)
CO2: 18 mmol/L — ABNORMAL LOW (ref 22–32)
CO2: 17 mmol/L — ABNORMAL LOW (ref 22–32)
Calcium: 9.2 mg/dL (ref 8.9–10.3)
Calcium: 9.1 mg/dL (ref 8.9–10.3)
Chloride: 108 mmol/L (ref 98–111)
Chloride: 109 mmol/L (ref 98–111)
Creatinine, Ser: 0.9 mg/dL (ref 0.61–1.24)
Creatinine, Ser: 0.88 mg/dL (ref 0.61–1.24)
GFR calc Af Amer: >60 mL/min (ref >60)
GFR calc Af Amer: >60 mL/min (ref >60)
GFR calc non Af Amer: >60 mL/min (ref >60)
GFR calc non Af Amer: >60 mL/min (ref >60)
Glucose, Bld: 78 mg/dL (ref 70–99)
Glucose, Bld: 86 mg/dL (ref 70–99)
Potassium: 3.8 mmol/L (ref 3.5–5.1)
Potassium: 4 mmol/L (ref 3.5–5.1)
Sodium: 140 mmol/L (ref 135–145)
Sodium: 138 mmol/L (ref 135–145)
Total Bilirubin: 1.6 mg/dL — ABNORMAL HIGH (ref 0.3–1.2)
Total Bilirubin: 1.3 mg/dL — ABNORMAL HIGH (ref 0.3–1.2)
Total Protein: 7.4 g/dL (ref 6.5–8.1)
Total Protein: 7.4 g/dL (ref 6.5–8.1)

## 2018-10-24 LAB — ACETAMINOPHEN LEVEL: Acetaminophen (Tylenol), Serum: <10 ug/mL — ABNORMAL LOW (ref 10–30)

## 2018-10-24 LAB — CBC
HCT: 42.1 % (ref 39.0–52.0)
Hemoglobin: 13.6 g/dL (ref 13.0–17.0)
MCH: 32.5 pg (ref 26.0–34.0)
MCHC: 32.3 g/dL (ref 30.0–36.0)
MCV: 100.5 fL — ABNORMAL HIGH (ref 80.0–100.0)
Platelets: 212 10*3/uL (ref 150–400)
RBC: 4.19 MIL/uL — ABNORMAL LOW (ref 4.22–5.81)
RDW: 12.6 % (ref 11.5–15.5)
WBC: 12.8 10*3/uL — ABNORMAL HIGH (ref 4.0–10.5)
nRBC: 0 % (ref 0.0–0.2)

## 2018-10-24 MED ORDER — NAPROXEN 250 MG PO TABS
500.0000 mg | ORAL_TABLET | Freq: Two times a day (BID) | ORAL | Status: AC | PRN
  Filled 2018-10-24: qty 2

## 2018-10-24 MED ORDER — DICYCLOMINE HCL 20 MG PO TABS: 20.0000 mg | ORAL_TABLET | Freq: Four times a day (QID) | ORAL | Status: AC | PRN

## 2018-10-24 MED ORDER — HYDROXYZINE HCL 25 MG PO TABS
25.0000 mg | ORAL_TABLET | Freq: Four times a day (QID) | ORAL | Status: AC | PRN
  Filled 2018-10-24: qty 1

## 2018-10-24 MED ORDER — ENOXAPARIN SODIUM 30 MG/0.3ML ~~LOC~~ SOLN
30.0000 mg | SUBCUTANEOUS | Status: DC
  Administered 2018-10-25 – 2018-10-26 (×2): 30 mg via SUBCUTANEOUS
  Filled 2018-10-24 (×2): qty 0.3

## 2018-10-24 MED ORDER — CLONIDINE HCL 0.1 MG PO TABS
0.1000 mg | ORAL_TABLET | Freq: Four times a day (QID) | ORAL | Status: DC
  Administered 2018-10-24 (×3): 0.1 mg via ORAL
  Filled 2018-10-24 (×5): qty 1

## 2018-10-24 MED ORDER — CLONIDINE HCL 0.1 MG PO TABS: 0.1000 mg | ORAL_TABLET | ORAL | Status: DC

## 2018-10-24 MED ORDER — METHOCARBAMOL 500 MG PO TABS
500.0000 mg | ORAL_TABLET | Freq: Three times a day (TID) | ORAL | Status: DC | PRN
  Filled 2018-10-24: qty 1

## 2018-10-24 MED ORDER — LOPERAMIDE HCL 2 MG PO CAPS: 2.0000 mg | ORAL_CAPSULE | ORAL | Status: AC | PRN

## 2018-10-24 MED ORDER — CLONIDINE HCL 0.1 MG PO TABS: 0.1000 mg | ORAL_TABLET | Freq: Every day | ORAL | Status: DC

## 2018-10-24 MED ORDER — HALOPERIDOL LACTATE 5 MG/ML IJ SOLN
2.0000 mg | Freq: Once | INTRAMUSCULAR | Status: AC
  Administered 2018-10-24: 2 mg via INTRAVENOUS
  Filled 2018-10-24: qty 1

## 2018-10-24 MED ORDER — NICOTINE 21 MG/24HR TD PT24
21.0000 mg | MEDICATED_PATCH | Freq: Every day | TRANSDERMAL | Status: DC
  Administered 2018-10-24 – 2018-10-31 (×8): 21 mg via TRANSDERMAL
  Filled 2018-10-24 (×8): qty 1

## 2018-10-24 MED ORDER — SODIUM CHLORIDE 0.9% FLUSH
10.0000 mL | Freq: Two times a day (BID) | INTRAVENOUS | Status: DC
  Administered 2018-10-26 – 2018-10-30 (×3): 10 mL

## 2018-10-24 MED ORDER — CLONAZEPAM 0.5 MG PO TABS
0.5000 mg | ORAL_TABLET | Freq: Three times a day (TID) | ORAL | Status: DC | PRN
  Administered 2018-10-26: 0.5 mg via ORAL
  Filled 2018-10-24: qty 1

## 2018-10-24 MED ORDER — SODIUM CHLORIDE 0.9% FLUSH: 10.0000 mL | INTRAVENOUS | Status: DC | PRN

## 2018-10-24 MED ORDER — ONDANSETRON 4 MG PO TBDP: 4.0000 mg | ORAL_TABLET | Freq: Four times a day (QID) | ORAL | Status: AC | PRN

## 2018-10-24 NOTE — Progress Notes (Signed)
Poison control following up on the patient and requesting vital signs. At this time no new recommendations were given.

## 2018-10-24 NOTE — Progress Notes (Signed)
Rose from poison control following up on the patient. Advised to discontinue the acetylcysteine. RN will continue to monitor the patient.
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
## 2018-10-24 NOTE — Consult Note (Signed)
[facility] Face-to-Face Psychiatry Consult   Reason for Consult: ''drug overdose/suicide attempt'' Referring Physician:  Dr. Janee Morn Patient Identification: Edward [name] MRN:  [id number] Principal Diagnosis: Opiate overdose (HCC) Diagnosis:  Principal Problem:   Opiate overdose (HCC)   Total Time spent with patient: 45 minutes  Subjective:   Edward [name] is a 38 y.o. male patient admitted after he overdosed on Percocet.  HPI: Patient with history of  depression, spinal cord injury/chronic cervical spine pain on chronic opiates, who was admitted to the hospital after he intentional overdose on 40 pills of 7.5/325 mg of Percocet in an attempt to end his life. Today, patient is alert but appears confused and he is combative, agitated, restless, anxious and hallucinating. He is unable to give the sequence of events that lead to his hospitalization. Currently, he seems to be in opiates withdrawal and too combative to continue evaluation.  Past Psychiatric History: as above  Risk to Self:  yes Risk to Others:  denies Prior Inpatient Therapy:  unknown Prior Outpatient Therapy:  has a provider in the community  Past Medical History: History reviewed. No pertinent past medical history.  Past Surgical History:  Procedure Laterality Date  . ANTERIOR CERVICAL DECOMP/DISCECTOMY FUSION N/A 05/05/2016   Procedure: POSTERIOR CERVICAL LAMINECTOMY THREE - FIVE WITH FIXATION AND FUSION;  Surgeon: Loura Halt Ditty, MD;  Location: [facility] OR;  Service: Neurosurgery;  Laterality: N/A;   Family History: No family history on file. Family Psychiatric  History:  Social History:  Social History   Substance and Sexual Activity  Alcohol Use No     Social History   Substance and Sexual Activity  Drug Use No    Social History   Socioeconomic History  . Marital status: Single    Spouse name: Not on file  . Number of children: Not on file  . Years of education: Not on file  . Highest education level:  Not on file  Occupational History  . Not on file  Social Needs  . Financial resource strain: Not on file  . Food insecurity:    Worry: Not on file    Inability: Not on file  . Transportation needs:    Medical: Not on file    Non-medical: Not on file  Tobacco Use  . Smoking status: Current Every Day Smoker    Packs/day: 1.00  . Smokeless tobacco: Never Used  Substance and Sexual Activity  . Alcohol use: No  . Drug use: No  . Sexual activity: Not on file  Lifestyle  . Physical activity:    Days per week: Not on file    Minutes per session: Not on file  . Stress: Not on file  Relationships  . Social connections:    Talks on phone: Not on file    Gets together: Not on file    Attends religious service: Not on file    Active member of club or organization: Not on file    Attends meetings of clubs or organizations: Not on file    Relationship status: Not on file  Other Topics Concern  . Not on file  Social History Narrative   ** Merged History Encounter **       Additional Social History:    Allergies:   Allergies  Allergen Reactions  . Amoxicillin Itching, Rash and Other (See Comments)  . Penicillins Itching, Rash and Other (See Comments)    Has patient had a PCN reaction causing immediate rash, facial/tongue/throat swelling, SOB or lightheadedness  with hypotension: Yes Has patient had a PCN reaction causing severe rash involving mucus membranes or skin necrosis: No Has patient had a PCN reaction that required hospitalization: No Has patient had a PCN reaction occurring within the last 10 years: No If all of the above answers are "NO", then may proceed with Cephalosporin use. Has patient had a PCN reaction causing immediate rash, facial/tongue/throat swelling, SOB or lightheadedness with hypotension: Yes Has patient had a PCN reaction causing severe rash involving mucus membranes or skin necrosis: No Has patient had a PCN reaction that required hospitalization: No Has  patient had a PCN reaction occurring within the last 10 years: No If all of the above answers are "NO", then may proceed with Cephalosporin use.   . Clavulanic Acid Other (See Comments)  . Urecholine [Bethanechol] Itching    Itching, swelling, sweating (hot flashes)  . Ambien [Zolpidem Tartrate] Other (See Comments)    hallucinations  . Amoxicillin Rash  . Penicillins Rash    Labs:  Results for orders placed or performed during the hospital encounter of 10/23/18 (from the past 48 hour(s))  CBC with Differential     Status: Abnormal   Collection Time: 10/23/18  7:01 AM  Result Value Ref Range   WBC 10.5 4.0 - 10.5 K/uL   RBC 4.01 (L) 4.22 - 5.81 MIL/uL   Hemoglobin 12.8 (L) 13.0 - 17.0 g/dL   HCT 56.2 13.0 - 86.5 %   MCV 100.0 80.0 - 100.0 fL   MCH 31.9 26.0 - 34.0 pg   MCHC 31.9 30.0 - 36.0 g/dL   RDW 78.4 69.6 - 29.5 %   Platelets 227 150 - 400 K/uL   nRBC 0.0 0.0 - 0.2 %   Neutrophils Relative % 74 %   Neutro Abs 7.7 1.7 - 7.7 K/uL   Lymphocytes Relative 17 %   Lymphs Abs 1.8 0.7 - 4.0 K/uL   Monocytes Relative 9 %   Monocytes Absolute 1.0 0.1 - 1.0 K/uL   Eosinophils Relative 0 %   Eosinophils Absolute 0.0 0.0 - 0.5 K/uL   Basophils Relative 0 %   Basophils Absolute 0.0 0.0 - 0.1 K/uL   Immature Granulocytes 0 %   Abs Immature Granulocytes 0.03 0.00 - 0.07 K/uL    Comment: Performed at [facility], [address], [place], [place] [address]  Comprehensive metabolic panel     Status: Abnormal   Collection Time: 10/23/18  7:01 AM  Result Value Ref Range   Sodium 139 135 - 145 mmol/L   Potassium 3.1 (L) 3.5 - 5.1 mmol/L   Chloride 107 98 - 111 mmol/L   CO2 22 22 - 32 mmol/L   Glucose, Bld 120 (H) 70 - 99 mg/dL   BUN 19 6 - 20 mg/dL   Creatinine, Ser 2.44 0.61 - 1.24 mg/dL   Calcium 8.5 (L) 8.9 - 10.3 mg/dL   Total Protein 7.5 6.5 - 8.1 g/dL   Albumin 4.7 3.5 - 5.0 g/dL   AST 14 (L) 15 - 41 U/L   ALT 11 0 - 44 U/L   Alkaline Phosphatase 53 38  - 126 U/L   Total Bilirubin 0.6 0.3 - 1.2 mg/dL   GFR calc non Af Amer >60 >60 mL/min   GFR calc Af Amer >60 >60 mL/min   Anion gap 10 5 - 15    Comment: Performed at [facility], [address] [place], [place] [address]  Ethanol     Status:  None   Collection Time: 10/23/18  7:01 AM  Result Value Ref Range   Alcohol, Ethyl (B) <10 <10 mg/dL    Comment: (NOTE) Lowest detectable limit for serum alcohol is 10 mg/dL. For medical purposes only. Performed at [facility], [address] [place], [place] [address]   Salicylate level     Status: None   Collection Time: 10/23/18  7:01 AM  Result Value Ref Range   Salicylate Lvl <7.0 2.8 - 30.0 mg/dL    Comment: Performed at [facility], [address] [place], [place] [address]  Acetaminophen level     Status: Abnormal   Collection Time: 10/23/18  7:01 AM  Result Value Ref Range   Acetaminophen (Tylenol), Serum 143 (H) 10 - 30 ug/mL    Comment: (NOTE) Therapeutic concentrations vary significantly. A range of 10-30 ug/mL  may be an effective concentration for many patients. However, some  are best treated at concentrations outside of this range. Acetaminophen concentrations >150 ug/mL at 4 hours after ingestion  and >50 ug/mL at 12 hours after ingestion are often associated with  toxic reactions. Performed at [facility], [address] [place], [place] [address]   Protime-INR     Status: None   Collection Time: 10/23/18  8:01 AM  Result Value Ref Range   Prothrombin Time 15.1 11.4 - 15.2 seconds   INR 1.2 0.8 - 1.2    Comment: (NOTE) INR goal varies based on device and disease states. Performed at [facility], [address], [place], [place] [address]   Rapid urine drug screen (hospital performed)     Status: Abnormal   Collection Time: 10/23/18  2:27 PM  Result Value Ref Range   Opiates POSITIVE (A) NONE DETECTED   Cocaine  NONE DETECTED NONE DETECTED   Benzodiazepines NONE DETECTED NONE DETECTED   Amphetamines NONE DETECTED NONE DETECTED   Tetrahydrocannabinol NONE DETECTED NONE DETECTED   Barbiturates NONE DETECTED NONE DETECTED    Comment: (NOTE) DRUG SCREEN FOR MEDICAL PURPOSES ONLY.  IF CONFIRMATION IS NEEDED FOR ANY PURPOSE, NOTIFY LAB WITHIN 5 DAYS. LOWEST DETECTABLE LIMITS FOR URINE DRUG SCREEN Drug Class                     Cutoff (ng/mL) Amphetamine and metabolites    1000 Barbiturate and metabolites    200 Benzodiazepine                 200 Tricyclics and metabolites     300 Opiates and metabolites        300 Cocaine and metabolites        300 THC                            50 Performed at [facility], [address], [place], [place] [address]   CBC     Status: Abnormal   Collection Time: 10/24/18  3:06 AM  Result Value Ref Range   WBC 12.8 (H) 4.0 - 10.5 K/uL   RBC 4.19 (L) 4.22 - 5.81 MIL/uL   Hemoglobin 13.6 13.0 - 17.0 g/dL   HCT 44.9 20.1 - 00.7 %   MCV 100.5 (H) 80.0 - 100.0 fL   MCH 32.5 26.0 - 34.0 pg   MCHC 32.3 30.0 - 36.0 g/dL   RDW 12.1 97.5 - 88.3 %   Platelets 212 150 - 400 K/uL  nRBC 0.0 0.0 - 0.2 %    Comment: Performed at [facility], [address], [place], [place] [address]  Comprehensive metabolic panel     Status: Abnormal   Collection Time: 10/24/18  3:06 AM  Result Value Ref Range   Sodium 140 135 - 145 mmol/L   Potassium 3.8 3.5 - 5.1 mmol/L    Comment: DELTA CHECK NOTED   Chloride 108 98 - 111 mmol/L   CO2 18 (L) 22 - 32 mmol/L   Glucose, Bld 78 70 - 99 mg/dL   BUN 17 6 - 20 mg/dL   Creatinine, Ser 1.91 0.61 - 1.24 mg/dL   Calcium 9.2 8.9 - 47.8 mg/dL   Total Protein 7.4 6.5 - 8.1 g/dL   Albumin 4.4 3.5 - 5.0 g/dL   AST 19 15 - 41 U/L   ALT 15 0 - 44 U/L   Alkaline Phosphatase 52 38 - 126 U/L   Total Bilirubin 1.6 (H) 0.3 - 1.2 mg/dL   GFR calc non Af Amer >60 >60 mL/min   GFR calc Af Amer >60 >60  mL/min   Anion gap 14 5 - 15    Comment: Performed at [facility], [address] [place], [place] [address]  Comprehensive metabolic panel     Status: Abnormal   Collection Time: 10/24/18  7:08 AM  Result Value Ref Range   Sodium 138 135 - 145 mmol/L   Potassium 4.0 3.5 - 5.1 mmol/L   Chloride 109 98 - 111 mmol/L   CO2 17 (L) 22 - 32 mmol/L   Glucose, Bld 86 70 - 99 mg/dL   BUN 15 6 - 20 mg/dL   Creatinine, Ser 1.30 0.61 - 1.24 mg/dL   Calcium 9.1 8.9 - 86.5 mg/dL   Total Protein 7.4 6.5 - 8.1 g/dL   Albumin 4.3 3.5 - 5.0 g/dL   AST 22 15 - 41 U/L   ALT 16 0 - 44 U/L   Alkaline Phosphatase 48 38 - 126 U/L   Total Bilirubin 1.3 (H) 0.3 - 1.2 mg/dL   GFR calc non Af Amer >60 >60 mL/min   GFR calc Af Amer >60 >60 mL/min   Anion gap 12 5 - 15    Comment: Performed at [facility], [address] [place], [place] [address]  Protime-INR     Status: Abnormal   Collection Time: 10/24/18  7:08 AM  Result Value Ref Range   Prothrombin Time 16.6 (H) 11.4 - 15.2 seconds   INR 1.4 (H) 0.8 - 1.2    Comment: (NOTE) INR goal varies based on device and disease states. Performed at [facility], [address] [place], [place] [address]   Acetaminophen level     Status: Abnormal   Collection Time: 10/24/18  7:08 AM  Result Value Ref Range   Acetaminophen (Tylenol), Serum <10 (L) 10 - 30 ug/mL    Comment: (NOTE) Therapeutic concentrations vary significantly. A range of 10-30 ug/mL  may be an effective concentration for many patients. However, some  are best treated at concentrations outside of this range. Acetaminophen concentrations >150 ug/mL at 4 hours after ingestion  and >50 ug/mL at 12 hours after ingestion are often associated with  toxic reactions. Performed at [facility], [address], [place], [place] [address]     Current Facility-Administered Medications  Medication Dose Route Frequency  Provider Last Rate Last Dose  . clonazePAM (KLONOPIN) tablet 0.5  mg  0.5 mg Oral TID PRN Saleemah Mollenhauer, MD      . cloNIDine (CATAPRES) tablet 0.1 mg  0.1 mg Oral QID Thedore Mins, MD       Followed by  . [START ON 10/26/2018] cloNIDine (CATAPRES) tablet 0.1 mg  0.1 mg Oral BH-qamhs Nyssa Sayegh, MD       Followed by  . [START ON 10/28/2018] cloNIDine (CATAPRES) tablet 0.1 mg  0.1 mg Oral QAC breakfast Xxavier Noon, MD      . dicyclomine (BENTYL) tablet 20 mg  20 mg Oral Q6H PRN Honora Searson, MD      . hydrOXYzine (ATARAX/VISTARIL) tablet 25 mg  25 mg Oral Q6H PRN Kaiyu Mirabal, MD      . lactated ringers infusion   Intravenous Continuous Dow Adolph N, DO 100 mL/hr at 10/23/18 1240    . loperamide (IMODIUM) capsule 2-4 mg  2-4 mg Oral PRN Shahana Capes, MD      . methocarbamol (ROBAXIN) tablet 500 mg  500 mg Oral Q8H PRN Tavyn Kurka, MD      . naproxen (NAPROSYN) tablet 500 mg  500 mg Oral BID PRN Alivya Wegman, MD      . ondansetron (ZOFRAN-ODT) disintegrating tablet 4 mg  4 mg Oral Q6H PRN Frankie Scipio, MD      . sodium chloride flush (NS) 0.9 % injection 10-40 mL  10-40 mL Intracatheter Q12H Hall, Carole N, DO      . sodium chloride flush (NS) 0.9 % injection 10-40 mL  10-40 mL Intracatheter PRN Darlin Drop, DO        Musculoskeletal: Strength & Muscle Tone: not tested Gait & Station: not tested Patient leans: N/A  Psychiatric Specialty Exam: Physical Exam  Psychiatric: His mood appears anxious. His affect is labile. His speech is rapid and/or pressured and slurred. He is agitated, actively hallucinating and combative. Cognition and memory are normal. He expresses impulsivity. He expresses suicidal ideation. He expresses suicidal plans.    Review of Systems  Constitutional: Positive for malaise/fatigue.  HENT: Negative.   Eyes: Negative.   Respiratory: Negative.   Cardiovascular: Negative.   Gastrointestinal: Positive for abdominal pain and  nausea.  Genitourinary: Negative.   Musculoskeletal: Positive for back pain.  Skin: Negative.   Neurological: Positive for tremors.  Endo/Heme/Allergies: Negative.   Psychiatric/Behavioral: Positive for hallucinations and suicidal ideas. The patient is nervous/anxious and has insomnia.     Blood pressure (!) 140/92, pulse 93, temperature 99 F (37.2 C), temperature source Axillary, resp. rate 18, SpO2 96 %.There is no height or weight on file to calculate BMI.  General Appearance: Casual  Eye Contact:  Minimal  Speech:  Pressured, Slow and Slurred  Volume:  Increased  Mood:  agitated  Affect:  Restricted  Thought Process:  Disorganized  Orientation:  Other:  disoriented  Thought Content:  Illogical  Suicidal Thoughts:  Yes.  with intent/plan  Homicidal Thoughts:  No  Memory:  unable to assess  Judgement:  Poor  Insight:  Shallow  Psychomotor Activity:  Increased and Restlessness  Concentration:  Concentration: Poor and Attention Span: Poor  Recall:  [employer] of Knowledge:  unable to assess  Language:  Fair  Akathisia:  No  Handed:  Right  AIMS (if indicated):     Assets:  Social Support  ADL's:  Intact  Cognition:  WNL  Sleep:   poor     Treatment Plan Summary: 38 year old man with history of depression and chronic  pain who overdosed on Percocet in a suicide attempt. He is combative, agitated, anxious, restless, tremulous and seems to be withdrawing from Opiates.  Recommendations: -Continue 1:1 sitter for safety. -Will start Opiates withdrawal protocol. -Consider Haldol 0.5 mg PO/IM q6 prn for agitation/Hallucinations. -Change Clonazepam to 0.5 mg tid prn for anxiety/agitation. -Patient will benefit from psychiatric inpatient admission after he is medically cleared. -Consider placing patient on IVC if he refused Voluntary admission. -Psychiatric service signing out, re-consult as needed.  Disposition: Recommend psychiatric Inpatient admission when medically  cleared.  Thedore Mins, MD 10/24/2018 10:39 AM

## 2018-10-25 ENCOUNTER — Inpatient Hospital Stay (HOSPITAL_COMMUNITY): Payer: Medicaid Other

## 2018-10-25 DIAGNOSIS — R509 Fever, unspecified: Secondary | ICD-10-CM

## 2018-10-25 LAB — COMPREHENSIVE METABOLIC PANEL
ALT: 19 U/L (ref 0–44)
AST: 37 U/L (ref 15–41)
Albumin: 4.8 g/dL (ref 3.5–5.0)
Alkaline Phosphatase: 54 U/L (ref 38–126)
Anion gap: 13 (ref 5–15)
BUN: 23 mg/dL — ABNORMAL HIGH (ref 6–20)
CO2: 18 mmol/L — ABNORMAL LOW (ref 22–32)
Calcium: 9.7 mg/dL (ref 8.9–10.3)
Chloride: 112 mmol/L — ABNORMAL HIGH (ref 98–111)
Creatinine, Ser: 0.75 mg/dL (ref 0.61–1.24)
GFR calc Af Amer: >60 mL/min (ref >60)
GFR calc non Af Amer: >60 mL/min (ref >60)
Glucose, Bld: 84 mg/dL (ref 70–99)
Potassium: 3.6 mmol/L (ref 3.5–5.1)
Sodium: 143 mmol/L (ref 135–145)
Total Bilirubin: 1.5 mg/dL — ABNORMAL HIGH (ref 0.3–1.2)
Total Protein: 7.9 g/dL (ref 6.5–8.1)

## 2018-10-25 LAB — URINALYSIS, ROUTINE W REFLEX MICROSCOPIC
Bacteria, UA: NONE SEEN
Bilirubin Urine: NEGATIVE
Glucose, UA: NEGATIVE mg/dL
Ketones, ur: 80 mg/dL — AB
Leukocytes,Ua: NEGATIVE
Nitrite: NEGATIVE
Protein, ur: 30 mg/dL — AB
RBC / HPF: >50 RBC/hpf — ABNORMAL HIGH (ref 0–5)
Specific Gravity, Urine: 1.026 (ref 1.005–1.030)
pH: 5 (ref 5.0–8.0)

## 2018-10-25 LAB — PROTIME-INR
INR: 1.2 (ref 0.8–1.2)
Prothrombin Time: 15.5 seconds — ABNORMAL HIGH (ref 11.4–15.2)

## 2018-10-25 LAB — CBC
HCT: 41.1 % (ref 39.0–52.0)
Hemoglobin: 13.7 g/dL (ref 13.0–17.0)
MCH: 32.6 pg (ref 26.0–34.0)
MCHC: 33.3 g/dL (ref 30.0–36.0)
MCV: 97.9 fL (ref 80.0–100.0)
Platelets: 217 10*3/uL (ref 150–400)
RBC: 4.2 MIL/uL — ABNORMAL LOW (ref 4.22–5.81)
RDW: 12.6 % (ref 11.5–15.5)
WBC: 11.2 10*3/uL — ABNORMAL HIGH (ref 4.0–10.5)
nRBC: 0 % (ref 0.0–0.2)

## 2018-10-25 MED ORDER — HALOPERIDOL LACTATE 5 MG/ML IJ SOLN: 0.5000 mg | Freq: Four times a day (QID) | INTRAMUSCULAR | Status: DC | PRN

## 2018-10-25 MED ORDER — CLONIDINE ORAL SUSPENSION 10 MCG/ML
0.1000 mg | Freq: Every day | ORAL | Status: AC
  Administered 2018-10-29 – 2018-10-30 (×2): 0.1 mg via ORAL
  Filled 2018-10-25 (×2): qty 10

## 2018-10-25 MED ORDER — HALOPERIDOL LACTATE 5 MG/ML IJ SOLN: 1.0000 mg | Freq: Four times a day (QID) | INTRAMUSCULAR | Status: DC | PRN

## 2018-10-25 MED ORDER — ACETAMINOPHEN 650 MG RE SUPP
325.0000 mg | Freq: Once | RECTAL | Status: AC
  Administered 2018-10-25: 325 mg via RECTAL
  Filled 2018-10-25: qty 1

## 2018-10-25 MED ORDER — POTASSIUM CHLORIDE CRYS ER 20 MEQ PO TBCR
40.0000 meq | EXTENDED_RELEASE_TABLET | Freq: Once | ORAL | Status: DC
  Filled 2018-10-25: qty 2

## 2018-10-25 MED ORDER — CLONIDINE ORAL SUSPENSION 10 MCG/ML
0.1000 mg | Freq: Four times a day (QID) | ORAL | Status: AC
  Administered 2018-10-25 – 2018-10-26 (×5): 0.1 mg via ORAL
  Filled 2018-10-25 (×8): qty 10

## 2018-10-25 MED ORDER — CLONIDINE ORAL SUSPENSION 10 MCG/ML
0.1000 mg | Freq: Two times a day (BID) | ORAL | Status: AC
  Administered 2018-10-25 – 2018-10-28 (×4): 0.1 mg via ORAL
  Filled 2018-10-25 (×3): qty 10

## 2018-10-25 MED ORDER — BISACODYL 10 MG RE SUPP
10.0000 mg | Freq: Once | RECTAL | Status: AC
  Administered 2018-10-25: 10 mg via RECTAL
  Filled 2018-10-25: qty 1

## 2018-10-25 MED ORDER — LACTATED RINGERS IV BOLUS
500.0000 mL | Freq: Once | INTRAVENOUS | Status: AC
  Administered 2018-10-25: 500 mL via INTRAVENOUS

## 2018-10-25 NOTE — Progress Notes (Signed)
PROGRESS NOTE    Edward [name]  CWC:[id number] DOB: [birth year] DOA: 10/23/2018 PCP: Medicine, [facility]   Brief Narrative:  HPI per Dr. Unk [name] is a 38 y.o. male with medical history significant for depression, spinal cord injury/chronic cervical spine pain on chronic opiates, who presented to [facility] ED via EMS after intentional overdose of Percocet and suicidal attempt.  EMS was called by his mother who lives next door of their duplex apartment because he had an abnormal behavior last night.  Patient admits to intentional overdose of Percocet, states he took 40 pills.  He was in his normal state of health prior to this.  Denies use of alcohol.  Last visit with his psychiatrist was a month ago.  His psych medications have been adjusted frequently.  Patient admits to having a rifle in his apartment.  Admits to suicidal ideation and attempt with overdose.  No homicidal ideation.  No recent sick contact exposure, no recent travel history.  Afebrile with no respiratory symptoms.  ED Course: In the ED, Lab studies remarkable for elevated acetaminophen level 143.  Poison control called and NAC initiated in the ED.  [facility] asked to admit.    Assessment & Plan:   Principal Problem:   Opiate overdose (HCC) Active Problems:   Tobacco abuse   Neuropathic pain   Spinal cord injury at C1-C4 level (HCC)   Spastic tetraplegia (HCC)   Neurogenic bladder   Neurogenic bowel   Autonomic dysreflexia   Reactive depression   Suicidal ideation  1 intentional opiate overdose with suicide attempt/suicidal ideation Patient with confusion and agitation.  Patient noted to have an elevated acetaminophen level of 143 on presentation.  Patient noted to be on Percocet for chronic cervical spine pain and reported taking 40 pills of Percocet.  Patient was placed on Mucomyst/N- acetylcysteine on admission.  Poison control called and Mucomyst/N-acetylcysteine was initiated.  LFTs within  normal limits.  INR at 1.4 today.  Acetaminophen level was less than 10.  Poison control contacted pharmacy and recommendations were to discontinue Mucomyst.  Mucomyst was discontinued on 10/24/2018.  Patient with hallucinations and agitation.  Patient refusing medications and spitting them out.  Patient placed on the clonidine detox protocol per psychiatry due to concerns for withdrawal.  LFTs within normal limits.  INR within normal limits.  Continue one-to-one sitter.  Supportive care.  Patient seen by psychiatry who are recommending inpatient admission when patient medically cleared.  Per psychiatry if patient refuses voluntary admission will need to be IVC.  Appreciate psychiatry input and recommendations.  2.  Opiate withdrawal Patient noted to be combative, agitated, anxious, restless.  Patient also noted to be hallucinating.  Patient refusing to take medications.  Patient placed on the clonidine withdrawal protocol.  Placed on Haldol 1 mg p.o./IM every 6 hours as needed agitation hallucinations.  Clonazepam has been changed to as needed per psychiatry.  Supportive care.  Follow.  3.  Chronic depression/anxiety Patient seen by psychiatry.  Recommendation was to continue clonazepam as needed.  Haldol as needed agitation/hallucinations.   4.  Chronic cervical pain post trauma in 2017 with intentional overdose Patient follows up with the pain clinic.  Pain medications currently on hold due to acute overdose.  Per epic patient's pain doctor notified of admission.  Follow.  5.  Tobacco abuse Continue nicotine patch.  6.  Fever Patient noted to have fever this morning.  Check a chest x-ray.  Check a UA with cultures and  sensitivities.  Check blood cultures x2.  Follow for now.    DVT prophylaxis: Lovenox. Code Status: Full Family Communication: Updated patient.  No family at bedside. Disposition Plan: Pending psychiatric evaluation.  Likely inpatient psychiatry once medically stable.    Consultants:   Psychiatry: Dr. Jannifer Franklin 10/24/2018  Procedures:   Acute abdominal series for 10/22/2018  Antimicrobials:   None   Subjective: Patient sitting up in bed.  Agitated.  Confused.  In mittens.  Per RN patient spitting out medications and refusing to take any medications or food.  Patient with hallucinations per sitter and RN.  Objective: Vitals:   10/24/18 1029 10/24/18 1420 10/24/18 2252 10/25/18 0702  BP: (!) 140/92 (!) 146/91 (!) 140/94 120/89  Pulse:   (!) 101 (!) 102  Resp: Temp:   98 F (36.7 C) 98.8 F (37.1 C)  TempSrc:   Oral Oral  SpO2:   99% 97%    Intake/Output Summary (Last 24 hours) at 10/25/2018 1101 Last data filed at 10/25/2018 0500 Gross per 24 hour  Intake 240 ml  Output 1750 ml  Net -1510 ml   There were no vitals filed for this visit.  Examination:  General exam: Agitated.  Confused.  In mittens. Respiratory system: CTAB.  No wheezes, no crackles, no rhonchi.  Normal respiratory effort.  Cardiovascular system: Tachycardic.  No JVD, no murmurs, no rubs, no gallops.  No lower extremity edema.  Gastrointestinal system: Abdomen is soft, nontender, nondistended, positive bowel sounds.  No rebound.  No guarding.  Central nervous system: Alert.  Confused.  Somewhat agitated.  Moving extremities spontaneously.  No focal neurological deficits.  Extremities: Symmetric 5 x 5 power.  Patient shaking in lower extremities. Skin: No rashes, lesions or ulcers Psychiatry: Judgement and insight appear poor. Mood & affect appropriate.     Data Reviewed: I have personally reviewed following labs and imaging studies  CBC: Recent Labs  Lab 10/22/18 0309 10/23/18 0701 10/24/18 0306 10/25/18 0322  WBC 10.0 10.5 12.8* 11.2*  NEUTROABS 7.2 7.7  --   --   HGB 14.4 12.8* 13.6 13.7  HCT 43.1 40.1 42.1 41.1  MCV 97.3 100.0 100.5* 97.9  PLT 250 227 212 217   Basic Metabolic Panel: Recent Labs  Lab 10/22/18 0309 10/23/18 0701 10/24/18 0306  10/24/18 0708 10/25/18 0322  NA 136 139 140 138 143  K 3.6 3.1* 3.8 4.0 3.6  CL 102 107 108 109 112*  CO2 24 22 18* 17* 18*  GLUCOSE 104* 120* 78 86 84  BUN 23*  CREATININE 0.97 0.92 0.90 0.88 0.75  CALCIUM 9.2 8.5* 9.2 9.1 9.7   GFR: CrCl cannot be calculated (Unknown ideal weight.). Liver Function Tests: Recent Labs  Lab 10/22/18 0309 10/23/18 0701 10/24/18 0306 10/24/18 0708 10/25/18 0322  AST 14* 14* 19 22 37  ALT ALKPHOS 54 53 52 48 54  BILITOT 0.9 0.6 1.6* 1.3* 1.5*  PROT 7.6 7.5 7.4 7.4 7.9  ALBUMIN 4.8 4.7 4.4 4.3 4.8   Recent Labs  Lab 10/22/18 0309  LIPASE 23   No results for input(s): AMMONIA in the last 168 hours. Coagulation Profile: Recent Labs  Lab 10/23/18 0801 10/24/18 0708 10/25/18 0322  INR 1.2 1.4* 1.2   Cardiac Enzymes: No results for input(s): CKTOTAL, CKMB, CKMBINDEX, TROPONINI in the last 168 hours. BNP (last 3 results) No results for input(s): PROBNP in the last 8760 hours. HbA1C: No  results for input(s): HGBA1C in the last 72 hours. CBG: No results for input(s): GLUCAP in the last 168 hours. Lipid Profile: No results for input(s): CHOL, HDL, LDLCALC, TRIG, CHOLHDL, LDLDIRECT in the last 72 hours. Thyroid Function Tests: No results for input(s): TSH, T4TOTAL, FREET4, T3FREE, THYROIDAB in the last 72 hours. Anemia Panel: No results for input(s): VITAMINB12, FOLATE, FERRITIN, TIBC, IRON, RETICCTPCT in the last 72 hours. Sepsis Labs: No results for input(s): PROCALCITON, LATICACIDVEN in the last 168 hours.  No results found for this or any previous visit (from the past 240 hour(s)).       Radiology Studies: No results found.      Scheduled Meds: . bisacodyl  10 mg Rectal Once  . cloNIDine  0.1 mg Oral QID   Followed by  . [START ON 10/27/2018] cloNIDine  0.1 mg Oral BID   Followed by  . [START ON 10/29/2018] cloNIDine  0.1 mg Oral Daily  . enoxaparin (LOVENOX) injection  30 mg Subcutaneous  Q24H  . nicotine  21 mg Transdermal Daily  . potassium chloride  40 mEq Oral Once  . sodium chloride flush  10-40 mL Intracatheter Q12H   Continuous Infusions: . lactated ringers 100 mL/hr at 10/25/18 0650     LOS: 2 days    Time spent: 35 minutes    Ramiro Harvest, MD [facility]  If 7PM-7AM, please contact night-coverage [email or web address] 10/25/2018, 11:01 AM

## 2018-10-26 LAB — CBC WITH DIFFERENTIAL/PLATELET
Abs Immature Granulocytes: 0.1 10*3/uL — ABNORMAL HIGH (ref 0.00–0.07)
Basophils Absolute: 0 10*3/uL (ref 0.0–0.1)
Basophils Relative: 0 %
Eosinophils Absolute: 0 10*3/uL (ref 0.0–0.5)
Eosinophils Relative: 0 %
HCT: 39.8 % (ref 39.0–52.0)
Hemoglobin: 12.7 g/dL — ABNORMAL LOW (ref 13.0–17.0)
Immature Granulocytes: 1 %
Lymphocytes Relative: 10 %
Lymphs Abs: 0.9 10*3/uL (ref 0.7–4.0)
MCH: 31.9 pg (ref 26.0–34.0)
MCHC: 31.9 g/dL (ref 30.0–36.0)
MCV: 100 fL (ref 80.0–100.0)
Monocytes Absolute: 0.7 10*3/uL (ref 0.1–1.0)
Monocytes Relative: 8 %
Neutro Abs: 7.7 10*3/uL (ref 1.7–7.7)
Neutrophils Relative %: 81 %
Platelets: 215 10*3/uL (ref 150–400)
RBC: 3.98 MIL/uL — ABNORMAL LOW (ref 4.22–5.81)
RDW: 12.7 % (ref 11.5–15.5)
WBC: 9.5 10*3/uL (ref 4.0–10.5)
nRBC: 0 % (ref 0.0–0.2)

## 2018-10-26 LAB — COMPREHENSIVE METABOLIC PANEL
ALT: 23 U/L (ref 0–44)
AST: 34 U/L (ref 15–41)
Albumin: 4.4 g/dL (ref 3.5–5.0)
Alkaline Phosphatase: 48 U/L (ref 38–126)
Anion gap: 15 (ref 5–15)
BUN: 25 mg/dL — ABNORMAL HIGH (ref 6–20)
CO2: 17 mmol/L — ABNORMAL LOW (ref 22–32)
Calcium: 9.3 mg/dL (ref 8.9–10.3)
Chloride: 112 mmol/L — ABNORMAL HIGH (ref 98–111)
Creatinine, Ser: 0.78 mg/dL (ref 0.61–1.24)
GFR calc Af Amer: >60 mL/min (ref >60)
GFR calc non Af Amer: >60 mL/min (ref >60)
Glucose, Bld: 90 mg/dL (ref 70–99)
Potassium: 3.5 mmol/L (ref 3.5–5.1)
Sodium: 144 mmol/L (ref 135–145)
Total Bilirubin: 1.7 mg/dL — ABNORMAL HIGH (ref 0.3–1.2)
Total Protein: 7.4 g/dL (ref 6.5–8.1)

## 2018-10-26 LAB — URINE CULTURE: Culture: NO GROWTH

## 2018-10-26 LAB — MAGNESIUM: Magnesium: 2.3 mg/dL (ref 1.7–2.4)

## 2018-10-26 LAB — PROTIME-INR
INR: 1.2 (ref 0.8–1.2)
Prothrombin Time: 15.4 seconds — ABNORMAL HIGH (ref 11.4–15.2)

## 2018-10-26 LAB — CK: Total CK: 1015 U/L — ABNORMAL HIGH (ref 49–397)

## 2018-10-26 MED ORDER — ACETAMINOPHEN 650 MG RE SUPP: 325.0000 mg | RECTAL | Status: DC | PRN

## 2018-10-26 MED ORDER — ACETAMINOPHEN 650 MG RE SUPP
325.0000 mg | Freq: Once | RECTAL | Status: AC
  Administered 2018-10-26: 325 mg via RECTAL
  Filled 2018-10-26: qty 1

## 2018-10-26 MED ORDER — PANTOPRAZOLE SODIUM 40 MG IV SOLR
40.0000 mg | INTRAVENOUS | Status: DC
  Administered 2018-10-26 – 2018-10-28 (×3): 40 mg via INTRAVENOUS
  Filled 2018-10-26 (×4): qty 40

## 2018-10-26 MED ORDER — BACLOFEN 20 MG PO TABS
30.0000 mg | ORAL_TABLET | Freq: Four times a day (QID) | ORAL | Status: DC
  Administered 2018-10-26 – 2018-10-29 (×12): 30 mg via ORAL
  Filled 2018-10-26 (×12): qty 1

## 2018-10-26 MED ORDER — SENNOSIDES-DOCUSATE SODIUM 8.6-50 MG PO TABS
2.0000 | ORAL_TABLET | ORAL | Status: DC
  Administered 2018-10-28 – 2018-10-30 (×2): 2 via ORAL
  Filled 2018-10-26 (×2): qty 2

## 2018-10-26 MED ORDER — SODIUM CHLORIDE 0.9 % IV BOLUS
1000.0000 mL | Freq: Once | INTRAVENOUS | Status: AC
  Administered 2018-10-26: 1000 mL via INTRAVENOUS

## 2018-10-26 MED ORDER — TIZANIDINE HCL 4 MG PO TABS
4.0000 mg | ORAL_TABLET | Freq: Three times a day (TID) | ORAL | Status: DC
  Administered 2018-10-26 (×2): 4 mg via ORAL
  Filled 2018-10-26 (×2): qty 1

## 2018-10-26 MED ORDER — KCL IN DEXTROSE-NACL 40-5-0.9 MEQ/L-%-% IV SOLN
INTRAVENOUS | Status: DC
  Administered 2018-10-26 – 2018-10-27 (×3): via INTRAVENOUS
  Filled 2018-10-26 (×3): qty 1000

## 2018-10-26 MED ORDER — GABAPENTIN 300 MG PO CAPS
600.0000 mg | ORAL_CAPSULE | Freq: Four times a day (QID) | ORAL | Status: DC
  Administered 2018-10-26 – 2018-10-31 (×16): 600 mg via ORAL
  Filled 2018-10-26 (×18): qty 2

## 2018-10-26 MED ORDER — SODIUM CHLORIDE 0.9 % IV SOLN
INTRAVENOUS | Status: DC
  Administered 2018-10-26: 11:00:00 via INTRAVENOUS

## 2018-10-26 MED ORDER — EMPTY CONTAINERS FLEXIBLE MISC
70.0000 mg | Freq: Once | Status: AC
  Administered 2018-10-26: 70 mg via INTRAVENOUS
  Filled 2018-10-26: qty 210

## 2018-10-26 MED ORDER — POTASSIUM CHLORIDE 10 MEQ/100ML IV SOLN
10.0000 meq | INTRAVENOUS | Status: AC
  Administered 2018-10-26 (×2): 10 meq via INTRAVENOUS
  Filled 2018-10-26 (×2): qty 100

## 2018-10-26 MED ORDER — LORAZEPAM 2 MG/ML IJ SOLN
0.5000 mg | Freq: Four times a day (QID) | INTRAMUSCULAR | Status: DC
  Administered 2018-10-26: 0.5 mg via INTRAVENOUS
  Filled 2018-10-26: qty 1

## 2018-10-26 MED ORDER — LORAZEPAM 2 MG/ML IJ SOLN
1.0000 mg | INTRAMUSCULAR | Status: DC
  Administered 2018-10-26 – 2018-10-29 (×15): 1 mg via INTRAVENOUS
  Filled 2018-10-26 (×15): qty 1

## 2018-10-26 MED ORDER — POLYETHYLENE GLYCOL 3350 17 G PO PACK: 17.0000 g | PACK | Freq: Every day | ORAL | Status: DC | PRN

## 2018-10-26 NOTE — Consult Note (Signed)
Requesting Physician: Dr. Janee Morn    Chief Complaint:  Altered mental status   History obtained from: Patient and Chart    HPI:                                                                                                                                       Edward [name] is an 38 y.o. male with past medical history significant for depression, spinal cord injury chronic opiates presented to [place] [facility] after intentional overdose of Percocet.  Was placed on Mucomyst after elevated acetaminophen level.  Patient started to have hallucinations while in the hospital and psychiatry was consulted who recommended a clonidine toxic protocol for opiate withdrawal.  He also was started on Haldol as needed for agitation.   He has subsequently became hyperthermic and has been having increased muscle rigidity.  He will also was on multiple muscle relaxants, however is not taking his medications.  He also was noted to have an elevated CK and Neurology was called for concern for neuroleptic malignant syndrome.      History reviewed. No pertinent past medical history.  Past Surgical History:  Procedure Laterality Date  . ANTERIOR CERVICAL DECOMP/DISCECTOMY FUSION N/A 05/05/2016   Procedure: POSTERIOR CERVICAL LAMINECTOMY THREE - FIVE WITH FIXATION AND FUSION;  Surgeon: Loura Halt Ditty, MD;  Location: [facility] OR;  Service: Neurosurgery;  Laterality: N/A;    No family history on file. Social History:  reports that he has been smoking. He has been smoking about 1.00 pack per day. He has never used smokeless tobacco. He reports that he does not drink alcohol or use drugs.  Allergies:  Allergies  Allergen Reactions  . Amoxicillin Itching, Rash and Other (See Comments)  . Penicillins Itching, Rash and Other (See Comments)    Has patient had a PCN reaction causing immediate rash, facial/tongue/throat swelling, SOB or lightheadedness with hypotension: Yes Has patient had a PCN reaction causing severe rash  involving mucus membranes or skin necrosis: No Has patient had a PCN reaction that required hospitalization: No Has patient had a PCN reaction occurring within the last 10 years: No If all of the above answers are "NO", then may proceed with Cephalosporin use. Has patient had a PCN reaction causing immediate rash, facial/tongue/throat swelling, SOB or lightheadedness with hypotension: Yes Has patient had a PCN reaction causing severe rash involving mucus membranes or skin necrosis: No Has patient had a PCN reaction that required hospitalization: No Has patient had a PCN reaction occurring within the last 10 years: No If all of the above answers are "NO", then may proceed with Cephalosporin use.   . Clavulanic Acid Other (See Comments)  . Urecholine [Bethanechol] Itching    Itching, swelling, sweating (hot flashes)  . Ambien [Zolpidem Tartrate] Other (See Comments)    hallucinations  . Amoxicillin Rash  . Penicillins Rash    Medications:  I reviewed home medications   ROS:                                                                                                                                     Limited as patient is altered   Examination:                                                                                                      General: Appears well-developed and well-nourished.  Psych: Affect appropriate to situation Eyes: No scleral injection HENT: No OP obstrucion Head: Normocephalic.  Cardiovascular: Normal rate and regular rhythm.  Respiratory: Effort normal and breath sounds normal to anterior ascultation GI: Soft.  No distension. There is no tenderness.  Skin: WDI    Neurological Examination Mental Status: Somnolent but easily arousable.  Oriented to himself, but not place.  Appears to be confused and unable to carry meaningful  conversation.  Appears to be having visual hallucinations.  Speech fluent without evidence of aphasia.  Able to follow simple commands Cranial Nerves: II: Visual fields grossly normal,  III,IV, VI: ptosis not present, extra-ocular motions intact bilaterally, pupils equal, round, reactive to light and accommodation V,VII: smile symmetric, facial light touch sensation normal bilaterally VIII: hearing normal bilaterally IX,X: uvula rises symmetrically XI: bilateral shoulder shrug XII: midline tongue extension Motor: 4+/ 5 strength in all 4 extremities Tone and bulk: Significantly increased tone in all 4 extremities. Sensory: Pinprick and light touch intact throughout, bilaterally Deep Tendon Reflexes: Brisk patellar reflexes.  Unable to elicit ankle reflexes Plantars: Right: downgoing   Left: downgoing Cerebellar: No Gross ataxia Gait: Not assessed     Lab Results: Basic Metabolic Panel: Recent Labs  Lab 10/23/18 0701 10/24/18 0306 10/24/18 0708 10/25/18 0322 10/26/18 0431 10/26/18 1024  NA 139 140 138 143 144  --   K 3.1* 3.8 4.0 3.6 3.5  --   CL 107 108 109 112* 112*  --   CO2 22 18* 17* 18* 17*  --   GLUCOSE 120* 78 86 84 90  --   BUN 19 17 15  23* 25*  --   CREATININE 0.92 0.90 0.88 0.75 0.78  --   CALCIUM 8.5* 9.2 9.1 9.7 9.3  --   MG  --   --   --   --   --  2.3    CBC: Recent Labs  Lab 10/22/18 0309 10/23/18 0701 10/24/18 0306 10/25/18 0322 10/26/18 0431  WBC 10.0 10.5 12.8* 11.2* 9.5  NEUTROABS 7.2 7.7  --   --  7.7  HGB 14.4 12.8* 13.6 13.7 12.7*  HCT 43.1 40.1 42.1 41.1 39.8  MCV 97.3 100.0 100.5* 97.9 100.0  PLT 250 227 212 217 215    Coagulation Studies: Recent Labs    10/24/18 0708 10/25/18 0322 10/26/18 0431  LABPROT 16.6* 15.5* 15.4*  INR 1.4* 1.2 1.2    Imaging: Dg Chest Port 1 View  Result Date: 10/25/2018 CLINICAL DATA:  Overdose on opioids. Patient combative and would not remove necklace. EXAM: PORTABLE CHEST 1 VIEW COMPARISON:   10/22/2018 FINDINGS: Lungs are adequately inflated without consolidation or effusion. Cardiomediastinal silhouette and remainder of the exam is unchanged. IMPRESSION: No active disease. Electronically Signed   By: Elberta Fortis M.D.   On: 10/25/2018 14:56     I have reviewed the above imaging : No imaging performed   ASSESSMENT AND PLAN  38 year old male admitted after intentional overdose, on chronic opiates, multiple muscle relaxants (Flexeril, baclofen, for cervical cord injury consulted for worsening altered mental status and increased muscle rigidity, elevated CK and fever concerning for NMS.  Patient did receive a few doses of Haldol.  Also on Flexeril and methocarbamol.   Possible neuroleptic malignant syndrome, less likely Serotonin syndrome  Opiate withdrawal   Recommendations -Hold all muscle relaxants except baclofen -Schedule Ativan 1 mg every 4 hours -Continue clonidine for opioid withdrawal -Trend CK every 12 hours  Will continue to follow.   Masaichi Kracht [facility] Pager Number [phone]

## 2018-10-26 NOTE — Progress Notes (Addendum)
PROGRESS NOTE    Edward [name]  OFH:[id number] DOB: August 18, 1980 DOA: 10/23/2018 PCP: Medicine, [facility]   Brief Narrative:  HPI per Dr. Unk [name] is a 38 y.o. male with medical history significant for depression, spinal cord injury/chronic cervical spine pain on chronic opiates, who presented to [facility] ED via EMS after intentional overdose of Percocet and suicidal attempt.  EMS was called by his mother who lives next door of their duplex apartment because he had an abnormal behavior last night.  Patient admits to intentional overdose of Percocet, states he took 40 pills.  He was in his normal state of health prior to this.  Denies use of alcohol.  Last visit with his psychiatrist was a month ago.  His psych medications have been adjusted frequently.  Patient admits to having a rifle in his apartment.  Admits to suicidal ideation and attempt with overdose.  No homicidal ideation.  No recent sick contact exposure, no recent travel history.  Afebrile with no respiratory symptoms.  ED Course: In the ED, Lab studies remarkable for elevated acetaminophen level 143.  Poison control called and NAC initiated in the ED.  [facility] asked to admit.    Assessment & Plan:   Principal Problem:   Opiate overdose (HCC) Active Problems:   Tobacco abuse   Neuropathic pain   Spinal cord injury at C1-C4 level (HCC)   Spastic tetraplegia (HCC)   Neurogenic bladder   Neurogenic bowel   Autonomic dysreflexia   Reactive depression   Suicidal ideation  1 intentional opiate overdose with suicide attempt/suicidal ideation Patient with confusion and agitation.  Patient noted to have an elevated acetaminophen level of 143 on presentation.  Patient noted to be on Percocet for chronic cervical spine pain and reported taking 40 pills of Percocet.  Patient was placed on Mucomyst/N- acetylcysteine on admission.  Poison control called and Mucomyst/N-acetylcysteine was initiated.  LFTs within  normal limits.  INR at 1.2 today.  Acetaminophen level was less than 10.  Poison control contacted pharmacy and recommendations were to discontinue Mucomyst.  Mucomyst was discontinued on 10/24/2018.  Patient with hallucinations and agitation.  Patient refusing medications and spitting them out.  Patient placed on the clonidine detox protocol per psychiatry due to concerns for withdrawal.  LFTs within normal limits.  INR within normal limits.  Continue one-to-one sitter.  Supportive care.  Patient seen by psychiatry who are recommending inpatient admission when patient medically cleared.  Per psychiatry if patient refuses voluntary admission will need to be IVC.  Appreciate psychiatry input and recommendations.  2.  Opiate withdrawal Patient noted to be combative, agitated, anxious, restless.  Patient also noted to be hallucinating.  Patient refusing to take medications.  Patient placed on the clonidine withdrawal protocol.  Continue Haldol 1 mg p.o./IM every 6 hours as needed agitation hallucinations.  Clonazepam has been changed to as needed per psychiatry.  Supportive care.  Follow.  3.  Chronic depression/anxiety Patient seen by psychiatry.  Recommendation was to continue clonazepam as needed.  Haldol as needed agitation/hallucinations.   4.  Chronic cervical pain post trauma in 2017 with intentional overdose Patient follows up with the pain clinic.  Pain medications currently on hold due to acute overdose.  Per epic patient's pain doctor notified of admission.  Concern for possible withdrawal patient on clonidine detox protocol.  Follow.  5.  Tobacco abuse Nicotine patch.   6.  Fever Patient noted to have fever yesterday.  Chest x-ray negative  for any acute infiltrates.  Urinalysis unremarkable.  Urine cultures pending.  Blood cultures pending.  Patient also noted to have some tremors agitation, ?  Spasticity versus rigidity.  Patient noted to be on baclofen prior to admission which will resume at  this time.  Resume patient's home regimen of Neurontin and Zanaflex.  Check a total CK level.  Monitor.     DVT prophylaxis: Lovenox. Code Status: Full Family Communication: Updated patient.  No family at bedside. Disposition Plan: Likely inpatient psychiatry once medically stable.   Consultants:   Psychiatry: Dr. Jannifer FranklinAkintayo 10/24/2018  Procedures:   Acute abdominal series for 10/22/2018  Chest x-ray 10/25/2018  Antimicrobials:   None   Subjective: Patient laying in bed in mittens.  Confused.  Agitated.  Hallucinations.  Refusing oral medications.   Objective: Vitals:   10/25/18 1820 10/25/18 2041 10/26/18 0457 10/26/18 0556  BP:  131/88  102/61  Pulse:  (!) 102  96  Resp:  20  20  Temp: (!) 101.4 F (38.6 C) 99.1 F (37.3 C) 99.7 F (37.6 C) 98.2 F (36.8 C)  TempSrc: Oral Oral  Oral  SpO2:    98%    Intake/Output Summary (Last 24 hours) at 10/26/2018 1008 Last data filed at 10/26/2018 0900 Gross per 24 hour  Intake 4348.39 ml  Output 1051 ml  Net 3297.39 ml   There were no vitals filed for this visit.  Examination:  General exam: Agitated.  Confused.  In mittens.  Tremors. Respiratory system: Lungs clear to auscultation bilaterally.  No wheezes, no crackles, no rhonchi.  Normal respiratory effort.  Cardiovascular system: Tachycardia.  No JVD.  No murmurs rubs or gallops.  No lower extremity edema.  Gastrointestinal system: Abdomen is nontender, nondistended, soft, positive bowel sounds.  No rebound.  No guarding.  Central nervous system: Alert.  Confused.  Agitated.  No focal neurological deficits.   Extremities: Symmetric 5 x 5 power.  Patient shaking in lower extremities. Skin: No rashes, lesions or ulcers Psychiatry: Judgement and insight appear poor. Mood & affect appropriate.     Data Reviewed: I have personally reviewed following labs and imaging studies  CBC: Recent Labs  Lab 10/22/18 0309 10/23/18 0701 10/24/18 0306 10/25/18 0322 10/26/18 0431   WBC 10.0 10.5 12.8* 11.2* 9.5  NEUTROABS 7.2 7.7  --   --  7.7  HGB 14.4 12.8* 13.6 13.7 12.7*  HCT 43.1 40.1 42.1 41.1 39.8  MCV 97.3 100.0 100.5* 97.9 100.0  PLT 250 227 212 217 215   Basic Metabolic Panel: Recent Labs  Lab 10/23/18 0701 10/24/18 0306 10/24/18 0708 10/25/18 0322 10/26/18 0431  NA 139 140 138 143 144  K 3.1* 3.8 4.0 3.6 3.5  CL 107 108 109 112* 112*  CO2 22 18* 17* 18* 17*  GLUCOSE 120* 78 86 84 90  BUN 19 17 15  23* 25*  CREATININE 0.92 0.90 0.88 0.75 0.78  CALCIUM 8.5* 9.2 9.1 9.7 9.3   GFR: CrCl cannot be calculated (Unknown ideal weight.). Liver Function Tests: Recent Labs  Lab 10/23/18 0701 10/24/18 0306 10/24/18 0708 10/25/18 0322 10/26/18 0431  AST 14* 19 22 37 34  ALT 11 15 16 19 23   ALKPHOS 53 52 48 54 48  BILITOT 0.6 1.6* 1.3* 1.5* 1.7*  PROT 7.5 7.4 7.4 7.9 7.4  ALBUMIN 4.7 4.4 4.3 4.8 4.4   Recent Labs  Lab 10/22/18 0309  LIPASE 23   No results for input(s): AMMONIA in the last 168 hours. Coagulation Profile:  Recent Labs  Lab 10/23/18 0801 10/24/18 0708 10/25/18 0322 10/26/18 0431  INR 1.2 1.4* 1.2 1.2   Cardiac Enzymes: No results for input(s): CKTOTAL, CKMB, CKMBINDEX, TROPONINI in the last 168 hours. BNP (last 3 results) No results for input(s): PROBNP in the last 8760 hours. HbA1C: No results for input(s): HGBA1C in the last 72 hours. CBG: No results for input(s): GLUCAP in the last 168 hours. Lipid Profile: No results for input(s): CHOL, HDL, LDLCALC, TRIG, CHOLHDL, LDLDIRECT in the last 72 hours. Thyroid Function Tests: No results for input(s): TSH, T4TOTAL, FREET4, T3FREE, THYROIDAB in the last 72 hours. Anemia Panel: No results for input(s): VITAMINB12, FOLATE, FERRITIN, TIBC, IRON, RETICCTPCT in the last 72 hours. Sepsis Labs: No results for input(s): PROCALCITON, LATICACIDVEN in the last 168 hours.  Recent Results (from the past 240 hour(s))  Culture, blood (Routine X 2) w Reflex to ID Panel     Status:  None (Preliminary result)   Collection Time: 10/25/18  2:36 PM  Result Value Ref Range Status   Specimen Description   Final    BLOOD LEFT ANTECUBITAL Performed at [facility], [address], [place], [place] [address]    Special Requests   Final    BOTTLES DRAWN AEROBIC AND ANAEROBIC Blood Culture adequate volume Performed at [facility], [address], [place], [place] [address]    Culture   Final    NO GROWTH < 12 HOURS Performed at [facility], [address] [place], [place] [address]    Report Status PENDING  Incomplete  Culture, blood (Routine X 2) w Reflex to ID Panel     Status: None (Preliminary result)   Collection Time: 10/25/18  2:37 PM  Result Value Ref Range Status   Specimen Description   Final    BLOOD LEFT HAND Performed at [facility], [address], [place], [place] [address]    Special Requests   Final    BOTTLES DRAWN AEROBIC ONLY Blood Culture adequate volume Performed at [facility], [address], [place], [place] [address]    Culture   Final    NO GROWTH < 12 HOURS Performed at [facility], [address], [place], [place] [address]    Report Status PENDING  Incomplete         Radiology Studies: Dg Chest Port 1 View  Result Date: 10/25/2018 CLINICAL DATA:  Overdose on opioids. Patient combative and would not remove necklace. EXAM: PORTABLE CHEST 1 VIEW COMPARISON:  10/22/2018 FINDINGS: Lungs are adequately inflated without consolidation or effusion. Cardiomediastinal silhouette and remainder of the exam is unchanged. IMPRESSION: No active disease. Electronically Signed   By: Elberta Fortis M.D.   On: 10/25/2018 14:56        Scheduled Meds: . cloNIDine  0.1 mg Oral QID   Followed by  . [START ON 10/27/2018] cloNIDine  0.1 mg Oral BID   Followed by  . [START ON 10/29/2018] cloNIDine  0.1 mg Oral Daily  . enoxaparin (LOVENOX) injection  30 mg  Subcutaneous Q24H  . nicotine  21 mg Transdermal Daily  . potassium chloride  40 mEq Oral Once  . sodium chloride flush  10-40 mL Intracatheter Q12H   Continuous Infusions: . lactated ringers 100 mL/hr at 10/26/18 0749     LOS: 3 days    Time spent: 35 minutes    Ramiro Harvest, MD [facility]  If 7PM-7AM, please contact night-coverage [email or web address]  10/26/2018, 10:08 AM

## 2018-10-26 NOTE — Progress Notes (Addendum)
Patient with confusion, hallucinations, rigidity/spasticity.  Patient refusing oral medications.  Patient noted to be on baclofen prior to admission however none since admission.  Patient noted to have fever as high as 101.4 on 10/25/2018.  LFTs are within normal limits.  CK levels obtained this morning elevated at 1015.  Patient did have a leukocytosis which has since improved.  Concern for baclofen withdrawal versus NMS.  Continue hydration with IV fluids.  Check a magnesium level and replete.  Try to keep potassium greater than 4.  Will change IV fluids to include potassium supplementation.  Place on scheduled lorazepam 0.5 mg IV every 6 hours.  Place on IV dantrolene.  Discontinue Haldol.  Baclofen has been reordered however patient spitting out medications per RN.  Supportive care.  Will discuss/consult with neurology for further evaluation and management.  No charge.

## 2018-10-26 NOTE — Progress Notes (Signed)
Patient 's temperature = 101.4; paged MD at 1416. Waiting for new order. Patient is alert.

## 2018-10-27 ENCOUNTER — Inpatient Hospital Stay (HOSPITAL_COMMUNITY): Payer: Medicaid Other

## 2018-10-27 DIAGNOSIS — G210 Malignant neuroleptic syndrome: Secondary | ICD-10-CM

## 2018-10-27 LAB — CBC WITH DIFFERENTIAL/PLATELET
Abs Immature Granulocytes: 0.05 10*3/uL (ref 0.00–0.07)
Basophils Absolute: 0 10*3/uL (ref 0.0–0.1)
Basophils Relative: 1 %
Eosinophils Absolute: 0.1 10*3/uL (ref 0.0–0.5)
Eosinophils Relative: 1 %
HCT: 35.6 % — ABNORMAL LOW (ref 39.0–52.0)
Hemoglobin: 11.4 g/dL — ABNORMAL LOW (ref 13.0–17.0)
Immature Granulocytes: 1 %
Lymphocytes Relative: 13 %
Lymphs Abs: 1.2 10*3/uL (ref 0.7–4.0)
MCH: 32.1 pg (ref 26.0–34.0)
MCHC: 32 g/dL (ref 30.0–36.0)
MCV: 100.3 fL — ABNORMAL HIGH (ref 80.0–100.0)
Monocytes Absolute: 0.8 10*3/uL (ref 0.1–1.0)
Monocytes Relative: 9 %
Neutro Abs: 6.7 10*3/uL (ref 1.7–7.7)
Neutrophils Relative %: 75 %
Platelets: 199 10*3/uL (ref 150–400)
RBC: 3.55 MIL/uL — ABNORMAL LOW (ref 4.22–5.81)
RDW: 12.7 % (ref 11.5–15.5)
WBC: 8.7 10*3/uL (ref 4.0–10.5)
nRBC: 0 % (ref 0.0–0.2)

## 2018-10-27 LAB — COMPREHENSIVE METABOLIC PANEL
ALT: 33 U/L (ref 0–44)
AST: 44 U/L — ABNORMAL HIGH (ref 15–41)
Albumin: 4.1 g/dL (ref 3.5–5.0)
Alkaline Phosphatase: 48 U/L (ref 38–126)
Anion gap: 10 (ref 5–15)
BUN: 17 mg/dL (ref 6–20)
CO2: 20 mmol/L — ABNORMAL LOW (ref 22–32)
Calcium: 9.2 mg/dL (ref 8.9–10.3)
Chloride: 115 mmol/L — ABNORMAL HIGH (ref 98–111)
Creatinine, Ser: 0.61 mg/dL (ref 0.61–1.24)
GFR calc Af Amer: >60 mL/min (ref >60)
GFR calc non Af Amer: >60 mL/min (ref >60)
Glucose, Bld: 129 mg/dL — ABNORMAL HIGH (ref 70–99)
Potassium: 3.8 mmol/L (ref 3.5–5.1)
Sodium: 145 mmol/L (ref 135–145)
Total Bilirubin: 1.6 mg/dL — ABNORMAL HIGH (ref 0.3–1.2)
Total Protein: 7.3 g/dL (ref 6.5–8.1)

## 2018-10-27 LAB — CK
Total CK: 1452 U/L — ABNORMAL HIGH (ref 49–397)
Total CK: 952 U/L — ABNORMAL HIGH (ref 49–397)

## 2018-10-27 LAB — LACTATE DEHYDROGENASE: LDH: 191 U/L (ref 98–192)

## 2018-10-27 LAB — AMMONIA: Ammonia: 27 umol/L (ref 9–35)

## 2018-10-27 LAB — MAGNESIUM: Magnesium: 2.1 mg/dL (ref 1.7–2.4)

## 2018-10-27 MED ORDER — ENOXAPARIN SODIUM 40 MG/0.4ML ~~LOC~~ SOLN
40.0000 mg | SUBCUTANEOUS | Status: DC
  Administered 2018-10-27 – 2018-10-30 (×4): 40 mg via SUBCUTANEOUS
  Filled 2018-10-27 (×5): qty 0.4

## 2018-10-27 MED ORDER — SODIUM CHLORIDE 0.9 % IV BOLUS
1000.0000 mL | Freq: Once | INTRAVENOUS | Status: AC
  Administered 2018-10-27: 1000 mL via INTRAVENOUS

## 2018-10-27 MED ORDER — DEXTROSE-NACL 5-0.45 % IV SOLN
INTRAVENOUS | Status: DC
  Administered 2018-10-27 – 2018-10-29 (×5): via INTRAVENOUS

## 2018-10-27 MED ORDER — BISACODYL 10 MG RE SUPP
10.0000 mg | Freq: Every day | RECTAL | Status: DC
  Administered 2018-10-27 – 2018-10-28 (×2): 10 mg via RECTAL
  Filled 2018-10-27 (×2): qty 1

## 2018-10-27 NOTE — Progress Notes (Signed)
PROGRESS NOTE    Edward [name]  OAC:[id number] DOB: Jan 20, 1981 DOA: 10/23/2018 PCP: Medicine, [facility]   Brief Narrative:  HPI per Dr. Unk [name] is a 38 y.o. male with medical history significant for depression, spinal cord injury/chronic cervical spine pain on chronic opiates, who presented to [facility] ED via EMS after intentional overdose of Percocet and suicidal attempt.  EMS was called by his mother who lives next door of their duplex apartment because he had an abnormal behavior last night.  Patient admits to intentional overdose of Percocet, states he took 40 pills.  He was in his normal state of health prior to this.  Denies use of alcohol.  Last visit with his psychiatrist was a month ago.  His psych medications have been adjusted frequently.  Patient admits to having a rifle in his apartment.  Admits to suicidal ideation and attempt with overdose.  No homicidal ideation.  No recent sick contact exposure, no recent travel history.  Afebrile with no respiratory symptoms.  ED Course: In the ED, Lab studies remarkable for elevated acetaminophen level 143.  Poison control called and NAC initiated in the ED.  TRH asked to admit.    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  Patient noted to have an elevated acetaminophen level of 143 on presentation.  Patient noted to be on Percocet for chronic cervical spine pain and reported taking 40 pills of Percocet.  Patient was placed on Mucomyst/N- acetylcysteine on admission.  Poison control called and  Mucomyst/N-acetylcysteine was initiated.  LFTs within normal limits.  INR at 1.2 today.  Acetaminophen level was less than 10.  Poison control contacted pharmacy and recommendations were to discontinue Mucomyst.  Mucomyst was discontinued on 10/24/2018.  Patient with hallucinations and agitation.  Patient refusing medications and spitting them out.  Patient placed on the clonidine detox protocol per psychiatry due to concerns for withdrawal.  LFTs within normal limits.  INR within normal limits.  Continue one-to-one sitter.  Supportive care.  Patient seen by psychiatry who are recommending inpatient admission when patient medically cleared.  Per psychiatry if patient refuses voluntary admission will need to be IVC.  Appreciate psychiatry input and recommendations.  2.  Possible neuroleptic malignant syndrome versus baclofen withdrawal Patient noted during the hospitalization to have muscle rigidity, fevers, elevated CK levels, altered mental status with hallucinations concerning for neuroleptic malignant syndrome.  Less likely to be serotonin syndrome.  Patient was on high doses of baclofen which patient had not received since admission.  Baclofen resumed however patient refusing oral intake and spitting medications out.  Patient placed on scheduled Ativan 1 mg every 4 hours.  Patient on clonidine detox protocol.  CK levels trending up and currently at 1452 from 1015.  LDH at 191.  Continue IV fluid resuscitation, keep potassium greater than 4 and magnesium greater than 2.  Serial CK levels twice daily.  Continue scheduled Ativan 1 mg every 4 hours.  Patient received a dose of IV dantrolene on 10/26/2018.  Neurology was consulted and are following and appreciate input and recommendations.  3.  Opiate withdrawal Patient noted to be combative, agitated, anxious, restless.  Patient also noted to be hallucinating.  Patient refusing to take medications.  Patient placed on the clonidine withdrawal protocol.  Haldol  discontinued due to concerns for neuroleptic malignant syndrome.  Patient placed on scheduled IV Ativan. Clonazepam has been changed to as needed per psychiatry.  Supportive care.  Follow.  4.  Chronic depression/anxiety Patient seen by psychiatry.  Recommendation was to continue clonazepam as needed.  Haldol has been discontinued due to concerns for neuroleptic malignant syndrome.  Patient started on scheduled IV Ativan 1 mg every 4 hours.  Will need inpatient psychiatric admission when medically stable.   5.  Chronic cervical pain post trauma in 2017 with intentional overdose Patient follows up with the pain clinic.  Pain medications currently on hold due to acute overdose.  Per epic patient's pain doctor notified of admission.  Concern for possible withdrawal patient on clonidine detox protocol.  Outpatient follow-up.  Follow.  6.  Tobacco abuse Nicotine patch.   7.  Fever Patient noted to have fever fevers during the hospitalization.  Likely secondary to problem #2.  Chest x-ray negative for any acute infiltrates.  Urinalysis unremarkable.  Urine cultures negative.  Blood cultures pending with no growth to date.  Patient also noted to have some tremors agitation, ?  Spasticity versus rigidity.  Patient noted to be on baclofen prior to admission which will resume at this time.  Continue Neurontin.  Zanaflex has been discontinued.  Patient refusing some of his medications and spitting them out.  Total CK was elevated.  Patient placed on IV Ativan and given a dose of IV dantrolene due to concerns for possible neuroleptic malignant syndrome versus baclofen withdrawal.    DVT prophylaxis: Lovenox. Code Status: Full Family Communication: Updated patient.  No family at bedside. Disposition Plan: Likely inpatient psychiatry once medically stable.   Consultants:   Psychiatry: Dr. Jannifer Franklin 10/24/2018  Neurology: Dr. Laurence Slate 10/26/2018  Procedures:   Acute abdominal series  10/22/2018  Chest x-ray  10/25/2018  Antimicrobials:   None   Subjective: Patient confused however following commands appropriately.  Still hallucinating per RN.  Per RN patient took morning dose of baclofen today.   Objective: Vitals:   10/26/18 1612 10/26/18 2310 10/27/18 0332 10/27/18 0602  BP:  (!) 142/82  133/84  Pulse: (!) 105 93  (!) 102  Resp: Temp:  98.4 F (36.9 C) 98.5 F (36.9 C) 98.2 F (36.8 C)  TempSrc:  Axillary Axillary Oral  SpO2:  100%  99%  Weight:      Height:        Intake/Output Summary (Last 24 hours) at 10/27/2018 1029 Last data filed at 10/27/2018 0800 Gross per 24 hour  Intake 2865.41 ml  Output 1750 ml  Net 1115.41 ml   Filed Weights   10/26/18 1059  Weight: 66.7 kg    Examination:  General exam: Agitated.  Confused.  In mittens.  Rigid. Respiratory system: Clear to auscultation bilaterally anterior lung fields.  No wheezes, no crackles, no rhonchi.  Cardiovascular system: Regular rate rhythm no murmurs rubs or gallops.  No JVD.  No lower extremity edema.  Gastrointestinal system: Abdomen is soft, nondistended, nontender, positive bowel sounds.  No rebound.  No guarding.  Central nervous system: Alert.  Confused.  Agitated.  No focal neurological deficits.   Extremities: Rigidity.  Lower extremity shaking. Skin: No rashes, lesions or ulcers Psychiatry: Judgement and insight  appear poor. Mood & affect appropriate.     Data Reviewed: I have personally reviewed following labs and imaging studies  CBC: Recent Labs  Lab 10/22/18 0309 10/23/18 0701 10/24/18 0306 10/25/18 0322 10/26/18 0431 10/27/18 0531  WBC 10.0 10.5 12.8* 11.2* 9.5 8.7  NEUTROABS 7.2 7.7  --   --  7.7 6.7  HGB 14.4 12.8* 13.6 13.7 12.7* 11.4*  HCT 43.1 40.1 42.1 41.1 39.8 35.6*  MCV 97.3 100.0 100.5* 97.9 100.0 100.3*  PLT 250 227 212 217 215 199   Basic Metabolic Panel: Recent Labs  Lab 10/24/18 0306 10/24/18 0708 10/25/18 0322 10/26/18 0431 10/26/18 1024 10/27/18 0531   NA 140 138 143 144  --  145  K 3.8 4.0 3.6 3.5  --  3.8  CL 108 109 112* 112*  --  115*  CO2 18* 17* 18* 17*  --  20*  GLUCOSE 78 86 84 90  --  129*  BUN 17 15 23* 25*  --  17  CREATININE 0.90 0.88 0.75 0.78  --  0.61  CALCIUM 9.2 9.1 9.7 9.3  --  9.2  MG  --   --   --   --  2.3 2.1   GFR: Estimated Creatinine Clearance: 118.1 mL/min (by C-G formula based on SCr of 0.61 mg/dL). Liver Function Tests: Recent Labs  Lab 10/24/18 0306 10/24/18 0708 10/25/18 0322 10/26/18 0431 10/27/18 0531  AST 19 22 37 34 44*  ALT 15 16 19 23  33  ALKPHOS 52 48 54 48 48  BILITOT 1.6* 1.3* 1.5* 1.7* 1.6*  PROT 7.4 7.4 7.9 7.4 7.3  ALBUMIN 4.4 4.3 4.8 4.4 4.1   Recent Labs  Lab 10/22/18 0309  LIPASE 23   No results for input(s): AMMONIA in the last 168 hours. Coagulation Profile: Recent Labs  Lab 10/23/18 0801 10/24/18 0708 10/25/18 0322 10/26/18 0431  INR 1.2 1.4* 1.2 1.2   Cardiac Enzymes: Recent Labs  Lab 10/26/18 1024 10/27/18 0531  CKTOTAL 1,015* 1,452*   BNP (last 3 results) No results for input(s): PROBNP in the last 8760 hours. HbA1C: No results for input(s): HGBA1C in the last 72 hours. CBG: No results for input(s): GLUCAP in the last 168 hours. Lipid Profile: No results for input(s): CHOL, HDL, LDLCALC, TRIG, CHOLHDL, LDLDIRECT in the last 72 hours. Thyroid Function Tests: No results for input(s): TSH, T4TOTAL, FREET4, T3FREE, THYROIDAB in the last 72 hours. Anemia Panel: No results for input(s): VITAMINB12, FOLATE, FERRITIN, TIBC, IRON, RETICCTPCT in the last 72 hours. Sepsis Labs: No results for input(s): PROCALCITON, LATICACIDVEN in the last 168 hours.  Recent Results (from the past 240 hour(s))  Culture, Urine     Status: None   Collection Time: 10/25/18  9:03 AM  Result Value Ref Range Status   Specimen Description   Final    URINE, RANDOM Performed at [facility], [address] [place], [place] [address]    Special Requests    Final    NONE Performed at [facility], [address] [place], [place] [address]    Culture   Final    NO GROWTH Performed at [facility], [address] [place], [place] [address]    Report Status 10/26/2018 FINAL  Final  Culture, blood (Routine X 2) w Reflex to ID Panel     Status: None (Preliminary result)   Collection Time: 10/25/18  2:36 PM  Result Value Ref Range Status   Specimen Description  Final    BLOOD LEFT ANTECUBITAL Performed at [facility], [address] [place], [place] [address]    Special Requests   Final    BOTTLES DRAWN AEROBIC AND ANAEROBIC Blood Culture adequate volume Performed at [facility], [address] [place], [place] [address]    Culture   Final    NO GROWTH 2 DAYS Performed at [facility], [address] [place], [place] [address]    Report Status PENDING  Incomplete  Culture, blood (Routine X 2) w Reflex to ID Panel     Status: None (Preliminary result)   Collection Time: 10/25/18  2:37 PM  Result Value Ref Range Status   Specimen Description   Final    BLOOD LEFT HAND Performed at [facility], [address], [place], [place] [address]    Special Requests   Final    BOTTLES DRAWN AEROBIC ONLY Blood Culture adequate volume Performed at [facility], [address], [place], [place] [address]    Culture   Final    NO GROWTH 2 DAYS Performed at [facility], [address] [place], [place] [address]    Report Status PENDING  Incomplete         Radiology Studies: Dg Chest Port 1 View  Result Date: 10/25/2018 CLINICAL DATA:  Overdose on opioids. Patient combative and would not remove necklace. EXAM: PORTABLE CHEST 1 VIEW COMPARISON:  10/22/2018 FINDINGS: Lungs are adequately inflated without consolidation or effusion. Cardiomediastinal silhouette and remainder of the exam is unchanged. IMPRESSION: No active  disease. Electronically Signed   By: Elberta Fortis M.D.   On: 10/25/2018 14:56        Scheduled Meds:  baclofen  30 mg Oral QID   cloNIDine  0.1 mg Oral BID   Followed by   Melene Muller ON 10/29/2018] cloNIDine  0.1 mg Oral Daily   enoxaparin (LOVENOX) injection  30 mg Subcutaneous Q24H   gabapentin  600 mg Oral QID   LORazepam  1 mg Intravenous Q4H   nicotine  21 mg Transdermal Daily   pantoprazole (PROTONIX) IV  40 mg Intravenous Q24H   senna-docusate  2 tablet Oral QODAY   sodium chloride flush  10-40 mL Intracatheter Q12H   Continuous Infusions:  dextrose 5 % and 0.45% NaCl 125 mL/hr at 10/27/18 1010     LOS: 4 days    Time spent: 40 minutes    Ramiro Harvest, MD [facility]  If 7PM-7AM, please contact night-coverage [email or web address] 10/27/2018, 10:29 AM

## 2018-10-27 NOTE — Progress Notes (Signed)
NEUROLOGY PROGRESS NOTE  Subjective: Patient remains slightly agitated, per sitter he is having hallucinations of people.  When I asked him about the hallucinations he does not realize they are not real, and he is seeing people that he knows.  He does not see people that are dead or passed away.  He feels he is getting better however the nurse who saw him yesterday feels he is no better otherwise other than his fever.  Exam: Vitals:   10/27/18 0602 10/27/18 1317  BP: 133/84 128/79  Pulse: (!) 102 90  Resp: 19 18  Temp: 98.2 F (36.8 C) 99.5 F (37.5 C)  SpO2: 99% 100%    Physical Exam   HEENT-  Normocephalic, no lesions, without obvious abnormality.  Normal external eye and conjunctiva.   Extremities- Warm, dry and intact Musculoskeletal-increased rigidity Skin-warm and dry, no hyperpigmentation, vitiligo, or suspicious lesions    Neuro:  Mental Status: Alert, oriented to hospital but does not know the hospital that he is in.  Remains confused and gives 1 word answers but is able to name my thumb, watch, follow commands. Cranial Nerves: II:  Visual fields grossly normal,  III,IV, VI: ptosis not present, extra-ocular motions intact bilaterally pupils equal, round, reactive to light and accommodation V,VII: smile symmetric, facial light touch sensation normal bilaterally VIII: hearing normal bilaterally IX,X: Palate rises midline XI: bilateral shoulder shrug XII: midline tongue extension Motor: Significant increased tone in all 4 extremities with the legs being greater than the upper extremities.  Bilateral arms held at 45 degrees with the wrist flexed.  Right greater than left.  Any stimulation causes legs to have mild clonus. Sensory: Pinprick and light touch intact throughout, bilaterally Deep Tendon Reflexes: Brisk bilateral upper extremities with 3+ and 4+ bilateral knee jerks  Plantars: Mute on the right with upgoing on the left     Medications:  Scheduled: .  baclofen  30 mg Oral QID  . cloNIDine  0.1 mg Oral BID   Followed by  . [START ON 10/29/2018] cloNIDine  0.1 mg Oral Daily  . enoxaparin (LOVENOX) injection  40 mg Subcutaneous Q24H  . gabapentin  600 mg Oral QID  . LORazepam  1 mg Intravenous Q4H  . nicotine  21 mg Transdermal Daily  . pantoprazole (PROTONIX) IV  40 mg Intravenous Q24H  . senna-docusate  2 tablet Oral QODAY  . sodium chloride flush  10-40 mL Intracatheter Q12H    Pertinent Labs/Diagnostics: CK has actually become elevated from 1015-1452 Glucose 129 AST 44   No results found.   Felicie Morn PA-C [facility] [phone]   Assessment: 38 year old male admitted after intentional overdose, on chronic opiates, multiple muscle relaxants including Flexeril, baclofen.  Patient continues to have altered mental status with elevated CK.  Likely  NMS and less likely serotonin syndrome.    Recommendations: As recommended before -Hold all muscle relaxants except baclofen -Schedule Ativan 1 mg per 4 hours -Continue clonidine for opiate withdrawal -Continue to trend CK levels every 12 hours    10/27/2018, 2:47 PM

## 2018-10-27 NOTE — Progress Notes (Signed)
Pharmacy Brief Note:  Pt currently has orders for enoxaparin 30 mg subQ daily for DVT ppx. Given wt > 45 kg and CrCl > 30 mL/min will increase dose to enoxaparin 40 mg subQ daily. Per protocol.  Cindi Carbon, PharmD 10/27/18 11:44 AM

## 2018-10-28 LAB — CBC WITH DIFFERENTIAL/PLATELET
Abs Immature Granulocytes: 0.03 10*3/uL (ref 0.00–0.07)
Basophils Absolute: 0 10*3/uL (ref 0.0–0.1)
Basophils Relative: 1 %
Eosinophils Absolute: 0.2 10*3/uL (ref 0.0–0.5)
Eosinophils Relative: 3 %
HCT: 35.3 % — ABNORMAL LOW (ref 39.0–52.0)
Hemoglobin: 11.1 g/dL — ABNORMAL LOW (ref 13.0–17.0)
Immature Granulocytes: 0 %
Lymphocytes Relative: 20 %
Lymphs Abs: 1.3 10*3/uL (ref 0.7–4.0)
MCH: 31.7 pg (ref 26.0–34.0)
MCHC: 31.4 g/dL (ref 30.0–36.0)
MCV: 100.9 fL — ABNORMAL HIGH (ref 80.0–100.0)
Monocytes Absolute: 0.7 10*3/uL (ref 0.1–1.0)
Monocytes Relative: 10 %
Neutro Abs: 4.5 10*3/uL (ref 1.7–7.7)
Neutrophils Relative %: 66 %
Platelets: 151 10*3/uL (ref 150–400)
RBC: 3.5 MIL/uL — ABNORMAL LOW (ref 4.22–5.81)
RDW: 12.5 % (ref 11.5–15.5)
WBC: 6.8 10*3/uL (ref 4.0–10.5)
nRBC: 0 % (ref 0.0–0.2)

## 2018-10-28 LAB — COMPREHENSIVE METABOLIC PANEL
ALT: 39 U/L (ref 0–44)
AST: 33 U/L (ref 15–41)
Albumin: 3.7 g/dL (ref 3.5–5.0)
Alkaline Phosphatase: 43 U/L (ref 38–126)
Anion gap: 8 (ref 5–15)
BUN: 10 mg/dL (ref 6–20)
CO2: 25 mmol/L (ref 22–32)
Calcium: 8.7 mg/dL — ABNORMAL LOW (ref 8.9–10.3)
Chloride: 107 mmol/L (ref 98–111)
Creatinine, Ser: 0.62 mg/dL (ref 0.61–1.24)
GFR calc Af Amer: >60 mL/min (ref >60)
GFR calc non Af Amer: >60 mL/min (ref >60)
Glucose, Bld: 126 mg/dL — ABNORMAL HIGH (ref 70–99)
Potassium: 2.8 mmol/L — ABNORMAL LOW (ref 3.5–5.1)
Sodium: 140 mmol/L (ref 135–145)
Total Bilirubin: 1.1 mg/dL (ref 0.3–1.2)
Total Protein: 6.4 g/dL — ABNORMAL LOW (ref 6.5–8.1)

## 2018-10-28 LAB — GLUCOSE, CAPILLARY
Glucose-Capillary: 121 mg/dL — ABNORMAL HIGH (ref 70–99)
Glucose-Capillary: 113 mg/dL — ABNORMAL HIGH (ref 70–99)
Glucose-Capillary: 121 mg/dL — ABNORMAL HIGH (ref 70–99)
Glucose-Capillary: 127 mg/dL — ABNORMAL HIGH (ref 70–99)

## 2018-10-28 LAB — CK: Total CK: 579 U/L — ABNORMAL HIGH (ref 49–397)

## 2018-10-28 MED ORDER — POTASSIUM CHLORIDE CRYS ER 20 MEQ PO TBCR
40.0000 meq | EXTENDED_RELEASE_TABLET | ORAL | Status: AC
  Administered 2018-10-28 (×2): 40 meq via ORAL
  Filled 2018-10-28 (×2): qty 2

## 2018-10-28 NOTE — Progress Notes (Signed)
PROGRESS NOTE    Edward [name]  ZOX:[id number] DOB: January 01, 1981 DOA: 10/23/2018 PCP: Medicine, [facility]    Brief Narrative:  38 year old male who presented after a narcotic overdose.  He does have significant past medical history for depression and spinal cord injury.  Patient was brought to the hospital due to Percocet overdose about 40 pills as a suicidal attempt.  He was noted to have abnormal behavior by the neighbors.  On his initial physical examination his blood pressure was 138/83, heart rate 84, temperature 98.6, respiratory rate 13, oxygen saturation 97%.  His lungs were clear to auscultation bilaterally, heart S1-S2 present and rhythmic, abdomen soft, no lower extremity edema, he was awake and alert.   Patient was admitted to the hospital working diagnosis of narcotic overdose/suicidal attempt.  While hospitalized patient developed neuroleptic malignant syndrome, opioid withdrawals.  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 Neuroleptic malignant syndrome, complicated with opiate and baclofen withdrawal. Slowly improving, less rigidity, he has remained afebrile. Continue to be very weak and deconditioned. Continue as needed lorazepam and qid baclofen. CK is trending down. Will decrease rate of IV fluids to 100 ml per H. Continue as needed lorazepam.   2. Impending rhabdomyolysis with hypokalemia. CK trending down, renal function has remained stable. Decrease IV fluids to 100 ml per H. Follow renal panel in am, along with CK. K this am is down to 2,7, will order 80 meq Kcl in 2 divided doses.   2. Opiate overdose, suicidal attempt. Patient awake and alert, no confusion or agitation. Pending to be transferred to psych unit.   3. Depression/  chronic pain syndrome/ cervical injury. Continue pain control, physical therapy evaluation.   4. Tobacco abuse. Continue with nicotine patch.   DVT prophylaxis: enoxaparin   Code Status:  full Family Communication: no family at the bedside  Disposition Plan/ discharge barriers: pending clinical improvement, transfer to inpatient psych when medically stable.   Body mass index is 19.94 kg/m. Malnutrition Type:      Malnutrition Characteristics:      Nutrition Interventions:     RN Pressure Injury Documentation:     Consultants:   Neurology   Procedures:     Antimicrobials:       Subjective: Patient continue to be very weak and deconditioned, no chest pain, no dyspnea, no nausea or vomiting.   Objective: Vitals:   10/27/18 1317 10/27/18 2128 10/28/18 0548 10/28/18 0807  BP: 128/79 121/87 113/77 117/73  Pulse: 90 74 (!) 58 64  Resp: Temp: 99.5 F (37.5 C) 98.7 F (37.1 C) 97.9 F (36.6 C) 98.1 F (36.7 C)  TempSrc: Oral Oral Oral Oral  SpO2: 100% 99% 99% 99%  Weight:      Height:        Intake/Output Summary (Last 24 hours) at 10/28/2018 1039 Last data filed at 10/28/2018 0602 Gross per 24 hour  Intake 2582.26 ml  Output 1578 ml  Net 1004.26 ml   Filed Weights   10/26/18 1059  Weight: 66.7 kg    Examination:   General: deconditioned and ill looking appearing  Neurology: Awake and alert, positive decreased strength all 4 extremities 3 /5 proximal and distal E ENT: mild pallor, no icterus, oral  mucosa moist Cardiovascular: No JVD. S1-S2 present, rhythmic, no gallops, rubs, or murmurs. No lower extremity edema. Pulmonary: positive breath sounds bilaterally, adequate air movement, no wheezing, rhonchi or rales. Gastrointestinal. Abdomen with no organomegaly, non tender, no rebound or guarding Skin. No rashes Musculoskeletal: no joint deformities     Data Reviewed: I have personally reviewed following labs and imaging studies   CBC: Recent Labs  Lab 10/22/18 0309 10/23/18 0701 10/24/18 0306 10/25/18 0322 10/26/18 0431 10/27/18 0531 10/28/18 0531  WBC 10.0 10.5 12.8* 11.2* 9.5 8.7 6.8  NEUTROABS 7.2 7.7  --   --  7.7 6.7 4.5  HGB 14.4 12.8* 13.6 13.7 12.7* 11.4* 11.1*  HCT 43.1 40.1 42.1 41.1 39.8 35.6* 35.3*  MCV 97.3 100.0 100.5* 97.9 100.0 100.3* 100.9*  PLT 250 227 212 217 215 199 151   Basic Metabolic Panel: Recent Labs  Lab 10/24/18 0708 10/25/18 0322 10/26/18 0431 10/26/18 1024 10/27/18 0531 10/28/18 0531  NA 138 143 144  --  145 140  K 4.0 3.6 3.5  --  3.8 2.8*  CL 109 112* 112*  --  115* 107  CO2 17* 18* 17*  --  20* 25  GLUCOSE 86 84 90  --  129* 126*  BUN 15 23* 25*  --  17 10  CREATININE 0.88 0.75 0.78  --  0.61 0.62  CALCIUM 9.1 9.7 9.3  --  9.2 8.7*  MG  --   --   --  2.3 2.1  --    GFR: Estimated Creatinine Clearance: 118.1 mL/min (by C-G formula based on SCr of 0.62 mg/dL). Liver Function Tests: Recent Labs  Lab 10/24/18 0708 10/25/18 0322 10/26/18 0431 10/27/18 0531 10/28/18 0531  AST 22 37 34 44* 33  ALT 16 19 23  33 39  ALKPHOS 48 54 48 48 43  BILITOT 1.3* 1.5* 1.7* 1.6* 1.1  PROT 7.4 7.9 7.4 7.3 6.4*  ALBUMIN 4.3 4.8 4.4 4.1 3.7   Recent Labs  Lab 10/22/18 0309  LIPASE 23   Recent Labs  Lab 10/27/18 1853  AMMONIA 27   Coagulation Profile: Recent Labs  Lab 10/23/18 0801 10/24/18 0708 10/25/18 0322 10/26/18 0431  INR 1.2 1.4* 1.2 1.2   Cardiac Enzymes: Recent Labs  Lab 10/26/18 1024 10/27/18 0531 10/27/18 1853 10/28/18 0531  CKTOTAL 1,015* 1,452* 952* 579*   BNP (last 3 results) No results for input(s): PROBNP in the last 8760 hours. HbA1C: No results for input(s): HGBA1C in the last 72 hours. CBG: Recent Labs  Lab 10/28/18 0813  GLUCAP 121*   Lipid Profile: No results for input(s): CHOL, HDL, LDLCALC, TRIG, CHOLHDL, LDLDIRECT in the last 72 hours. Thyroid Function Tests: No results for input(s): TSH, T4TOTAL, FREET4, T3FREE,  THYROIDAB in the last 72 hours. Anemia Panel: No results for input(s): VITAMINB12, FOLATE, FERRITIN, TIBC, IRON, RETICCTPCT in the last 72 hours.    Radiology Studies: I have reviewed all of the imaging during this hospital visit personally     Scheduled Meds: . baclofen  30 mg Oral QID  . bisacodyl  10 mg Rectal Daily  . [START ON 10/29/2018] cloNIDine  0.1 mg Oral Daily  . enoxaparin (LOVENOX) injection  40 mg Subcutaneous Q24H  . gabapentin  600 mg Oral QID  . LORazepam  1 mg Intravenous Q4H  . nicotine  21 mg Transdermal Daily  . pantoprazole (PROTONIX) IV  40 mg Intravenous Q24H  . potassium chloride  40 mEq Oral Q4H  . senna-docusate  2 tablet  Oral QODAY  . sodium chloride flush  10-40 mL Intracatheter Q12H   Continuous Infusions: . dextrose 5 % and 0.45% NaCl 125 mL/hr at 10/28/18 0919     LOS: 5 days        Sang Blount Annett Gula, MD

## 2018-10-28 NOTE — Progress Notes (Signed)
Progress note       NEUROLOGY PROGRESS NOTE  Subjective: Patient is less agitated today, doing much better, alert and oriented.  He is slow to respond but does respond correctly.  Has no discomfort  Exam: Vitals:   10/28/18 0548 10/28/18 0807  BP: 113/77 117/73  Pulse: (!) 58 64  Resp: 16   Temp: 97.9 F (36.6 C) 98.1 F (36.7 C)  SpO2: 99% 99%    Physical Exam   HEENT-  Normocephalic, no lesions, without obvious abnormality.  Normal external eye and conjunctiva.   Extremities- Warm, dry and intact Musculoskeletal-no joint tenderness, deformity or swelling Skin-warm and dry, no hyperpigmentation, vitiligo, or suspicious lesions    Neuro:  Mental Status: Alert, oriented to hospital, month, city, thought process slow.  Speech fluent without evidence of aphasia.  Able to follow simple commands without difficulty.  No longer having hallucinations per patient Cranial Nerves: II:  Visual fields grossly normal,  III,IV, VI: ptosis not present, extra-ocular motions intact bilaterally pupils equal, round, reactive to light and accommodation V,VII: smile symmetric, facial light touch sensation normal bilaterally VIII: hearing normal bilaterally IX,X: Palate rises midline XI: bilateral shoulder shrug XII: midline tongue extension Motor: 4/5 throughout, has less rigidity today in the upper extremities and lower extremities but still moderately rigid.  Wrists are still flexed inward. Tone and bulk:normal tone throughout; no atrophy noted Sensory: Pinprick and light touch intact throughout, bilaterally Deep Tendon Reflexes: 3+ in the upper extremities with 4+/2 beats of clonus bilateral knee jerk.  No ankle jerks Plantars: Mute on the right with upgoing on the left     Medications:  Scheduled: . baclofen  30 mg Oral QID  . bisacodyl  10 mg Rectal Daily  . cloNIDine  0.1 mg Oral BID   Followed by  . [START ON 10/29/2018] cloNIDine  0.1 mg Oral Daily  . enoxaparin (LOVENOX)  injection  40 mg Subcutaneous Q24H  . gabapentin  600 mg Oral QID  . LORazepam  1 mg Intravenous Q4H  . nicotine  21 mg Transdermal Daily  . pantoprazole (PROTONIX) IV  40 mg Intravenous Q24H  . senna-docusate  2 tablet Oral QODAY  . sodium chloride flush  10-40 mL Intracatheter Q12H    Pertinent Labs/Diagnostics: CK has trended down significantly to 579 Potassium 2.8   Ct Head Wo Contrast  Result Date: 10/27/2018 CLINICAL DATA:  Overdose. EXAM: CT HEAD WITHOUT CONTRAST TECHNIQUE: Contiguous axial images were obtained from the base of the skull through the vertex without intravenous contrast. COMPARISON:  None. FINDINGS: Brain: There is no evidence of acute infarct, intracranial hemorrhage, mass, midline shift, or extra-axial fluid collection. The ventricles and sulci are normal. Vascular: No hyperdense vessel. Skull: No fracture or focal osseous lesion. Sinuses/Orbits: Minimal posterior left ethmoid air cell mucosal thickening. Clear mastoid air cells. Unremarkable orbits. Other: None. IMPRESSION: Negative head CT. Electronically Signed   By: Sebastian Ache M.D.   On: 10/27/2018 17:05        Assessment: 39 year old male admitted after intentional overdose, on chronic opiates, multiple muscle relaxants including Flexeril and baclofen.  Patient has improved on altered mental status and is less agitated.  CK has significantly decreased.  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    Recommendations: - As stated prior: -Hold all muscle relaxants except baclofen for now -Scheduled Ativan one 1 mg per 4 hours -Continue clonidine for opiate withdrawal -Continue to trend CK levels every 12  Felicie MornDavid Kaylub Detienne PA-C [facility] [phone]   10/28/2018, 8:21 AM

## 2018-10-28 NOTE — TOC Progression Note (Signed)
Transition of Care [facility]) - Progression Note    Patient Details  Name: DARRELL EMRICH MRN: [id number] Date of Birth: [birth year]  Transition of Care [facility] CM/SW Contact  Coralyn Helling, [place] Phone Number: 10/28/2018, 1:18 PM  Clinical Narrative:   Patient not medically stable to transport to behavior health.          Expected Discharge Plan and Services           Expected Discharge Date: (unknown)                         Social Determinants of Health (SDOH) Interventions    Readmission Risk Interventions No flowsheet data found.

## 2018-10-29 LAB — BASIC METABOLIC PANEL
Anion gap: 7 (ref 5–15)
BUN: 10 mg/dL (ref 6–20)
CO2: 24 mmol/L (ref 22–32)
Calcium: 8.4 mg/dL — ABNORMAL LOW (ref 8.9–10.3)
Chloride: 108 mmol/L (ref 98–111)
Creatinine, Ser: 0.57 mg/dL — ABNORMAL LOW (ref 0.61–1.24)
GFR calc Af Amer: >60 mL/min (ref >60)
GFR calc non Af Amer: >60 mL/min (ref >60)
Glucose, Bld: 114 mg/dL — ABNORMAL HIGH (ref 70–99)
Potassium: 2.9 mmol/L — ABNORMAL LOW (ref 3.5–5.1)
Sodium: 139 mmol/L (ref 135–145)

## 2018-10-29 LAB — GLUCOSE, CAPILLARY: Glucose-Capillary: 101 mg/dL — ABNORMAL HIGH (ref 70–99)

## 2018-10-29 LAB — CK: Total CK: 243 U/L (ref 49–397)

## 2018-10-29 MED ORDER — BACLOFEN 10 MG PO TABS
10.0000 mg | ORAL_TABLET | Freq: Four times a day (QID) | ORAL | Status: DC
  Administered 2018-10-29 – 2018-10-31 (×8): 10 mg via ORAL
  Filled 2018-10-29 (×8): qty 1

## 2018-10-29 MED ORDER — POTASSIUM CHLORIDE CRYS ER 20 MEQ PO TBCR
40.0000 meq | EXTENDED_RELEASE_TABLET | ORAL | Status: AC
  Administered 2018-10-29 (×2): 40 meq via ORAL
  Filled 2018-10-29 (×3): qty 2

## 2018-10-29 MED ORDER — PANTOPRAZOLE SODIUM 40 MG PO TBEC
40.0000 mg | DELAYED_RELEASE_TABLET | Freq: Every day | ORAL | Status: DC
  Administered 2018-10-29 – 2018-10-30 (×2): 40 mg via ORAL
  Filled 2018-10-29 (×2): qty 1

## 2018-10-29 MED ORDER — ACETAMINOPHEN 325 MG PO TABS
650.0000 mg | ORAL_TABLET | Freq: Four times a day (QID) | ORAL | Status: DC | PRN
  Administered 2018-10-29 – 2018-10-30 (×2): 650 mg via ORAL
  Filled 2018-10-29 (×2): qty 2

## 2018-10-29 MED ORDER — LORAZEPAM 2 MG/ML IJ SOLN
1.0000 mg | Freq: Four times a day (QID) | INTRAMUSCULAR | Status: DC
  Administered 2018-10-29 (×2): 1 mg via INTRAVENOUS
  Filled 2018-10-29 (×2): qty 1

## 2018-10-29 NOTE — Evaluation (Signed)
Physical Therapy Evaluation Patient Details Name: Edward [name] MRN: [id number] DOB: 09/27/1980 Today's Date: 10/29/2018   History of Present Illness  Pt admitted through ED following OD suicide attempt.  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  Current plan is for pt to progress to inpt psych unit.  Pt will benefit from acute stay PT to regain previous level of independence.    Follow Up Recommendations Other (comment)(Inpt psych unit)    Equipment Recommendations  None recommended by PT    Recommendations for Other Services       Precautions / Restrictions Precautions Precautions: Fall Restrictions Weight Bearing Restrictions: No      Mobility  Bed Mobility Overal bed mobility: Needs Assistance Bed Mobility: Supine to Sit     Supine to sit: Supervision     General bed mobility comments: Increased time with HOB elevated to 30 degrees.  Pt requiring no physical assist to transitioni supine to sit.  Transfers Overall transfer level: Needs assistance Equipment used: Rolling walker (2 wheeled) Transfers: Sit to/from Stand Sit to Stand: Min assist;Mod assist;From elevated surface         General transfer comment: cues for transition position and use of UEs to self assist.  min/mod assist to bring wt up and fwd and to balance in initial standind (posterior drift)  Ambulation/Gait Ambulation/Gait assistance: Min assist;Mod assist Gait Distance (Feet): 18 Feet Assistive device: Rolling walker (2 wheeled) Gait Pattern/deviations: Step-to pattern;Decreased step length - right;Decreased step length - left;Shuffle;Decreased stance time - left Gait velocity: decr   General Gait Details:  cues for posture and position from RW; Pt with c/o pain L foot lateral aspect. with WB.  Pt with slight roll over on L ankle and difficulty achieving good heel contact.  Stairs            Wheelchair Mobility    Modified Rankin (Stroke Patients Only)       Balance Overall balance assessment: Needs assistance Sitting-balance support: No upper extremity supported;Feet supported Sitting balance-Leahy Scale: Good     Standing balance support: Bilateral upper extremity supported Standing balance-Leahy Scale: Poor Standing balance comment: posterior drift                             Pertinent Vitals/Pain Pain Assessment: Faces Faces Pain Scale: Hurts even more Pain Location: L foot lateral aspect near ball of foot - with WB Pain Descriptors / Indicators: Grimacing Pain Intervention(s): Limited activity within patient's tolerance;Monitored during session    Home Living Family/patient expects to be discharged to:: Private residence Living Arrangements: Children(18 yr old son) Available Help at Discharge: Available PRN/intermittently Type of Home: House Home Access: Level entry     Home Layout: One level        Prior Function Level of Independence: Independent         Comments: Pt states ambulating sans AD in home and performing basic ADL     Hand Dominance        Extremity/Trunk Assessment   Upper Extremity Assessment Upper Extremity Assessment: RUE deficits/detail;LUE deficits/detail RUE Deficits / Details: Residual deficits from previous SCI - pt states he has better use of L UE.  Pt utilizing bil  UEs to self assist with bed mobility and WB on RW for ambulation RUE Coordination: decreased fine motor;decreased gross motor LUE Coordination: decreased fine motor;decreased gross motor    Lower Extremity Assessment Lower Extremity Assessment: RLE deficits/detail;LLE deficits/detail RLE Deficits / Details: Residual deficits Bil LEs from previous  Spinal cord injury.  Pt reports increased deficits L LE vs R.  Noted  decreased DF on L but pt WB bilat LEs for ambulation RLE Coordination: decreased fine motor;decreased gross motor LLE Coordination: decreased fine motor;decreased gross motor       Communication   Communication: No difficulties  Cognition Arousal/Alertness: Awake/alert Behavior During Therapy: WFL for tasks assessed/performed Overall Cognitive Status: Within Functional Limits for tasks assessed                                        General Comments      Exercises     Assessment/Plan    PT Assessment Patient needs continued PT services  PT Problem List Decreased strength;Decreased range of motion;Decreased activity tolerance;Decreased balance;Decreased mobility;Decreased knowledge of use of DME;Pain       PT Treatment Interventions DME instruction;Gait training;Functional mobility training;Therapeutic activities;Therapeutic exercise;Balance training;Neuromuscular re-education;Patient/family education    PT Goals (Current goals can be found in the Care Plan section)  Acute Rehab PT Goals Patient Stated Goal: Walk PT Goal Formulation: With patient Time For Goal Achievement: 11/12/18 Potential to Achieve Goals: Good    Frequency Min 3X/week   Barriers to discharge        Co-evaluation               AM-PAC PT "6 Clicks" Mobility  Outcome Measure Help needed turning from your back to your side while in a flat bed without using bedrails?: None Help needed moving from lying on your back to sitting on the side of a flat bed without using bedrails?: None Help needed moving to and from a bed to a chair (including a wheelchair)?: A Lot Help needed standing up from a chair using your arms (e.g., wheelchair or bedside chair)?: A Lot Help needed to walk in hospital room?: A Lot Help needed climbing 3-5 steps with a railing? : A Lot 6 Click Score: 16    End of Session Equipment Utilized  During Treatment: Gait belt Activity Tolerance: Patient tolerated treatment well Patient left: in chair;with call bell/phone within reach;with nursing/sitter in room Nurse Communication: Mobility status PT Visit Diagnosis: Difficulty in walking, not elsewhere classified (R26.2);Muscle weakness (generalized) (M62.81);Other abnormalities of gait and mobility (R26.89);Pain Pain - Right/Left: Left Pain - part of body: Ankle and joints of foot    Time: 4128-7867 PT Time Calculation (min) (ACUTE ONLY): 25 min   Charges:   PT Evaluation $PT Eval Low Complexity: 1 Low PT Treatments $Gait Training: 8-22 mins        Edward [name] PT Acute Rehabilitation Services Pager [phone] Office [phone]   Edward [name] 10/29/2018, 3:58 PM

## 2018-10-29 NOTE — Progress Notes (Signed)
Reason for consult: Altered mental status, increased muscle rigidity-concern for neuroleptic malignant syndrome   Subjective: He is alert and oriented x3.  Texting on his phone.  ROS: negative except above Examination  Vital signs in last 24 hours: Temp:  [98.4 F (36.9 C)-98.7 F (37.1 C)] 98.6 F (37 C) (04/09 1330) Pulse Rate:  [64-68] 66 (04/09 1330) Resp:  [16-18] 18 (04/09 1330) BP: (111-114)/(66-68) 113/68 (04/09 1330) SpO2:  [99 %] 99 % (04/09 1330)  General: lying in bed CVS: pulse-normal rate and rhythm RS: breathing comfortably Extremities: normal   Neuro: MS: Alert, oriented, follows commands CN: pupils equal and reactive,  EOMI, face symmetric, tongue midline, normal sensation over face, Motor: 4/5 strengths in all 4 extremities Reflexes: 4+ reflexes, patellar clonus in both legs, muscle tone is improved, however still has rigidity Coordination: normal Gait: not tested  Basic Metabolic Panel: Recent Labs  Lab 10/25/18 0322 10/26/18 0431 10/26/18 1024 10/27/18 0531 10/28/18 0531 10/29/18 0513  NA 143 144  --  145 140 139  K 3.6 3.5  --  3.8 2.8* 2.9*  CL 112* 112*  --  115* 107 108  CO2 18* 17*  --  20* 25 24  GLUCOSE 84 90  --  129* 126* 114*  BUN 23* 25*  --  17 10 10   CREATININE 0.75 0.78  --  0.61 0.62 0.57*  CALCIUM 9.7 9.3  --  9.2 8.7* 8.4*  MG  --   --  2.3 2.1  --   --     CBC: Recent Labs  Lab 10/23/18 0701 10/24/18 0306 10/25/18 0322 10/26/18 0431 10/27/18 0531 10/28/18 0531  WBC 10.5 12.8* 11.2* 9.5 8.7 6.8  NEUTROABS 7.7  --   --  7.7 6.7 4.5  HGB 12.8* 13.6 13.7 12.7* 11.4* 11.1*  HCT 40.1 42.1 41.1 39.8 35.6* 35.3*  MCV 100.0 100.5* 97.9 100.0 100.3* 100.9*  PLT 227 212 217 215 199 151     Coagulation Studies: No results for input(s): LABPROT, INR in the last 72 hours.   ASSESSMENT AND PLAN  Neuroleptic malignant syndrome -Continue Ativan, reduce frequency to every 6 hours -Continue baclofen, can add Robaxin to  help with spasticity -PT /OT -Continue to treat hypokalemia, rhabdomyolysis-CK has been trending downwards, no longer needed to be repeated  -Continue to treat opiate withdrawal  Please call neurology with further questions.  Will be available as needed.  Georgiana Spinner Aroor [facility] Pager Number [phone] For questions after 7pm please refer to AMION to reach the Neurologist on call

## 2018-10-29 NOTE — Progress Notes (Addendum)
PROGRESS NOTE    Edward [name]  ZOX:[id number] DOB: 05/21/1981 DOA: 10/23/2018 PCP: Medicine, [facility]    Brief Narrative:  38 year old male who presented after a narcotic overdose.  He does have significant past medical history for depression and spinal cord injury.  Patient was brought to the hospital due to Percocet overdose about 40 pills as a suicidal attempt.  He was noted to have abnormal behavior by the neighbors.  On his initial physical examination his blood pressure was 138/83, heart rate 84, temperature 98.6, respiratory rate 13, oxygen saturation 97%.  His lungs were clear to auscultation bilaterally, heart S1-S2 present and rhythmic, abdomen soft, no lower extremity edema, he was awake and alert.   Patient was admitted to the hospital working diagnosis of narcotic overdose/suicidal attempt.  While hospitalized patient developed neuroleptic malignant syndrome, opioid withdrawals.   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 Neuroleptic malignant syndrome, complicated with opiate and baclofen withdrawal. Continue to improve symptoms but not yet back to baseline, will decreased baclofen dose at 10 mg qid and will decrease frequency of lorazepam to every 6 H. Out of bed as tolerated and physical therapy evaluation. Will advance diet to regular.   2. Impending rhabdomyolysis with hypokalemia. CK continue trending down, renal function is preserved, will continue aggressive K correction with Kcl. Today's K 2,9, with serum cr at 0.57. Patient tolerating po well. Will hold on IV fluids for now.   2. Opiate overdose, suicidal attempt. Pending to be transferred to psych unit, when medically stable.   3. Depression/ chronic pain syndrome/  cervical injury. Pain seems to be well controlled.   4. Tobacco abuse. Nicotine patch.   DVT prophylaxis: enoxaparin   Code Status:  full Family Communication: no family at the bedside  Disposition Plan/ discharge barriers: pending physical therapy evaluation, before transfer to psychiatric unit.   Body mass index is 19.94 kg/m. Malnutrition Type:      Malnutrition Characteristics:      Nutrition Interventions:     RN Pressure Injury Documentation:     Consultants:   Neurology   Procedures:     Antimicrobials:       Subjective: Patient continue to feel better, improved po intake, no nausea or vomiting, no aches and pains. He has not been out of bed yet.   Objective: Vitals:   10/28/18 0807 10/28/18 1743 10/28/18 2101 10/29/18 0532  BP: 117/73 113/78 111/66 114/67  Pulse: 64 76 68 64  Resp:   16 16  Temp: 98.1 F (36.7 C) 98.7 F (37.1 C) 98.7 F (37.1 C) 98.4 F (36.9 C)  TempSrc: Oral Oral Oral Oral  SpO2: 99% 97% 99% 99%  Weight:      Height:        Intake/Output Summary (Last 24 hours) at 10/29/2018 1115 Last data filed at 10/29/2018 0900 Gross per 24 hour  Intake 1965.88 ml  Output 1400 ml  Net 565.88 ml   Filed Weights   10/26/18 1059  Weight: 66.7 kg    Examination:   General: Not in pain or dyspnea, deconditioned  Neurology: Awake and alert, non focal  E ENT: mild pallor, no icterus, oral mucosa moist Cardiovascular: No JVD. S1-S2 present, rhythmic, no gallops, rubs, or  murmurs. No lower extremity edema. Pulmonary: positive breath sounds bilaterally, adequate air movement, no wheezing, rhonchi or rales. Gastrointestinal. Abdomen with no organomegaly, non tender, no rebound or guarding Skin. No rashes Musculoskeletal: no joint deformities     Data Reviewed: I have personally reviewed following labs and imaging studies  CBC: Recent Labs  Lab 10/23/18 0701 10/24/18 0306 10/25/18 0322 10/26/18 0431 10/27/18 0531  10/28/18 0531  WBC 10.5 12.8* 11.2* 9.5 8.7 6.8  NEUTROABS 7.7  --   --  7.7 6.7 4.5  HGB 12.8* 13.6 13.7 12.7* 11.4* 11.1*  HCT 40.1 42.1 41.1 39.8 35.6* 35.3*  MCV 100.0 100.5* 97.9 100.0 100.3* 100.9*  PLT 227 212 217 215 199 151   Basic Metabolic Panel: Recent Labs  Lab 10/25/18 0322 10/26/18 0431 10/26/18 1024 10/27/18 0531 10/28/18 0531 10/29/18 0513  NA 143 144  --  145 140 139  K 3.6 3.5  --  3.8 2.8* 2.9*  CL 112* 112*  --  115* 107 108  CO2 18* 17*  --  20* 25 24  GLUCOSE 84 90  --  129* 126* 114*  BUN 23* 25*  --  CREATININE 0.75 0.78  --  0.61 0.62 0.57*  CALCIUM 9.7 9.3  --  9.2 8.7* 8.4*  MG  --   --  2.3 2.1  --   --    GFR: Estimated Creatinine Clearance: 118.1 mL/min (A) (by C-G formula based on SCr of 0.57 mg/dL (L)). Liver Function Tests: Recent Labs  Lab 10/24/18 0708 10/25/18 0322 10/26/18 0431 10/27/18 0531 10/28/18 0531  AST 22 37 34 44* 33  ALT 33 39  ALKPHOS 48 54 48 48 43  BILITOT 1.3* 1.5* 1.7* 1.6* 1.1  PROT 7.4 7.9 7.4 7.3 6.4*  ALBUMIN 4.3 4.8 4.4 4.1 3.7   No results for input(s): LIPASE, AMYLASE in the last 168 hours. Recent Labs  Lab 10/27/18 1853  AMMONIA 27   Coagulation Profile: Recent Labs  Lab 10/23/18 0801 10/24/18 0708 10/25/18 0322 10/26/18 0431  INR 1.2 1.4* 1.2 1.2   Cardiac Enzymes: Recent Labs  Lab 10/26/18 1024 10/27/18 0531 10/27/18 1853 10/28/18 0531 10/29/18 0513  CKTOTAL 1,015* 1,452* 952* 579* 243   BNP (last 3 results) No results for input(s): PROBNP in the last 8760 hours. HbA1C: No results for input(s): HGBA1C in the last 72 hours. CBG: Recent Labs  Lab 10/28/18 0813 10/28/18 1139 10/28/18 1605 10/28/18 2126 10/29/18 0728  GLUCAP 121* 113* 121* 127* 101*   Lipid Profile: No results for input(s): CHOL, HDL, LDLCALC, TRIG, CHOLHDL, LDLDIRECT in the last 72 hours. Thyroid Function Tests: No results for input(s): TSH, T4TOTAL, FREET4, T3FREE, THYROIDAB in the  last 72 hours. Anemia Panel: No results for input(s): VITAMINB12, FOLATE, FERRITIN, TIBC, IRON, RETICCTPCT in the last 72 hours.    Radiology Studies: I have reviewed all of the imaging during this hospital visit personally     Scheduled Meds: . baclofen  30 mg Oral QID  . bisacodyl  10 mg Rectal Daily  . cloNIDine  0.1 mg Oral Daily  . enoxaparin (LOVENOX) injection  40 mg Subcutaneous Q24H  . gabapentin  600 mg Oral QID  . LORazepam  1 mg Intravenous Q6H  . nicotine  21 mg Transdermal Daily  . pantoprazole  40 mg Oral QHS  . potassium chloride  40 mEq Oral Q4H  . senna-docusate  2 tablet Oral QODAY  . sodium chloride flush  10-40 mL Intracatheter Q12H   Continuous Infusions:   LOS: 6 days        Mauricio Annett Gula, MD

## 2018-10-30 LAB — CULTURE, BLOOD (ROUTINE X 2)
Culture: NO GROWTH
Culture: NO GROWTH
Special Requests: ADEQUATE
Special Requests: ADEQUATE

## 2018-10-30 LAB — BASIC METABOLIC PANEL
Anion gap: 6 (ref 5–15)
BUN: 11 mg/dL (ref 6–20)
CO2: 25 mmol/L (ref 22–32)
Calcium: 8.9 mg/dL (ref 8.9–10.3)
Chloride: 109 mmol/L (ref 98–111)
Creatinine, Ser: 0.56 mg/dL — ABNORMAL LOW (ref 0.61–1.24)
GFR calc Af Amer: >60 mL/min (ref >60)
GFR calc non Af Amer: >60 mL/min (ref >60)
Glucose, Bld: 103 mg/dL — ABNORMAL HIGH (ref 70–99)
Potassium: 3.7 mmol/L (ref 3.5–5.1)
Sodium: 140 mmol/L (ref 135–145)

## 2018-10-30 MED ORDER — BACLOFEN 10 MG PO TABS: 10.0000 mg | ORAL_TABLET | Freq: Four times a day (QID) | ORAL | 0 refills | Status: DC

## 2018-10-30 MED ORDER — GABAPENTIN 300 MG PO CAPS: 600.0000 mg | ORAL_CAPSULE | Freq: Three times a day (TID) | ORAL | 0 refills | Status: DC

## 2018-10-30 MED ORDER — LORAZEPAM 1 MG PO TABS
1.0000 mg | ORAL_TABLET | Freq: Three times a day (TID) | ORAL | Status: DC
  Administered 2018-10-30 – 2018-10-31 (×4): 1 mg via ORAL
  Filled 2018-10-30 (×4): qty 1

## 2018-10-30 MED ORDER — LORAZEPAM 1 MG PO TABS: 1.0000 mg | ORAL_TABLET | Freq: Three times a day (TID) | ORAL | 0 refills | Status: DC

## 2018-10-30 NOTE — Progress Notes (Signed)
Lab in room drawing blood  

## 2018-10-30 NOTE — Progress Notes (Signed)
Physician Discharge Summary  Edward [name]:[id number] DOB: April 25, 1981 DOA: 10/23/2018  PCP: Medicine, [facility]  Admit date: 10/23/2018 Discharge date: 10/30/2018  Admitted From: Home  Disposition:  Inpatient psychiatric unit.   Recommendations for Outpatient Follow-up and new medication changes:  1. Follow up with Medicine, [facility] on week after discharge.  2. Patient placed on lorazepam 1 mg tid for withdrawals, continue slow taper.  3. Muscle relaxant, baclofen has been decreased to 10 mg tid, to prevent side effects. 4. Gabapentin decreased to 600 mg tid.  5. Patient is medically stable to be transferred to inpatient psychiatry.    Home Health: no   Equipment/Devices: walker    Discharge Condition: stable  CODE STATUS:full  Diet recommendation: Regular.   Brief/Interim Summary: 38 year old male who presented after opiod overdose. He does have significant past medical history for depression andspinal cord injury.Patient was brought to the hospital due to Percocet overdose about 40 pills as a suicidal attempt. He was noted to have abnormal behavior by the neighbors. On his initial physical examination his blood pressure was 138/83, heart rate 84, temperature 98.6, respiratory rate 13, oxygen saturation 97%. His lungs wereclear to auscultation bilaterally, heart S1-S2 present and rhythmic, abdomen soft, no lower extremity edema, he was awake and alert. Sodium 139, potassium 3.1, chloride 107, bicarb 22, glucose 120, BUN 19, creatinine 0.92, AST 14, ALT 11, white count 10.5, hemoglobin 12.8, hematocrit 40.1, platelets 227, acetaminophen level 143, salicylate less than 7, urineanalysis had 6-10 white cells, more than 50 red cells, his drug screen was positive for opiates.  His head CT was negative for acute changes.  His chest x-ray was negative for infiltrates.  His EKG had 113 bpm, normal axis, normal intervals, no significant ST segment  or T wave changes, positive LVH. (V1-V2 rR)  Patient was admitted to the hospital working diagnosis of opiate overdose/suicidal attempt.  1.  Opiate overdose, suicidal attempt.  Patient was admitted to the medical ward, he was placed on suicide precautions, one-to-one sitter.  He was seen by psychiatry with recommendations to transfer to inpatient psychiatry once medically stable.    2.  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  Patient received benzodiazepines and Haldol for his withdrawal symptoms.  He was noted to develop worsening mental status, increased muscle rigidity, fever and elevated CKs, he was diagnosed with neuroleptic malignant syndrome.  Antipsychotics were discontinued, with slow improvement of his symptoms.  He has been seen by physical therapy, with no follow-up recommended.  Patient usually uses a walker for ambulation.  3.  Impending rhabdomyolysis with hypokalemia.  Likely due to neuroleptic malignant syndrome, his peak CK was 1,452, he received supportive medical care including intravenous fluids, his kidney function remained stable with a discharge creatinine 0.56.  His CK has been trending down, at discharge 243.  Potassium was corrected with potassium chloride, discharge potassium is 3.7  4.  Depression/chronic pain syndrome, cervical injury.  His muscle relaxant dose has been decreased to baclofen 10 mg 3 times daily, to avoid side effects.  Continue gabapentin 600 3 times daily.  Further psychiatric medications per inpatient psychiatric unit.  5.  Tobacco abuse.  Continue smoking cessation, patient received nicotine patch while hospitalized.  6.  Acetaminophen overdose.  Patient received N-acetylcysteine per protocol, no liver failure, follow-up acetaminophen level was less than 10.  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 [name])   Autonomic  dysreflexia   Reactive depression   Suicidal ideation   NMS (neuroleptic malignant syndrome): Possible    Discharge Instructions   Allergies as of 10/30/2018      Reactions   Amoxicillin Itching, Rash, Other (See Comments)   Penicillins Itching, Rash, Other (See Comments)   Has patient had a PCN reaction causing immediate rash, facial/tongue/throat swelling, SOB or lightheadedness with hypotension: Yes Has patient had a PCN reaction causing severe rash involving mucus membranes or skin necrosis: No Has patient had a PCN reaction that required hospitalization: No Has patient had a PCN reaction occurring within the last 10 years: No If all of the above answers are "NO", then may proceed with Cephalosporin use. Has patient had a PCN reaction causing immediate rash, facial/tongue/throat swelling, SOB or lightheadedness with hypotension: Yes Has patient had a PCN reaction causing severe rash involving mucus membranes or skin necrosis: No Has patient had a PCN reaction that required hospitalization: No Has patient had a PCN reaction occurring within the last 10 years: No If all of the above answers are "NO", then may proceed with Cephalosporin use.   Clavulanic Acid Other (See Comments)   Urecholine [bethanechol] Itching   Itching, swelling, sweating (hot flashes)   Ambien [zolpidem Tartrate] Other (See Comments)   hallucinations   Amoxicillin Rash   Penicillins Rash      Medication List    STOP taking these medications   acetaminophen 500 MG tablet Commonly known as:  TYLENOL   buPROPion 75 MG tablet Commonly known as:  Wellbutrin   diclofenac 75 MG EC tablet Commonly known as:  VOLTAREN   oxyCODONE-acetaminophen 7.5-325 MG tablet Commonly known as:  Percocet   tiZANidine 4 MG tablet Commonly known as:  ZANAFLEX     TAKE these medications   baclofen 10 MG tablet Commonly known as:  LIORESAL Take 1 tablet (10 mg total) by mouth 4 (four) times daily. What changed:     medication strength  how much to take  how to take this  when to take this  additional instructions   gabapentin 300 MG capsule Commonly known as:  NEURONTIN Take 2 capsules (600 mg total) by mouth 3 (three) times daily for 30 days. What changed:  when to take this   LORazepam 1 MG tablet Commonly known as:  ATIVAN Take 1 tablet (1 mg total) by mouth 3 (three) times daily.   Melatonin 3 MG Tabs Take 2 tablets (6 mg total) by mouth at bedtime. What changed:    when to take this  reasons to take this   pantoprazole 20 MG tablet Commonly known as:  PROTONIX TAKE 1 TABLET(20 MG) BY MOUTH AT BEDTIME What changed:    how much to take  how to take this  when to take this   polyethylene glycol 17 g packet Commonly known as:  MIRALAX / GLYCOLAX Take 17 g by mouth daily. What changed:    when to take this  reasons to take this   senna-docusate 8.6-50 MG tablet Commonly known as:  Senokot-S Take 2 tablets by mouth every other day. What changed:    how much to take  when to take this  reasons to take this       Allergies  Allergen Reactions  . Amoxicillin Itching, Rash and Other (See Comments)  . Penicillins Itching, Rash and Other (See Comments)    Has patient had a PCN reaction causing immediate rash, facial/tongue/throat  swelling, SOB or lightheadedness with hypotension: Yes Has patient had a PCN reaction causing severe rash involving mucus membranes or skin necrosis: No Has patient had a PCN reaction that required hospitalization: No Has patient had a PCN reaction occurring within the last 10 years: No If all of the above answers are "NO", then may proceed with Cephalosporin use. Has patient had a PCN reaction causing immediate rash, facial/tongue/throat swelling, SOB or lightheadedness with hypotension: Yes Has patient had a PCN reaction causing severe rash involving mucus membranes or skin necrosis: No Has patient had a PCN reaction that required  hospitalization: No Has patient had a PCN reaction occurring within the last 10 years: No If all of the above answers are "NO", then may proceed with Cephalosporin use.   . Clavulanic Acid Other (See Comments)  . Urecholine [Bethanechol] Itching    Itching, swelling, sweating (hot flashes)  . Ambien [Zolpidem Tartrate] Other (See Comments)    hallucinations  . Amoxicillin Rash  . Penicillins Rash    Consultations:  Psychiatry   Neurology    Procedures/Studies: Ct Head Wo Contrast  Result Date: 10/27/2018 CLINICAL DATA:  Overdose. EXAM: CT HEAD WITHOUT CONTRAST TECHNIQUE: Contiguous axial images were obtained from the base of the skull through the vertex without intravenous contrast. COMPARISON:  None. FINDINGS: Brain: There is no evidence of acute infarct, intracranial hemorrhage, mass, midline shift, or extra-axial fluid collection. The ventricles and sulci are normal. Vascular: No hyperdense vessel. Skull: No fracture or focal osseous lesion. Sinuses/Orbits: Minimal posterior left ethmoid air cell mucosal thickening. Clear mastoid air cells. Unremarkable orbits. Other: None. IMPRESSION: Negative head CT. Electronically Signed   By: Sebastian Ache M.D.   On: 10/27/2018 17:05   Dg Chest Port 1 View  Result Date: 10/25/2018 CLINICAL DATA:  Overdose on opioids. Patient combative and would not remove necklace. EXAM: PORTABLE CHEST 1 VIEW COMPARISON:  10/22/2018 FINDINGS: Lungs are adequately inflated without consolidation or effusion. Cardiomediastinal silhouette and remainder of the exam is unchanged. IMPRESSION: No active disease. Electronically Signed   By: Elberta Fortis M.D.   On: 10/25/2018 14:56   Dg Abd Acute 2+v W 1v Chest  Result Date: 10/22/2018 CLINICAL DATA:  Abdominal pain, constipation EXAM: DG ABDOMEN ACUTE W/ 1V CHEST COMPARISON:  None. FINDINGS: Lungs are clear.  No pleural effusion or pneumothorax. The heart is normal in size. Nonobstructive bowel gas pattern. No evidence of  free air under the diaphragm on the upright view. Mild left colonic stool burden. Visualized osseous structures are within normal limits. IMPRESSION: No evidence of acute cardiopulmonary disease. No evidence of small bowel obstruction or free air. Mild left colonic stool burden, within normal limits. Electronically Signed   By: Charline Bills M.D.   On: 10/22/2018 03:05       Subjective: Patient is feeling better, improving muscle cramps, no nausea or vomiting, no chest pain or dyspnea. Ambulated with walker yesterday with the supervision of physical therapy.   Discharge Exam: Vitals:   10/29/18 2127 10/30/18 0549  BP: 118/65 (!) 119/57  Pulse: (!) 57 (!) 58  Resp: 18 18  Temp: 99.3 F (37.4 C) 99.1 F (37.3 C)  SpO2: 98% 100%   Vitals:   10/29/18 0532 10/29/18 1330 10/29/18 2127 10/30/18 0549  BP: 114/67 113/68 118/65 (!) 119/57  Pulse: 64 66 (!) 57 (!) 58  Resp: Temp: 98.4 F (36.9 C) 98.6 F (37 C) 99.3 F (37.4 C) 99.1 F (37.3 C)  TempSrc:  Oral Oral Oral Oral  SpO2: 99% 99% 98% 100%  Weight:      Height:        General: Not in pain or dyspnea.  Neurology: Awake and alert, non focal  E ENT: mild pallor, no icterus, oral mucosa moist Cardiovascular: No JVD. S1-S2 present, rhythmic, no gallops, rubs, or murmurs. No lower extremity edema. Pulmonary: positive breath sounds bilaterally, adequate air movement, no wheezing, rhonchi or rales. Gastrointestinal. Abdomen with no organomegaly, non tender, no rebound or guarding Skin. No rashes Musculoskeletal: no joint deformities   The results of significant diagnostics from this hospitalization (including imaging, microbiology, ancillary and laboratory) are listed below for reference.     Microbiology: Recent Results (from the past 240 hour(s))  Culture, Urine     Status: None   Collection Time: 10/25/18  9:03 AM  Result Value Ref Range Status   Specimen Description   Final    URINE, RANDOM Performed  at [facility], [address] [place], [place] [address]    Special Requests   Final    NONE Performed at [facility], [address] [place], [place] [address]    Culture   Final    NO GROWTH Performed at [facility], [address] [place], [place] [address]    Report Status 10/26/2018 FINAL  Final  Culture, blood (Routine X 2) w Reflex to ID Panel     Status: None (Preliminary result)   Collection Time: 10/25/18  2:36 PM  Result Value Ref Range Status   Specimen Description   Final    BLOOD LEFT ANTECUBITAL Performed at [facility], [address] [place], [place] [address]    Special Requests   Final    BOTTLES DRAWN AEROBIC AND ANAEROBIC Blood Culture adequate volume Performed at [facility], [address] [place], [place] [address]    Culture   Final    NO GROWTH 4 DAYS Performed at [facility], [address] [place], [place] [address]    Report Status PENDING  Incomplete  Culture, blood (Routine X 2) w Reflex to ID Panel     Status: None (Preliminary result)   Collection Time: 10/25/18  2:37 PM  Result Value Ref Range Status   Specimen Description   Final    BLOOD LEFT HAND Performed at [facility], [address] [place], [place] [address]    Special Requests   Final    BOTTLES DRAWN AEROBIC ONLY Blood Culture adequate volume Performed at [facility], [address] [place], [place] [address]    Culture   Final    NO GROWTH 4 DAYS Performed at [facility], [address] [place], [place] [address]    Report Status PENDING  Incomplete     Labs: BNP (last 3 results) No results for input(s): BNP in the last 8760 hours. Basic Metabolic Panel: Recent Labs  Lab 10/25/18 0322 10/26/18 0431 10/26/18 1024 10/27/18 0531 10/28/18 0531 10/29/18 0513  NA 143 144  --  145 140 139  K 3.6 3.5  --  3.8 2.8* 2.9*  CL  112* 112*  --  115* 107 108  CO2 18* 17*  --  20* 25 24  GLUCOSE 84 90  --  129* 126* 114*  BUN 23* 25*  --  17 10 10   CREATININE 0.75 0.78  --  0.61 0.62 0.57*  CALCIUM 9.7  9.3  --  9.2 8.7* 8.4*  MG  --   --  2.3 2.1  --   --    Liver Function Tests: Recent Labs  Lab 10/24/18 0708 10/25/18 0322 10/26/18 0431 10/27/18 0531 10/28/18 0531  AST 22 37 34 44* 33  ALT 16 19 23  33 39  ALKPHOS 48 54 48 48 43  BILITOT 1.3* 1.5* 1.7* 1.6* 1.1  PROT 7.4 7.9 7.4 7.3 6.4*  ALBUMIN 4.3 4.8 4.4 4.1 3.7   No results for input(s): LIPASE, AMYLASE in the last 168 hours. Recent Labs  Lab 10/27/18 1853  AMMONIA 27   CBC: Recent Labs  Lab 10/24/18 0306 10/25/18 0322 10/26/18 0431 10/27/18 0531 10/28/18 0531  WBC 12.8* 11.2* 9.5 8.7 6.8  NEUTROABS  --   --  7.7 6.7 4.5  HGB 13.6 13.7 12.7* 11.4* 11.1*  HCT 42.1 41.1 39.8 35.6* 35.3*  MCV 100.5* 97.9 100.0 100.3* 100.9*  PLT 212 217 215 199 151   Cardiac Enzymes: Recent Labs  Lab 10/26/18 1024 10/27/18 0531 10/27/18 1853 10/28/18 0531 10/29/18 0513  CKTOTAL 1,015* 1,452* 952* 579* 243   BNP: Invalid input(s): POCBNP CBG: Recent Labs  Lab 10/28/18 0813 10/28/18 1139 10/28/18 1605 10/28/18 2126 10/29/18 0728  GLUCAP 121* 113* 121* 127* 101*   D-Dimer No results for input(s): DDIMER in the last 72 hours. Hgb A1c No results for input(s): HGBA1C in the last 72 hours. Lipid Profile No results for input(s): CHOL, HDL, LDLCALC, TRIG, CHOLHDL, LDLDIRECT in the last 72 hours. Thyroid function studies No results for input(s): TSH, T4TOTAL, T3FREE, THYROIDAB in the last 72 hours.  Invalid input(s): FREET3 Anemia work up No results for input(s): VITAMINB12, FOLATE, FERRITIN, TIBC, IRON, RETICCTPCT in the last 72 hours. Urinalysis    Component Value Date/Time   COLORURINE YELLOW 10/25/2018 0903   APPEARANCEUR HAZY (A) 10/25/2018 0903   LABSPEC 1.026 10/25/2018 0903   PHURINE 5.0 10/25/2018 0903   GLUCOSEU NEGATIVE  10/25/2018 0903   HGBUR MODERATE (A) 10/25/2018 0903   BILIRUBINUR NEGATIVE 10/25/2018 0903   KETONESUR 80 (A) 10/25/2018 0903   PROTEINUR 30 (A) 10/25/2018 0903   NITRITE NEGATIVE 10/25/2018 0903   LEUKOCYTESUR NEGATIVE 10/25/2018 0903   Sepsis Labs Invalid input(s): PROCALCITONIN,  WBC,  LACTICIDVEN Microbiology Recent Results (from the past 240 hour(s))  Culture, Urine     Status: None   Collection Time: 10/25/18  9:03 AM  Result Value Ref Range Status   Specimen Description   Final    URINE, RANDOM Performed at [facility], [address] [place], [place] [address]    Special Requests   Final    NONE Performed at [facility], [address], [place], [place] [address]    Culture   Final    NO GROWTH Performed at [facility], [address] [place], [place] [address]    Report Status 10/26/2018 FINAL  Final  Culture, blood (Routine X 2) w Reflex to ID Panel     Status: None (Preliminary result)   Collection Time: 10/25/18  2:36 PM  Result Value Ref Range Status   Specimen Description   Final    BLOOD LEFT ANTECUBITAL Performed at [facility], [address] [place], [place] [address]    Special Requests   Final    BOTTLES DRAWN AEROBIC AND ANAEROBIC Blood Culture adequate volume Performed at [facility], [address], [place], [place] [address]  Culture   Final    NO GROWTH 4 DAYS Performed at [facility], [address] [place], [place] [address]    Report Status PENDING  Incomplete  Culture, blood (Routine X 2) w Reflex to ID Panel     Status: None (Preliminary result)   Collection Time: 10/25/18  2:37 PM  Result Value Ref Range Status   Specimen Description   Final    BLOOD LEFT HAND Performed at [facility], [address] [place], [place] [address]    Special Requests   Final    BOTTLES DRAWN AEROBIC ONLY Blood Culture adequate  volume Performed at [facility], [address], [place], [place] [address]    Culture   Final    NO GROWTH 4 DAYS Performed at [facility], [address] [place], [place] [address]    Report Status PENDING  Incomplete     Time coordinating discharge: 45 minutes  SIGNED:   Coralie Keens, MD  [facility] 10/30/2018, 8:47 AM

## 2018-10-30 NOTE — Progress Notes (Signed)
Pt eating dinner tray °

## 2018-10-30 NOTE — Progress Notes (Signed)
Pt resting, watching tv.

## 2018-10-30 NOTE — Progress Notes (Signed)
Nurse giving pt medications  

## 2018-10-30 NOTE — Progress Notes (Signed)
Nurse giving pt medications

## 2018-10-30 NOTE — Progress Notes (Signed)
Physical Therapy Treatment Patient Details Name: Edward [name] MRN: [id number] DOB: 11/26/1980 Today's Date: 10/30/2018    History of Present Illness Pt admitted through ED following OD suicide attempt.  Pt with hx of C1-C4 spinal cord injury with significant residual deficits    PT Comments    Pt progressing with all aspects of mobility but continues to require min assist to move to standing and achieve initial balance and intermittent min assist for balance with ambulation.  Follow Up Recommendations  Other (comment)     Equipment Recommendations  None recommended by PT    Recommendations for Other Services       Precautions / Restrictions Precautions Precautions: Fall Restrictions Weight Bearing Restrictions: No    Mobility  Bed Mobility Overal bed mobility: Needs Assistance Bed Mobility: Sit to Supine     Supine to sit: Supervision Sit to supine: Supervision   General bed mobility comments: Pt unassisted sit to supine  Transfers Overall transfer level: Needs assistance Equipment used: Rolling walker (2 wheeled) Transfers: Sit to/from Stand Sit to Stand: Min assist         General transfer comment: cues for transition position and use of UEs to self assist.  min assist to bring wt up and fwd and to balance in initial standind (posterior drift)  Ambulation/Gait Ambulation/Gait assistance: Min assist;Min guard Gait Distance (Feet): 75 Feet Assistive device: Rolling walker (2 wheeled) Gait Pattern/deviations: Step-to pattern;Decreased step length - right;Decreased step length - left;Shuffle;Decreased stance time - left Gait velocity: decr   General Gait Details: cues for posture and position from RW; Pt with c/o pain L foot lateral aspect. with WB.  Pt with slight roll over on L ankle and difficulty achieving good heel contact..  Pt with multiple episodes of gross L LE tremor noted with increased fatigue    Stairs             Wheelchair Mobility    Modified Rankin (Stroke Patients Only)       Balance Overall balance assessment: Needs assistance Sitting-balance support: No upper extremity supported;Feet supported Sitting balance-Leahy Scale: Good     Standing balance support: Bilateral upper extremity supported Standing balance-Leahy Scale: Poor Standing balance comment: posterior drift                            Cognition Arousal/Alertness: Awake/alert Behavior During Therapy: WFL for tasks assessed/performed Overall Cognitive Status: Within Functional Limits for tasks assessed                                        Exercises General Exercises - Lower Extremity Ankle Circles/Pumps: AROM;Both;5 reps;Supine(ankle pumps with hold in DF to work on stretching heel cords)    General Comments        Pertinent Vitals/Pain Pain Assessment: 0-10 Pain Score: 3  Pain Location: L foot lateral aspect near ball of foot - with WB Pain Descriptors / Indicators: Grimacing;Sore Pain Intervention(s): Limited activity within patient's tolerance;Monitored during session    Home Living                      Prior Function            PT Goals (current goals can now be found in the care plan section) Acute Rehab PT Goals Patient Stated Goal: Walk PT Goal Formulation: With patient  Time For Goal Achievement: 11/12/18 Potential to Achieve Goals: Good Progress towards PT goals: Progressing toward goals    Frequency    Min 3X/week      PT Plan Current plan remains appropriate    Co-evaluation              AM-PAC PT "6 Clicks" Mobility   Outcome Measure  Help needed turning from your back to your side while in a flat bed without using bedrails?: None Help needed moving from lying on your back to sitting on the side of a flat bed without using bedrails?: None Help needed moving to and from a bed to a chair (including a wheelchair)?: A Little Help needed standing up from a chair  using your arms (e.g., wheelchair or bedside chair)?: A Little Help needed to walk in hospital room?: A Little Help needed climbing 3-5 steps with a railing? : A Lot 6 Click Score: 19    End of Session Equipment Utilized During Treatment: Gait belt Activity Tolerance: Patient tolerated treatment well Patient left: in bed;with call bell/phone within reach;with nursing/sitter in room Nurse Communication: Mobility status PT Visit Diagnosis: Difficulty in walking, not elsewhere classified (R26.2);Muscle weakness (generalized) (M62.81);Other abnormalities of gait and mobility (R26.89);Pain Pain - Right/Left: Left Pain - part of body: Ankle and joints of foot     Time: 0712-1975 PT Time Calculation (min) (ACUTE ONLY): 18 min  Charges:  $Gait Training: 8-22 mins                     Edward [name] PT Acute Rehabilitation Services Pager [phone] Office [phone]    Edward [name] 10/30/2018, 1:52 PM

## 2018-10-30 NOTE — Progress Notes (Signed)
Pt breakfast tray ordered.

## 2018-10-30 NOTE — Progress Notes (Signed)
Pt walked down hallway with assistance from PT and two wheel walker. Pt now resting in recliner.

## 2018-10-30 NOTE — Progress Notes (Signed)
Physical Therapy Treatment Patient Details Name: Edward [name] MRN: [id number] DOB: 1981/06/29 Today's Date: 10/30/2018    History of Present Illness Pt admitted through ED following OD suicide attempt.  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   Follow Up Recommendations  Other (comment)     Equipment Recommendations  None recommended by PT    Recommendations for Other Services       Precautions / Restrictions Precautions Precautions: Fall Restrictions Weight Bearing Restrictions: No    Mobility  Bed Mobility Overal bed mobility: Needs Assistance Bed Mobility: Supine to Sit     Supine to sit: Supervision     General bed mobility comments: Increased time with HOB elevated to 30 degrees.  Pt requiring no physical assist to transitioni supine to sit.  Transfers Overall transfer level: Needs assistance Equipment used: Rolling walker (2 wheeled) Transfers: Sit to/from Stand Sit to Stand: Min assist;Mod assist         General transfer comment: cues for transition position and use of UEs to self assist.  min/mod assist to bring wt up and fwd and to balance in initial standind (posterior drift)  Ambulation/Gait Ambulation/Gait assistance: Min assist Gait Distance (Feet): 47 Feet Assistive device: Rolling walker (2 wheeled) Gait Pattern/deviations: Step-to pattern;Decreased step length - right;Decreased step length - left;Shuffle;Decreased stance time - left Gait velocity: decr   General Gait Details: cues for posture and position from RW; Pt with c/o pain L foot lateral aspect. with WB.  Pt with slight roll over on L ankle and difficulty achieving good heel contact.   Stairs             Wheelchair Mobility    Modified Rankin  (Stroke Patients Only)       Balance Overall balance assessment: Needs assistance Sitting-balance support: No upper extremity supported;Feet supported Sitting balance-Leahy Scale: Good     Standing balance support: Bilateral upper extremity supported Standing balance-Leahy Scale: Poor Standing balance comment: posterior drift                            Cognition Arousal/Alertness: Awake/alert Behavior During Therapy: WFL for tasks assessed/performed Overall Cognitive Status: Within Functional Limits for tasks assessed                                        Exercises      General Comments        Pertinent Vitals/Pain Pain Assessment: 0-10 Pain Score: 3  Pain Location: L foot lateral aspect near ball of foot - with WB Pain Descriptors / Indicators: Grimacing;Sore Pain Intervention(s): Limited activity within patient's tolerance;Monitored during session;Premedicated before session    Home Living                      Prior Function            PT Goals (current goals can now be found in the care plan section) Acute Rehab PT Goals Patient Stated Goal: Walk PT Goal Formulation: With patient Time For Goal Achievement: 11/12/18 Potential to Achieve Goals: Good Progress towards PT goals: Progressing toward goals  Frequency    Min 3X/week      PT Plan Current plan remains appropriate    Co-evaluation              AM-PAC PT "6 Clicks" Mobility   Outcome Measure  Help needed turning from your back to your side while in a flat bed without using bedrails?: None Help needed moving from lying on your back to sitting on the side of a flat bed without using bedrails?: None Help needed moving to and from a bed to a chair (including a wheelchair)?: A Lot Help needed standing up from a chair using your arms (e.g., wheelchair or bedside chair)?: A Lot Help needed to walk in hospital room?: A Little Help needed climbing 3-5  steps with a railing? : A Lot 6 Click Score: 17    End of Session Equipment Utilized During Treatment: Gait belt Activity Tolerance: Patient tolerated treatment well Patient left: in chair;with call bell/phone within reach;with nursing/sitter in room Nurse Communication: Mobility status PT Visit Diagnosis: Difficulty in walking, not elsewhere classified (R26.2);Muscle weakness (generalized) (M62.81);Other abnormalities of gait and mobility (R26.89);Pain Pain - Right/Left: Left Pain - part of body: Ankle and joints of foot     Time: [id number] PT Time Calculation (min) (ACUTE ONLY): 22 min  Charges:  $Gait Training: 8-22 mins                     Edward [name],Edward [name] 10/30/2018, 12:37 PM

## 2018-10-30 NOTE — Progress Notes (Signed)
Pt eating lunch tray  

## 2018-10-31 ENCOUNTER — Encounter (HOSPITAL_COMMUNITY): Payer: Self-pay

## 2018-10-31 ENCOUNTER — Inpatient Hospital Stay (HOSPITAL_COMMUNITY)
Admission: AD | Admit: 2018-10-31 | Discharge: 2018-11-02 | DRG: 885 | Disposition: A | Discharge status: Home / Self Care | Payer: Medicaid Other | Source: Intra-hospital | Attending: Psychiatry | Admitting: Psychiatry

## 2018-10-31 ENCOUNTER — Other Ambulatory Visit: Payer: Self-pay

## 2018-10-31 ENCOUNTER — Other Ambulatory Visit: Payer: Self-pay | Admitting: Family

## 2018-10-31 DIAGNOSIS — K219 Gastro-esophageal reflux disease without esophagitis: Secondary | ICD-10-CM | POA: Diagnosis present

## 2018-10-31 DIAGNOSIS — R45851 Suicidal ideations: Secondary | ICD-10-CM | POA: Diagnosis not present

## 2018-10-31 DIAGNOSIS — Z880 Allergy status to penicillin: Secondary | ICD-10-CM | POA: Diagnosis not present

## 2018-10-31 DIAGNOSIS — Z79899 Other long term (current) drug therapy: Secondary | ICD-10-CM | POA: Diagnosis not present

## 2018-10-31 DIAGNOSIS — Z79891 Long term (current) use of opiate analgesic: Secondary | ICD-10-CM | POA: Diagnosis not present

## 2018-10-31 DIAGNOSIS — F419 Anxiety disorder, unspecified: Secondary | ICD-10-CM | POA: Diagnosis present

## 2018-10-31 DIAGNOSIS — R450 Nervousness: Secondary | ICD-10-CM | POA: Diagnosis not present

## 2018-10-31 DIAGNOSIS — Z888 Allergy status to other drugs, medicaments and biological substances status: Secondary | ICD-10-CM | POA: Diagnosis not present

## 2018-10-31 DIAGNOSIS — G4700 Insomnia, unspecified: Secondary | ICD-10-CM | POA: Diagnosis present

## 2018-10-31 DIAGNOSIS — Z915 Personal history of self-harm: Secondary | ICD-10-CM | POA: Diagnosis not present

## 2018-10-31 DIAGNOSIS — F339 Major depressive disorder, recurrent, unspecified: Secondary | ICD-10-CM | POA: Diagnosis present

## 2018-10-31 DIAGNOSIS — F17210 Nicotine dependence, cigarettes, uncomplicated: Secondary | ICD-10-CM | POA: Diagnosis present

## 2018-10-31 DIAGNOSIS — F332 Major depressive disorder, recurrent severe without psychotic features: Secondary | ICD-10-CM | POA: Diagnosis not present

## 2018-10-31 MED ORDER — MAGNESIUM HYDROXIDE 400 MG/5ML PO SUSP: 30.0000 mL | Freq: Every day | ORAL | Status: DC | PRN

## 2018-10-31 MED ORDER — SENNOSIDES-DOCUSATE SODIUM 8.6-50 MG PO TABS: 1.0000 | ORAL_TABLET | Freq: Every day | ORAL | Status: DC | PRN

## 2018-10-31 MED ORDER — LORAZEPAM 1 MG PO TABS
1.0000 mg | ORAL_TABLET | Freq: Three times a day (TID) | ORAL | Status: DC
  Administered 2018-11-01 – 2018-11-02 (×5): 1 mg via ORAL
  Filled 2018-10-31 (×5): qty 1

## 2018-10-31 MED ORDER — MELATONIN 3 MG PO TABS: 6.0000 mg | ORAL_TABLET | Freq: Every evening | ORAL | Status: DC | PRN

## 2018-10-31 MED ORDER — BACLOFEN 10 MG PO TABS
ORAL_TABLET | ORAL | Status: AC
  Filled 2018-10-31: qty 1

## 2018-10-31 MED ORDER — BACLOFEN 10 MG PO TABS
10.0000 mg | ORAL_TABLET | Freq: Four times a day (QID) | ORAL | Status: DC
  Administered 2018-10-31 – 2018-11-02 (×7): 10 mg via ORAL
  Filled 2018-10-31 (×13): qty 1

## 2018-10-31 MED ORDER — GABAPENTIN 300 MG PO CAPS
300.0000 mg | ORAL_CAPSULE | Freq: Once | ORAL | Status: AC
  Administered 2018-10-31: 300 mg via ORAL
  Filled 2018-10-31 (×2): qty 1

## 2018-10-31 MED ORDER — BUPROPION HCL 75 MG PO TABS
75.0000 mg | ORAL_TABLET | Freq: Two times a day (BID) | ORAL | Status: DC
  Administered 2018-10-31: 75 mg via ORAL
  Filled 2018-10-31 (×5): qty 1

## 2018-10-31 MED ORDER — HYDROXYZINE HCL 25 MG PO TABS: 25.0000 mg | ORAL_TABLET | Freq: Three times a day (TID) | ORAL | Status: DC | PRN

## 2018-10-31 MED ORDER — ALUM & MAG HYDROXIDE-SIMETH 200-200-20 MG/5ML PO SUSP: 30.0000 mL | ORAL | Status: DC | PRN

## 2018-10-31 MED ORDER — GABAPENTIN 300 MG PO CAPS
300.0000 mg | ORAL_CAPSULE | Freq: Two times a day (BID) | ORAL | Status: DC
  Administered 2018-10-31: 300 mg via ORAL
  Filled 2018-10-31 (×3): qty 1

## 2018-10-31 MED ORDER — POLYETHYLENE GLYCOL 3350 17 G PO PACK: 17.0000 g | PACK | Freq: Every day | ORAL | Status: DC | PRN

## 2018-10-31 MED ORDER — PANTOPRAZOLE SODIUM 20 MG PO TBEC
20.0000 mg | DELAYED_RELEASE_TABLET | Freq: Every day | ORAL | Status: DC
  Administered 2018-11-01 – 2018-11-02 (×2): 20 mg via ORAL
  Filled 2018-10-31 (×5): qty 1

## 2018-10-31 MED ORDER — GABAPENTIN 300 MG PO CAPS
600.0000 mg | ORAL_CAPSULE | Freq: Three times a day (TID) | ORAL | Status: DC
  Administered 2018-11-01 – 2018-11-02 (×5): 600 mg via ORAL
  Filled 2018-10-31 (×12): qty 2

## 2018-10-31 MED ORDER — TRAZODONE HCL 50 MG PO TABS
50.0000 mg | ORAL_TABLET | Freq: Every evening | ORAL | Status: DC | PRN
  Administered 2018-10-31 – 2018-11-01 (×2): 50 mg via ORAL
  Filled 2018-10-31 (×2): qty 1

## 2018-10-31 MED ORDER — ACETAMINOPHEN 325 MG PO TABS
650.0000 mg | ORAL_TABLET | Freq: Four times a day (QID) | ORAL | Status: DC | PRN
  Administered 2018-11-01: 650 mg via ORAL
  Filled 2018-10-31: qty 2

## 2018-10-31 NOTE — Progress Notes (Signed)
Physical Therapy Treatment Patient Details Name: Edward [name] MRN: [id number] DOB: 11/25/1980 Today's Date: 10/31/2018    History of Present Illness Pt admitted through ED following OD suicide attempt.  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    Follow Up Recommendations  Other (comment)     Equipment Recommendations  None recommended by PT    Recommendations for Other Services       Precautions / Restrictions Precautions Precautions: Fall Restrictions Weight Bearing Restrictions: No    Mobility  Bed Mobility Overal bed mobility: Needs Assistance Bed Mobility: Supine to Sit     Supine to sit: Modified independent (Device/Increase time)     General bed mobility comments: Increased time but no physical assist  Transfers Overall transfer level: Needs assistance Equipment used: Rolling walker (2 wheeled) Transfers: Sit to/from Stand Sit to Stand: Min guard         General transfer comment: cues for transition position and use of UEs to self assist.   Ambulation/Gait Ambulation/Gait assistance: Min guard;Supervision Gait Distance (Feet): 147 Feet Assistive device: Rolling walker (2 wheeled) Gait Pattern/deviations: Step-to pattern;Decreased step length - right;Decreased step length - left;Shuffle;Decreased stance time - left Gait velocity: decr   General Gait Details: min cues for posture and position from RW.  Marked improvement in WB tolerance on L LE with use of pts shoes   Stairs             Wheelchair Mobility    Modified Rankin (Stroke Patients Only)       Balance Overall balance assessment: Needs assistance Sitting-balance support: No upper extremity supported;Feet supported       Standing balance support: No upper extremity supported Standing balance-Leahy Scale: Fair                              Cognition Arousal/Alertness: Awake/alert Behavior During Therapy: WFL for tasks assessed/performed Overall Cognitive Status: Within Functional Limits for tasks assessed                                        Exercises      General Comments        Pertinent Vitals/Pain Pain Assessment: 0-10 Pain Score: 2  Pain Location: L foot lateral aspect near ball of foot - with WB Pain Descriptors / Indicators: Sore Pain Intervention(s): Limited activity within patient's tolerance;Monitored during session    Home Living                      Prior Function            PT Goals (current goals can now be found in the care plan section) Acute Rehab PT Goals Patient Stated Goal: Walk PT Goal Formulation: With patient Time For Goal Achievement: 11/12/18 Potential to Achieve Goals: Good Progress towards PT goals: Progressing toward goals    Frequency    Min 3X/week      PT Plan Current plan remains appropriate    Co-evaluation              AM-PAC PT "6 Clicks" Mobility   Outcome Measure  Help needed turning from your back to your side  while in a flat bed without using bedrails?: None Help needed moving from lying on your back to sitting on the side of a flat bed without using bedrails?: None Help needed moving to and from a bed to a chair (including a wheelchair)?: A Little Help needed standing up from a chair using your arms (e.g., wheelchair or bedside chair)?: A Little Help needed to walk in hospital room?: A Little Help needed climbing 3-5 steps with a railing? : A Lot 6 Click Score: 19    End of Session Equipment Utilized During Treatment: Gait belt Activity Tolerance: Patient tolerated treatment well Patient left: in chair;with call bell/phone within reach;with nursing/sitter in room Nurse Communication: Mobility status PT Visit Diagnosis: Difficulty in walking, not elsewhere classified  (R26.2);Muscle weakness (generalized) (M62.81);Other abnormalities of gait and mobility (R26.89);Pain Pain - Right/Left: Left Pain - part of body: Ankle and joints of foot     Time: 0937-1010 PT Time Calculation (min) (ACUTE ONLY): 33 min  Charges:  $Gait Training: 23-37 mins                     Mauro Kaufmann PT Acute Rehabilitation Services Pager [phone] Office [phone]    Edward [name] 10/31/2018, 12:51 PM

## 2018-10-31 NOTE — Progress Notes (Signed)
Bedside shift report received, assumed care. Patient resting in bed watching television. Nurse tech at bedside for safety. Patient denies SI at this time. Will continue to monitor.

## 2018-10-31 NOTE — Progress Notes (Addendum)
Patient has been discharged to psychiatric inpatient unit.  Currently waiting for his bed.  He has remained stable overnight.  Continue current medical therapy.  Physical therapy as tolerated.

## 2018-10-31 NOTE — TOC Transition Note (Addendum)
Transition of Care [facility]) - CM/SW Discharge Note   Patient Details  Name: Edward [name] MRN: [id number] Date of Birth: 12-29-1980  [facility]) CM/SW Contact:  Althea Charon, LCSW Phone Number: 10/31/2018, 2:30 PM   Clinical Narrative:   Patient has a bed at [facility] and will be going in room 406 Bed 1. Patients attending physician at [facility] will be Dr.Clary. RN to call report to [phone]. [place] [phone]) pick up is scheduled for 4:45pm. Patient decline for CSW to contact family to make them aware of patients DC to [facility]    [facility] Barriers to Discharge: No Barriers Identified   Patient Goals and CMS Choice        Discharge Placement              Patient chooses bed at: (going to [facility]) Patient to be transferred to facility by: Juel Burrow)   Patient and family notified of of transfer: 10/31/18  Discharge Plan and Services                          Social Determinants of Health (SDOH) Interventions     Readmission Risk Interventions No flowsheet data found.

## 2018-10-31 NOTE — Tx Team (Signed)
Initial Treatment Plan 10/31/2018 7:46 PM [name]    PATIENT STRESSORS: Health problems Medication change or noncompliance   PATIENT STRENGTHS: Average or above average intelligence Communication skills Motivation for treatment/growth Supportive family/friends   PATIENT IDENTIFIED PROBLEMS:    "OD attempt"      "depression"             DISCHARGE CRITERIA:  Improved stabilization in mood, thinking, and/or behavior Need for constant or close observation no longer present Reduction of life-threatening or endangering symptoms to within safe limits Verbal commitment to aftercare and medication compliance  PRELIMINARY DISCHARGE PLAN: Attend aftercare/continuing care group Outpatient therapy Return to previous living arrangement  PATIENT/FAMILY INVOLVEMENT: This treatment plan has been presented to and reviewed with the patient, Edward [name] The patient and family has been given the opportunity to ask questions and make suggestions.  Shela Nevin, RN 10/31/2018, 7:46 PM

## 2018-10-31 NOTE — Discharge Summary (Signed)
Physician Discharge Summary  Edward HolmesJoseph G [name] ZOX:[id number] DOB: 05/04/1981 DOA: 10/23/2018  PCP: Medicine, [facility]  Admit date: 10/23/2018 Discharge date: 10/30/2018  Admitted From: Home  Disposition:  Inpatient psychiatric unit.   Recommendations for Outpatient Follow-up and new medication changes:  1. [facility] week after discharge.  2. Patient placed on lorazepam 1 mg tid for withdrawals, continue slow taper.  3. Muscle relaxant, baclofen has been decreased to 10 mg tid, to prevent side effects. 4. Gabapentin decreased to 600 mg tid.  5. Patient is medically stable to be transferred to inpatient psychiatry.    Home Health: no   Equipment/Devices: walker    Discharge Condition: stable  CODE STATUS:full  Diet recommendation: Regular.   Brief/Interim Summary: 38 year old male who presented after opiod overdose. He does have significant past medical history for depression andspinal cord injury.Patient was brought to the hospital due to Percocet overdose about 40 pills as a suicidal attempt. He was noted to have abnormal behavior by the neighbors. On his initial physical examination his blood pressure was 138/83, heart rate 84, temperature 98.6, respiratory rate 13, oxygen saturation 97%. His lungs wereclear to auscultation bilaterally, heart S1-S2 present and rhythmic, abdomen soft, no lower extremity edema, he was awake and alert. Sodium 139, potassium 3.1, chloride 107, bicarb 22, glucose 120, BUN 19, creatinine 0.92, AST 14, ALT 11, white count 10.5, hemoglobin 12.8, hematocrit 40.1, platelets 227, acetaminophen level 143, salicylate less than 7, urineanalysis had 6-10 white cells, more than 50 red cells, his drug screen was positive for opiates.  His head CT was negative for acute changes.  His chest x-ray was negative for infiltrates.  His EKG had 113 bpm, normal axis, normal intervals, no significant ST  segment or T wave changes, positive LVH. (V1-V2 rR)  Patient was admitted to the hospital working diagnosis of opiate overdose/suicidal attempt.  1.  Opiate overdose, suicidal attempt.  Patient was admitted to the medical ward, he was placed on suicide precautions, one-to-one sitter.  He was seen by psychiatry with recommendations to transfer to inpatient psychiatry once medically stable.    2.  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  Patient received benzodiazepines and Haldol for his withdrawal symptoms.  He was noted to develop worsening mental status, increased muscle rigidity, fever and elevated CKs, he was diagnosed with neuroleptic malignant syndrome.  Antipsychotics were discontinued, with slow improvement of his symptoms.  He has been seen by physical therapy, with no follow-up recommended.  Patient usually uses a walker for ambulation.  3.  Impending rhabdomyolysis with hypokalemia.  Likely due to neuroleptic malignant syndrome, his peak CK was 1,452, he received supportive medical care including intravenous fluids, his kidney function remained stable with a discharge creatinine 0.56.  His CK has been trending down, at discharge 243.  Potassium was corrected with potassium chloride, discharge potassium is 3.7  4.  Depression/chronic pain syndrome, cervical injury.  His muscle relaxant dose has been decreased to baclofen 10 mg 3 times daily, to avoid side effects.  Continue gabapentin 600 3 times daily.  Further psychiatric medications per inpatient psychiatric unit.  5.  Tobacco abuse.  Continue smoking cessation, patient received nicotine patch while hospitalized.  6.  Acetaminophen overdose.  Patient received N-acetylcysteine per protocol, no liver failure, follow-up acetaminophen level was less than 10.  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  [name]   Autonomic dysreflexia   Reactive depression   Suicidal ideation   NMS (neuroleptic malignant syndrome): Possible    Discharge Instructions       Allergies as of 10/30/2018      Reactions   Amoxicillin Itching, Rash, Other (See Comments)   Penicillins Itching, Rash, Other (See Comments)   Has patient had a PCN reaction causing immediate rash, facial/tongue/throat swelling, SOB or lightheadedness with hypotension: Yes Has patient had a PCN reaction causing severe rash involving mucus membranes or skin necrosis: No Has patient had a PCN reaction that required hospitalization: No Has patient had a PCN reaction occurring within the last 10 years: No If all of the above answers are "NO", then may proceed with Cephalosporin use. Has patient had a PCN reaction causing immediate rash, facial/tongue/throat swelling, SOB or lightheadedness with hypotension: Yes Has patient had a PCN reaction causing severe rash involving mucus membranes or skin necrosis: No Has patient had a PCN reaction that required hospitalization: No Has patient had a PCN reaction occurring within the last 10 years: No If all of the above answers are "NO", then may proceed with Cephalosporin use.   Clavulanic Acid Other (See Comments)   Urecholine [bethanechol] Itching   Itching, swelling, sweating (hot flashes)   Ambien [zolpidem Tartrate] Other (See Comments)   hallucinations   Amoxicillin Rash   Penicillins Rash         Medication List    STOP taking these medications   acetaminophen 500 MG tablet Commonly known as:  TYLENOL   buPROPion 75 MG tablet Commonly known as:  Wellbutrin   diclofenac 75 MG EC tablet Commonly known as:  VOLTAREN   oxyCODONE-acetaminophen 7.5-325 MG tablet Commonly known as:  Percocet   tiZANidine 4 MG tablet Commonly known as:  ZANAFLEX     TAKE these medications   baclofen 10 MG tablet Commonly known as:  LIORESAL Take 1 tablet (10 mg total)  by mouth 4 (four) times daily. What changed:    medication strength  how much to take  how to take this  when to take this  additional instructions   gabapentin 300 MG capsule Commonly known as:  NEURONTIN Take 2 capsules (600 mg total) by mouth 3 (three) times daily for 30 days. What changed:  when to take this   LORazepam 1 MG tablet Commonly known as:  ATIVAN Take 1 tablet (1 mg total) by mouth 3 (three) times daily.   Melatonin 3 MG Tabs Take 2 tablets (6 mg total) by mouth at bedtime. What changed:    when to take this  reasons to take this   pantoprazole 20 MG tablet Commonly known as:  PROTONIX TAKE 1 TABLET(20 MG) BY MOUTH AT BEDTIME What changed:    how much to take  how to take this  when to take this   polyethylene glycol 17 g packet Commonly known as:  MIRALAX / GLYCOLAX Take 17 g by mouth daily. What changed:    when to take this  reasons to take this   senna-docusate 8.6-50 MG tablet Commonly known as:  Senokot-S Take 2 tablets by mouth every other day. What changed:    how much to take  when to take this  reasons to take this           Allergies  Allergen Reactions  . Amoxicillin Itching, Rash and Other (See Comments)  . Penicillins Itching, Rash and Other (See Comments)  Has patient had a PCN reaction causing immediate rash, facial/tongue/throat swelling, SOB or lightheadedness with hypotension: Yes Has patient had a PCN reaction causing severe rash involving mucus membranes or skin necrosis: No Has patient had a PCN reaction that required hospitalization: No Has patient had a PCN reaction occurring within the last 10 years: No If all of the above answers are "NO", then may proceed with Cephalosporin use. Has patient had a PCN reaction causing immediate rash, facial/tongue/throat swelling, SOB or lightheadedness with hypotension: Yes Has patient had a PCN reaction causing severe rash involving mucus membranes or  skin necrosis: No Has patient had a PCN reaction that required hospitalization: No Has patient had a PCN reaction occurring within the last 10 years: No If all of the above answers are "NO", then may proceed with Cephalosporin use.   . Clavulanic Acid Other (See Comments)  . Urecholine [Bethanechol] Itching    Itching, swelling, sweating (hot flashes)  . Ambien [Zolpidem Tartrate] Other (See Comments)    hallucinations  . Amoxicillin Rash  . Penicillins Rash    Consultations:  Psychiatry   Neurology    Procedures/Studies: ImagingResults   Ct Head Wo Contrast  Result Date: 10/27/2018 CLINICAL DATA:  Overdose. EXAM: CT HEAD WITHOUT CONTRAST TECHNIQUE: Contiguous axial images were obtained from the base of the skull through the vertex without intravenous contrast. COMPARISON:  None. FINDINGS: Brain: There is no evidence of acute infarct, intracranial hemorrhage, mass, midline shift, or extra-axial fluid collection. The ventricles and sulci are normal. Vascular: No hyperdense vessel. Skull: No fracture or focal osseous lesion. Sinuses/Orbits: Minimal posterior left ethmoid air cell mucosal thickening. Clear mastoid air cells. Unremarkable orbits. Other: None. IMPRESSION: Negative head CT. Electronically Signed   By: Sebastian Ache M.D.   On: 10/27/2018 17:05   Dg Chest Port 1 View  Result Date: 10/25/2018 CLINICAL DATA:  Overdose on opioids. Patient combative and would not remove necklace. EXAM: PORTABLE CHEST 1 VIEW COMPARISON:  10/22/2018 FINDINGS: Lungs are adequately inflated without consolidation or effusion. Cardiomediastinal silhouette and remainder of the exam is unchanged. IMPRESSION: No active disease. Electronically Signed   By: Elberta Fortis M.D.   On: 10/25/2018 14:56   Dg Abd Acute 2+v W 1v Chest  Result Date: 10/22/2018 CLINICAL DATA:  Abdominal pain, constipation EXAM: DG ABDOMEN ACUTE W/ 1V CHEST COMPARISON:  None. FINDINGS: Lungs are clear.  No pleural  effusion or pneumothorax. The heart is normal in size. Nonobstructive bowel gas pattern. No evidence of free air under the diaphragm on the upright view. Mild left colonic stool burden. Visualized osseous structures are within normal limits. IMPRESSION: No evidence of acute cardiopulmonary disease. No evidence of small bowel obstruction or free air. Mild left colonic stool burden, within normal limits. Electronically Signed   By: Charline Bills M.D.   On: 10/22/2018 03:05        Subjective: Patient is feeling better, improving muscle cramps, no nausea or vomiting, no chest pain or dyspnea. Ambulated with walker yesterday with the supervision of physical therapy.   Discharge Exam: Vitals:   10/29/18 2127 10/30/18 0549  BP: 118/65 (!) 119/57  Pulse: (!) 57 (!) 58  Resp: 18 18  Temp: 99.3 F (37.4 C) 99.1 F (37.3 C)  SpO2: 98% 100%         Vitals:   10/29/18 0532 10/29/18 1330 10/29/18 2127 10/30/18 0549  BP: 114/67 113/68 118/65 (!) 119/57  Pulse: 64 66 (!) 57 (!) 58  Resp: 18  Temp: 98.4 F (36.9 C) 98.6 F (37 C) 99.3 F (37.4 C) 99.1 F (37.3 C)  TempSrc: Oral Oral Oral Oral  SpO2: 99% 99% 98% 100%  Weight:      Height:        General: Not in pain or dyspnea.  Neurology: Awake and alert, non focal  E ENT: mild pallor, no icterus, oral mucosa moist Cardiovascular: No JVD. S1-S2 present, rhythmic, no gallops, rubs, or murmurs. No lower extremity edema. Pulmonary: positive breath sounds bilaterally, adequate air movement, no wheezing, rhonchi or rales. Gastrointestinal. Abdomen with no organomegaly, non tender, no rebound or guarding Skin. No rashes Musculoskeletal: no joint deformities   The results of significant diagnostics from this hospitalization (including imaging, microbiology, ancillary and laboratory) are listed below for reference.     Microbiology:        Recent Results (from the past 240 hour(s))  Culture, Urine      Status: None   Collection Time: 10/25/18  9:03 AM  Result Value Ref Range Status   Specimen Description   Final    URINE, RANDOM Performed at [facility], [address] [place], [place] [address]    Special Requests   Final    NONE Performed at [facility], [address], [place], [place] [address]    Culture   Final    NO GROWTH Performed at [facility], [address] [place], [place] [address]    Report Status 10/26/2018 FINAL  Final  Culture, blood (Routine X 2) w Reflex to ID Panel     Status: None (Preliminary result)   Collection Time: 10/25/18  2:36 PM  Result Value Ref Range Status   Specimen Description   Final    BLOOD LEFT ANTECUBITAL Performed at [facility], [address], [place], [place] [address]    Special Requests   Final    BOTTLES DRAWN AEROBIC AND ANAEROBIC Blood Culture adequate volume Performed at [facility], [address], [place], [place] [address]    Culture   Final    NO GROWTH 4 DAYS Performed at [facility], [address], [place], [place] [address]    Report Status PENDING  Incomplete  Culture, blood (Routine X 2) w Reflex to ID Panel     Status: None (Preliminary result)   Collection Time: 10/25/18  2:37 PM  Result Value Ref Range Status   Specimen Description   Final    BLOOD LEFT HAND Performed at [facility], [address], [place], [place] [address]    Special Requests   Final    BOTTLES DRAWN AEROBIC ONLY Blood Culture adequate volume Performed at [facility], [address], [place], [place] [address]    Culture   Final    NO GROWTH 4 DAYS Performed at [facility], [address] [place], [place] [address]    Report Status PENDING  Incomplete     Labs: BNP (last 3 results) RecentLabs(withinlast365days)  No  results for input(s): BNP in the last 8760 hours.   Basic Metabolic Panel: LastLabs          Recent Labs  Lab 10/25/18 0322 10/26/18 0431 10/26/18 1024 10/27/18 0531 10/28/18 0531 10/29/18 0513  NA 143 144  --  145 140 139  K 3.6 3.5  --  3.8 2.8* 2.9*  CL 112* 112*  --  115* 107 108  CO2 18*  17*  --  20* 25 24  GLUCOSE 84 90  --  129* 126* 114*  BUN 23* 25*  --  17 10 10   CREATININE 0.75 0.78  --  0.61 0.62 0.57*  CALCIUM 9.7 9.3  --  9.2 8.7* 8.4*  MG  --   --  2.3 2.1  --   --      Liver Function Tests: LastLabs         Recent Labs  Lab 10/24/18 0708 10/25/18 0322 10/26/18 0431 10/27/18 0531 10/28/18 0531  AST 22 37 34 44* 33  ALT 16 19 23  33 39  ALKPHOS 48 54 48 48 43  BILITOT 1.3* 1.5* 1.7* 1.6* 1.1  PROT 7.4 7.9 7.4 7.3 6.4*  ALBUMIN 4.3 4.8 4.4 4.1 3.7     LastLabs  No results for input(s): LIPASE, AMYLASE in the last 168 hours.   LastLabs     Recent Labs  Lab 10/27/18 1853  AMMONIA 27     CBC: LastLabs         Recent Labs  Lab 10/24/18 0306 10/25/18 0322 10/26/18 0431 10/27/18 0531 10/28/18 0531  WBC 12.8* 11.2* 9.5 8.7 6.8  NEUTROABS  --   --  7.7 6.7 4.5  HGB 13.6 13.7 12.7* 11.4* 11.1*  HCT 42.1 41.1 39.8 35.6* 35.3*  MCV 100.5* 97.9 100.0 100.3* 100.9*  PLT 212 217 215 199 151     Cardiac Enzymes: LastLabs         Recent Labs  Lab 10/26/18 1024 10/27/18 0531 10/27/18 1853 10/28/18 0531 10/29/18 0513  CKTOTAL 1,015* 1,452* 952* 579* 243     BNP: LastLabs  Invalid input(s): POCBNP   CBG: LastLabs         Recent Labs  Lab 10/28/18 0813 10/28/18 1139 10/28/18 1605 10/28/18 2126 10/29/18 0728  GLUCAP 121* 113* 121* 127* 101*     D-Dimer RecentLabs(last2labs)  No results for input(s): DDIMER in the last 72 hours.   Hgb A1c RecentLabs(last2labs)  No results for input(s): HGBA1C in the last 72 hours.   Lipid Profile RecentLabs(last2labs)  No results for input(s):  CHOL, HDL, LDLCALC, TRIG, CHOLHDL, LDLDIRECT in the last 72 hours.   Thyroid function studies  RecentLabs(last2labs)  No results for input(s): TSH, T4TOTAL, T3FREE, THYROIDAB in the last 72 hours.  Invalid input(s): FREET3   Anemia work up RecentLabs(last2labs)  No results for input(s): VITAMINB12, FOLATE, FERRITIN, TIBC, IRON, RETICCTPCT in the last 72 hours.   Urinalysis Labs(Brief)          Component Value Date/Time   COLORURINE YELLOW 10/25/2018 0903   APPEARANCEUR HAZY (A) 10/25/2018 0903   LABSPEC 1.026 10/25/2018 0903   PHURINE 5.0 10/25/2018 0903   GLUCOSEU NEGATIVE 10/25/2018 0903   HGBUR MODERATE (A) 10/25/2018 0903   BILIRUBINUR NEGATIVE 10/25/2018 0903   KETONESUR 80 (A) 10/25/2018 0903   PROTEINUR 30 (A) 10/25/2018 0903   NITRITE NEGATIVE 10/25/2018 0903   LEUKOCYTESUR NEGATIVE 10/25/2018 0903     Sepsis Labs LastLabs  Invalid input(s): PROCALCITONIN,  WBC,  LACTICIDVEN   Microbiology        Recent Results (from the past 240 hour(s))  Culture, Urine     Status: None   Collection Time: 10/25/18  9:03 AM  Result Value Ref Range Status   Specimen Description   Final    URINE, RANDOM Performed at [facility], [address], [place], [place] [address]    Special Requests   Final  NONE Performed at [facility], [address], [place], [place] [address]    Culture   Final    NO GROWTH Performed at [facility], [address], [place], [place] [address]    Report Status 10/26/2018 FINAL  Final  Culture, blood (Routine X 2) w Reflex to ID Panel     Status: None (Preliminary result)   Collection Time: 10/25/18  2:36 PM  Result Value Ref Range Status   Specimen Description   Final    BLOOD LEFT ANTECUBITAL Performed at [facility], [address], [place], [place] [address]    Special Requests   Final    BOTTLES DRAWN AEROBIC  AND ANAEROBIC Blood Culture adequate volume Performed at [facility], [address], [place], [place] [address]    Culture   Final    NO GROWTH 4 DAYS Performed at [facility], [address], [place], [place] [address]    Report Status PENDING  Incomplete  Culture, blood (Routine X 2) w Reflex to ID Panel     Status: None (Preliminary result)   Collection Time: 10/25/18  2:37 PM  Result Value Ref Range Status   Specimen Description   Final    BLOOD LEFT HAND Performed at [facility], [address], [place], [place] [address]    Special Requests   Final    BOTTLES DRAWN AEROBIC ONLY Blood Culture adequate volume Performed at [facility], [address], [place], [place] [address]    Culture   Final    NO GROWTH 4 DAYS Performed at [facility], [address], [place], [place] [address]    Report Status PENDING  Incomplete     Time coordinating discharge: 45 minutes  SIGNED:   Coralie Keens, MD      [facility] 10/30/2018, 8:47 AM

## 2018-10-31 NOTE — Progress Notes (Signed)
Adult Psychoeducational Group Note  Date:  10/31/2018 Time:  9:27 PM  Group Topic/Focus:  Wrap-Up Group:   The focus of this group is to help patients review their daily goal of treatment and discuss progress on daily workbooks.  Participation Level:  Active  Participation Quality:  Appropriate and Attentive  Affect:  Depressed  Cognitive:  Alert, Appropriate and Oriented  Insight: None  Engagement in Group:  Engaged  Modes of Intervention:  Discussion and Education  Additional Comments:  Pt attended and participated in group. Pt stated his goal is to be able to walk more.   Berlin Hun 10/31/2018, 9:27 PM

## 2018-10-31 NOTE — Progress Notes (Signed)
Patient is a 38 year old male who presented today voluntarily from [facility] hospital - pt has a hx of significant depression and spinal cord injury (2017) who presented originally to [facility] via EMS after an intentional OD of Percocet. Pt reports that the OD was triggered by his antidepressant- was having bad dreams and thoughts to hurt himself. Pt presents with a depressed affect/ calm cooperative behavior- answered questions appropriately throughout interview. Pt sitting in W/C upon approach- unable to stand without assistance due to weakness in both legs. . Skin check revealed no  abnormalities other than surgical scar located post cervical region. VS obtained . Admission paperwork completed and signed- verbal understanding expressed. Belongings searched and secured in locker Pt provided with dinner and oriented to unit. High fall risk precautions initiated.

## 2018-10-31 NOTE — Progress Notes (Signed)
Physical Therapy Treatment Patient Details Name: Edward HolmesJoseph G [name] MRN: [id number] DOB: 01/03/1981 Today's Date: 10/31/2018    History of Present Illness Pt admitted through ED following OD suicide attempt.  Pt with hx of C1-C4 spinal cord injury with significant residual deficits    PT Comments    Pt continues to progress with all aspects of mobility and currently at supervision min guard with RW for ambulation.  Pt continues to experience increased rigidity vs pre-admission state but able to don shoe but required assist to tie laces.  Follow Up Recommendations  Other (comment)     Equipment Recommendations  None recommended by PT    Recommendations for Other Services       Precautions / Restrictions Precautions Precautions: Fall Restrictions Weight Bearing Restrictions: No    Mobility  Bed Mobility Overal bed mobility: Modified Independent                Transfers Overall transfer level: Needs assistance Equipment used: Rolling walker (2 wheeled) Transfers: Sit to/from Stand Sit to Stand: Min guard;Supervision         General transfer comment: cues for transition position and use of UEs to self assist.   Initial attempt requiring steady assist.  Pt performed 2 additional times with no physical contact  Ambulation/Gait Ambulation/Gait assistance: Min guard;Supervision Gait Distance (Feet): 300 Feet Assistive device: Rolling walker (2 wheeled) Gait Pattern/deviations: Decreased step length - right;Decreased step length - left;Shuffle;Decreased stance time - left;Step-to pattern;Step-through pattern     General Gait Details: min cues for posture and position from RW.  Marked improvement in WB tolerance on L LE with use of pts shoes   Stairs             Wheelchair Mobility    Modified Rankin (Stroke Patients Only)       Balance Overall balance assessment: Needs assistance Sitting-balance support: No upper extremity supported;Feet  supported Sitting balance-Leahy Scale: Good     Standing balance support: No upper extremity supported Standing balance-Leahy Scale: Fair                              Cognition Arousal/Alertness: Awake/alert Behavior During Therapy: WFL for tasks assessed/performed Overall Cognitive Status: Within Functional Limits for tasks assessed                                        Exercises      General Comments        Pertinent Vitals/Pain Pain Assessment: 0-10 Pain Score: 2  Pain Location: L foot lateral aspect near ball of foot - with WB Pain Descriptors / Indicators: Sore Pain Intervention(s): Limited activity within patient's tolerance;Monitored during session    Home Living                      Prior Function            PT Goals (current goals can now be found in the care plan section) Acute Rehab PT Goals Patient Stated Goal: Walk PT Goal Formulation: With patient Time For Goal Achievement: 11/12/18 Potential to Achieve Goals: Good Progress towards PT goals: Progressing toward goals    Frequency    Min 3X/week      PT Plan Current plan remains appropriate    Co-evaluation  AM-PAC PT "6 Clicks" Mobility   Outcome Measure  Help needed turning from your back to your side while in a flat bed without using bedrails?: None Help needed moving from lying on your back to sitting on the side of a flat bed without using bedrails?: None Help needed moving to and from a bed to a chair (including a wheelchair)?: A Little Help needed standing up from a chair using your arms (e.g., wheelchair or bedside chair)?: A Little Help needed to walk in hospital room?: A Little Help needed climbing 3-5 steps with a railing? : A Lot 6 Click Score: 19    End of Session Equipment Utilized During Treatment: Gait belt Activity Tolerance: Patient tolerated treatment well Patient left: in bed;with call bell/phone within  reach;with nursing/sitter in room Nurse Communication: Mobility status PT Visit Diagnosis: Difficulty in walking, not elsewhere classified (R26.2);Muscle weakness (generalized) (M62.81);Other abnormalities of gait and mobility (R26.89);Pain Pain - Right/Left: Left Pain - part of body: Ankle and joints of foot     Time: 1330-1355 PT Time Calculation (min) (ACUTE ONLY): 25 min  Charges:  $Gait Training: 23-37 mins                     Edward [name] PT Acute Rehabilitation Services Pager [phone] Office [phone]    Edward [name] 10/31/2018, 4:03 PM

## 2018-11-01 DIAGNOSIS — R45851 Suicidal ideations: Secondary | ICD-10-CM

## 2018-11-01 DIAGNOSIS — F17210 Nicotine dependence, cigarettes, uncomplicated: Secondary | ICD-10-CM

## 2018-11-01 DIAGNOSIS — R450 Nervousness: Secondary | ICD-10-CM

## 2018-11-01 DIAGNOSIS — F332 Major depressive disorder, recurrent severe without psychotic features: Secondary | ICD-10-CM

## 2018-11-01 LAB — TSH: TSH: 1.088 u[IU]/mL (ref 0.350–4.500)

## 2018-11-01 NOTE — Progress Notes (Signed)
Patient ID: PEJA HARTTER, male   DOB: 1980/09/05, 38 y.o.   MRN: [id number]  1:1 RN Note  D: Pt was in his bed during most of the morning asleep with even an unlabored respirations. Sitter is with pt. A: Pt continues 1:1 observation for safety. R: Pt remains safe on the unit.

## 2018-11-01 NOTE — Progress Notes (Signed)
Patient ID: Edward [name], male   DOB: [birth year], 38 y.o.   MRN: [id number]  1:1 RN Note  D: Pt sat in the day room and watched television. Pt talked on the telephone in the hallway. Sitter with pt. No signs of distress or discomfort noted. A: 1:1 remains for safety of pt. R: Pt remains safe on the unit.
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
## 2018-11-01 NOTE — [facility] Group Notes (Signed)
[facility] LCSW Group Therapy Note  Date/Time:  11/01/2018 9:00-10:00 or 10:00-11:00AM  Type of Therapy and Topic:  Group Therapy:  Healthy and Unhealthy Supports  Participation Level:  Active   Description of Group:  Patients in this group were introduced to the idea of adding a variety of healthy supports to address the various needs in their lives.Patients discussed what additional healthy supports could be helpful in their recovery and wellness after discharge in order to prevent future hospitalizations.   An emphasis was placed on using counselor, doctor, therapy groups, 12-step groups, and problem-specific support groups to expand supports.  Several songs were played to emphasize points made throughout group.  Therapeutic Goals:   1)  discuss importance of adding supports to stay well once out of the hospital  2)  compare healthy versus unhealthy supports and identify some examples of each  3)  generate ideas and descriptions of healthy supports that can be added  4)  offer mutual support about how to address unhealthy supports  5)  encourage active participation in and adherence to discharge plan    Summary of Patient Progress:  The patient stated that current healthy supports in his life are his parents, his siblings, his oldest son, and his doctor while current unhealthy supports include some of his friends.  The patient expressed a willingness to add therapy as support(s) to help in his recovery journey.  He was very accepting throughout group of the idea that we need additional supports in many areas of life to help navigate them.  He listened attentively and was interested throughout group.   Therapeutic Modalities:   Motivational Interviewing Brief Solution-Focused Therapy  Ambrose Mantle, LCSW

## 2018-11-01 NOTE — Plan of Care (Signed)
  Problem: Coping: Goal: Ability to verbalize frustrations and anger appropriately will improve Outcome: Progressing   D: Pt alert and oriented on the unit. Pt engaging with Designer, television/film set. Pt denies SI/HI, A/VH. Pt's affect was flat and mood depressed. Pt attended groups and is pleasant and cooperative. Pt rated his depression, anxiety, and feelings of hopelessness all a 0, with 10 being the worst. Pt's goal is "walking." A: Education, support and encouragement provided, q15 minute safety checks remain in effect. Medications administered per MD orders. R: No reactions/side effects to medicine noted. Pt denies any concerns at this time, and verbally contracts for safety. Pt ambulating on the unit with no issues. Pt remains safe on and off the unit.

## 2018-11-01 NOTE — Progress Notes (Signed)
Patient ID: Edward [name], male   DOB: 07-02-1981, 38 y.o.   MRN: [id number]  1:1 RN Note  D: Pt isolated to his room but did attend group today. Pt talked on the phone and sat in the day room. Sitter is with pt. A: 1:1 remains for safety of pt. R: Pt remains safe on the unit.

## 2018-11-01 NOTE — Progress Notes (Signed)
1:1 Nursing note- Patient is currently lying in bed awake and resting. He has been up in the dayroom tonight watching tv with minimal interaction with peers. He was compliant with his medications. Writer spoke with him 1:1 and he reports having had a good day. He reports that he may be discharging on tomorrow, Clinical research associate inquired about how he would ambulate at home. He reports that his 39 year old son is home and helps him out. He reports that he has a motorized wheelchair at home and a full gym that he works out. He is pleasant and has smiled a little more tonight. 1:1 continues and sitter is at patients bedside while awake. Will continue to monitor.

## 2018-11-01 NOTE — Progress Notes (Signed)
Adult Psychoeducational Group Note  Date:  11/01/2018 Time:  9:05 PM  Group Topic/Focus:  Wrap-Up Group:   The focus of this group is to help patients review their daily goal of treatment and discuss progress on daily workbooks.  Participation Level:  Active  Participation Quality:  Appropriate  Affect:  Appropriate  Cognitive:  Appropriate  Insight: Appropriate  Engagement in Group:  Engaged  Modes of Intervention:  Discussion  Additional Comments:  Patient attended group and said that his day was a 9. His coping skills that was used today were watching tv, attending groups and socializing.   Latha Staunton W Shellia Hartl 11/01/2018, 9:05 PM

## 2018-11-01 NOTE — Progress Notes (Signed)
Patient ID: Edward [name], male   DOB: 1981/01/04, 38 y.o.   MRN: [id number]  Pt did not attend goals/ orientation group this morning with the [facility].
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
## 2018-11-01 NOTE — [facility] Group Notes (Signed)
[facility] Group Notes:  (Nursing/MHT/Case Management/Adjunct)  Date:  11/01/2018  Time: 1400 Type of Therapy:  Nurse Education  Participation Level:  Active  Participation Quality:  Appropriate and Attentive  Affect:  Appropriate  Cognitive:  Appropriate  Insight:  Improving  Engagement in Group:  Engaged  Modes of Intervention:  Discussion and Education  Summary of Progress/Problems: Group played a non competitive game that fosters listening skills as well as self expression Shela Nevin 11/01/2018, 6:30 PM

## 2018-11-02 ENCOUNTER — Ambulatory Visit: Payer: Self-pay | Admitting: Registered Nurse

## 2018-11-02 MED ORDER — TRAZODONE HCL 50 MG PO TABS: 50.0000 mg | ORAL_TABLET | Freq: Every evening | ORAL | 0 refills | Status: AC | PRN

## 2018-11-02 NOTE — Discharge Summary (Signed)
Physician Discharge Summary Note  Patient:  Edward [name] is an 38 y.o., male MRN:  [id number] DOB:  08/11/1980 Patient phone:  [phone] (home)  Patient address:   [address]  [place] [id number],  Total Time spent with patient: 15 minutes  Date of Admission:  10/31/2018 Date of Discharge: 11/02/18  Reason for Admission:  Overdose on 40 of 7.5/325 mg Percocet tabs  Principal Problem: MDD (major depressive disorder), recurrent episode [facility](HCC) Discharge Diagnoses: Principal Problem:   MDD (major depressive disorder), recurrent episode (HCC)   Past Psychiatric History: See admission H&P  Past Medical History: History reviewed. No pertinent past medical history.  Past Surgical History:  Procedure Laterality Date  . ANTERIOR CERVICAL DECOMP/DISCECTOMY FUSION N/A 05/05/2016   Procedure: POSTERIOR CERVICAL LAMINECTOMY THREE - FIVE WITH FIXATION AND FUSION;  Surgeon: Loura HaltBenjamin Jared Ditty, MD;  Location: [facility] OR;  Service: Neurosurgery;  Laterality: N/A;   Family History: History reviewed. No pertinent family history. Family Psychiatric  History: Denies Social History:  Social History   Substance and Sexual Activity  Alcohol Use No     Social History   Substance and Sexual Activity  Drug Use No    Social History   Socioeconomic History  . Marital status: Single    Spouse name: Not on file  . Number of children: Not on file  . Years of education: Not on file  . Highest education level: Not on file  Occupational History  . Not on file  Social Needs  . Financial resource strain: Not on file  . Food insecurity:    Worry: Not on file    Inability: Not on file  . Transportation needs:    Medical: Not on file    Non-medical: Not on file  Tobacco Use  . Smoking status: Current Every Day Smoker    Packs/day: 1.00  . Smokeless tobacco: Never Used  Substance and Sexual Activity  . Alcohol use: No  . Drug use: No  . Sexual activity: Not on file  Lifestyle  .  Physical activity:    Days per week: Not on file    Minutes per session: Not on file  . Stress: Not on file  Relationships  . Social connections:    Talks on phone: Not on file    Gets together: Not on file    Attends religious service: Not on file    Active member of club or organization: Not on file    Attends meetings of clubs or organizations: Not on file    Relationship status: Not on file  Other Topics Concern  . Not on file  Social History Narrative   ** Merged History Encounter [facility]**        Hospital Course:  From admission SRA: Patient is a [age] year old male with past medical history significant for spinal cord injury/chronic cervical spine pain who originally presented to the [facility] emergency department on 10/23/2018 after an intentional overdose of Percocet in a suicide attempt.  Emergency medical services was called by his mother who lives next door to the duplex apartment where he lives.  Apparently he was reported to be having abnormal behavior the night prior to the overdose.  The patient admitted in the emergency department to an intentional overdose of Percocet.  Reportedly he took 40 tablets.  He was in his normal state of health prior to this.  He had seen a psychiatrist reportedly a month prior to this, and his antidepressant medications  had been changed from Cymbalta to Wellbutrin.  The patient states today that he feels as though the suicide attempt was secondary to the Wellbutrin.  He was hospitalized at [facility].  Unfortunately had some issue in the emergency department and received haloperidol as well as Ativan.  Unfortunately he had an untoward reaction to this and developed neuroleptic malignant syndrome.  His CKs reached 1452 and his creatinine remained stable.  His potassium was low but this was repleted during the course the hospitalization.  He remained hospitalized on the medical floor until yesterday when he was transferred to our  facility.  He currently denies any helplessness, hopelessness or worthlessness.  He denies any suicidal ideation.  He denied any crying spells.  He stated he had originally been treated for depression secondary to his fiance breaking up with him, and his mother receiving a cancer diagnosis.  The mother has had treatment, and apparently is doing better with this.  Because of the intentional overdose he was transferred to our facility for evaluation and stabilization.  Edward [name] was admitted after an overdose on 24 Percocet tablets. He required admission to medical unit (see above). He denied SI on admission to [facility] and requested discharge. Wellbutrin was discontinued, as patient felt this medication had contributed to SI. Trazodone was started for insomnia. He participated in group therapy on the unit. Collateral information was obtained from patient's mother, who denied any safety concerns for discharge. Edward [name] remained on the [facility] unit for 2 days. He stabilized with medication and therapy. He was discharged on the medications listed below. He denies any SI/HI/AVH and contracts for safety. He agrees to follow up at [facility] and [facility] (see below). He is provided with prescriptions for medications upon discharge. His son is picking him up for discharge home.  Physical Findings: AIMS:  , ,  ,  ,    CIWA:    COWS:     Musculoskeletal: Strength & Muscle Tone: within normal limits Gait & Station: normal Patient leans: N/A  Psychiatric Specialty Exam: Physical Exam  Nursing note and vitals reviewed. Constitutional: He is oriented to person, place, and time. He appears well-developed and well-nourished.  Cardiovascular: Normal rate.  Respiratory: Effort normal.  Neurological: He is alert and oriented to person, place, and time.  Psychiatric: His behavior is normal.    Review of Systems  Constitutional: Negative.   Psychiatric/Behavioral: Negative for depression,  hallucinations, substance abuse and suicidal ideas. The patient is not nervous/anxious and does not have insomnia.     Blood pressure 99/61, pulse 75, temperature 98.3 F (36.8 C), temperature source Oral, resp. rate 16, height 6' (1.829 m), weight 67.6 kg, SpO2 99 %.Body mass index is 20.21 kg/m.  See MD's discharge SRA     Have you used any form of tobacco in the last 30 days? (Cigarettes, Smokeless Tobacco, Cigars, and/or Pipes): No  Has this patient used any form of tobacco in the last 30 days? (Cigarettes, Smokeless Tobacco, Cigars, and/or Pipes)  No  Blood Alcohol level:  Lab Results  Component Value Date   ETH <10 10/23/2018   ETH 148 (H) 05/04/2016    Metabolic Disorder Labs:  No results found for: HGBA1C, MPG No results found for: PROLACTIN No results found for: CHOL, TRIG, HDL, CHOLHDL, VLDL, LDLCALC  See Psychiatric Specialty Exam and Suicide Risk Assessment completed by Attending Physician prior to discharge.  Discharge destination:  Home  Is patient on multiple antipsychotic therapies at discharge:  No   Has Patient had three or more failed trials of antipsychotic monotherapy by history:  No  Recommended Plan for Multiple Antipsychotic Therapies: NA  Discharge Instructions    Discharge instructions   Complete by:  As directed    Patient is instructed to take all prescribed medications as recommended. Report any side effects or adverse reactions to your outpatient psychiatrist. Patient is instructed to abstain from alcohol and illegal drugs while on prescription medications. In the event of worsening symptoms, patient is instructed to call the crisis hotline, 911, or go to the nearest emergency department for evaluation and treatment.     Allergies as of 11/02/2018      Reactions   Amoxicillin Itching, Rash, Other (See Comments)   Penicillins Itching, Rash, Other (See Comments)   Has patient had a PCN reaction causing immediate rash, facial/tongue/throat  swelling, SOB or lightheadedness with hypotension: Yes Has patient had a PCN reaction causing severe rash involving mucus membranes or skin necrosis: No Has patient had a PCN reaction that required hospitalization: No Has patient had a PCN reaction occurring within the last 10 years: No If all of the above answers are "NO", then may proceed with Cephalosporin use. Has patient had a PCN reaction causing immediate rash, facial/tongue/throat swelling, SOB or lightheadedness with hypotension: Yes Has patient had a PCN reaction causing severe rash involving mucus membranes or skin necrosis: No Has patient had a PCN reaction that required hospitalization: No Has patient had a PCN reaction occurring within the last 10 years: No If all of the above answers are "NO", then may proceed with Cephalosporin use.   Clavulanic Acid Other (See Comments)   Urecholine [bethanechol] Itching   Itching, swelling, sweating (hot flashes)   Ambien [zolpidem Tartrate] Other (See Comments)   hallucinations   Amoxicillin Rash   Penicillins Rash      Medication List    TAKE these medications     Indication  baclofen 10 MG tablet Commonly known as:  LIORESAL Take 1 tablet (10 mg total) by mouth 4 (four) times daily.  Indication:  Muscle Spasticity   gabapentin 300 MG capsule Commonly known as:  NEURONTIN Take 2 capsules (600 mg total) by mouth 3 (three) times daily for 30 days.  Indication:  Neuropathic Pain   LORazepam 1 MG tablet Commonly known as:  ATIVAN Take 1 tablet (1 mg total) by mouth 3 (three) times daily.  Indication:  Alcohol Withdrawal Syndrome   Melatonin 3 MG Tabs Take 2 tablets (6 mg total) by mouth at bedtime. What changed:    when to take this  reasons to take this  Indication:  Trouble Sleeping   pantoprazole 20 MG tablet Commonly known as:  PROTONIX TAKE 1 TABLET(20 MG) BY MOUTH AT BEDTIME What changed:    how much to take  how to take this  when to take this   Indication:  Gastroesophageal Reflux Disease   polyethylene glycol 17 g packet Commonly known as:  MIRALAX / GLYCOLAX Take 17 g by mouth daily. What changed:    when to take this  reasons to take this  Indication:  Constipation   senna-docusate 8.6-50 MG tablet Commonly known as:  Senokot-S Take 2 tablets by mouth every other day. What changed:    how much to take  when to take this  reasons to take this  Indication:  Constipation   traZODone 50 MG tablet Commonly known as:  DESYREL Take 1 tablet (50 mg total) by mouth  at bedtime as needed for sleep.  Indication:  Trouble Sleeping      Follow-up Information    [facility] Follow up on 11/04/2018.   Why:  Therapy  appointment is Wednesday, 4/15 at 8:30a.  At this time the appointment will be conducted over the telephone. The therapist will contact the patient.  Contact information: [address] [place] [address] [phone]        [facility] Follow up on 11/04/2018.   Why:  Medication management appointment with Nurse Practioner Enice is Wednesday, 4/15 at 1:00p.  At this time, the appointment will be conducted over the telephone. The Nurse Practioner will contact the patient.  Contact information: [address] [place] [address] Phone: [phone] Fax: [phone]          Follow-up recommendations: Activity as tolerated. Diet as recommended by primary care physician. Keep all scheduled follow-up appointments as recommended.   Comments:   Patient is instructed to take all prescribed medications as recommended. Report any side effects or adverse reactions to your outpatient psychiatrist. Patient is instructed to abstain from alcohol and illegal drugs while on prescription medications. In the event of worsening symptoms, patient is instructed to call the crisis hotline, 911, or go to the nearest emergency department for evaluation and  treatment.  Signed: Aldean Baker, NP 11/02/2018, 2:02 PM

## 2018-11-02 NOTE — Progress Notes (Signed)
1:1 Nursing note - Patient is currently up and dressing for the day. 1:1 continues and patient is safe with sitter at his side.

## 2018-11-02 NOTE — [facility] Suicide Risk Assessment (Signed)
[facility] INPATIENT:  Family/Significant Other Suicide Prevention Education  Suicide Prevention Education:  Education Completed; mother Edward [name] [phone] has been identified by the patient as the family member/significant other with whom the patient will be residing, and identified as the person(s) who will aid the patient in the event of a mental health crisis (suicidal ideations/suicide attempt).  With written consent from the patient, the family member/significant other has been provided the following suicide prevention education, prior to the and/or following the discharge of the patient.  The suicide prevention education provided includes the following:  Suicide risk factors  Suicide prevention and interventions  National Suicide Hotline telephone number  [facility] assessment telephone number  [facility] Emergency Assistance 911  [facility] and/or Residential Mobile Crisis Unit telephone number  Request made of family/significant other to:  Remove weapons (e.g., guns, rifles, knives), all items previously/currently identified as safety concern.    Remove drugs/medications (over-the-counter, prescriptions, illicit drugs), all items previously/currently identified as a safety concern.  The family member/significant other verbalizes understanding of the suicide prevention education information provided.  The family member/significant other agrees to remove the items of safety concern listed above.   Mother has no specific safety concerns for patient and does not believe that patient is actively suicidal. She is agreeable to the patient discharging home today.   Mother reports patient had a recent medication change, which gave him "bad dreams," she wonders if the medications interfered with the opioid pain medications he took.   Edward [name] 11/02/2018, 1:28 PM

## 2018-11-02 NOTE — Plan of Care (Signed)
  Problem: Education: Goal: Emotional status will improve Outcome: Progressing Goal: Mental status will improve Outcome: Progressing Goal: Verbalization of understanding the information provided will improve Outcome: Progressing   Problem: Activity: Goal: Interest or engagement in activities will improve Outcome: Progressing   

## 2018-11-02 NOTE — Plan of Care (Signed)
  Problem: Education: Goal: Knowledge of [facility] Education information/materials will improve Outcome: Adequate for Discharge   Problem: Education: Goal: Emotional status will improve 11/02/2018 1416 by Dewayne Shorter, RN Outcome: Adequate for Discharge 11/02/2018 0850 by Dewayne Shorter, RN Outcome: Progressing   Problem: Education: Goal: Mental status will improve 11/02/2018 1416 by Dewayne Shorter, RN Outcome: Adequate for Discharge 11/02/2018 0850 by Dewayne Shorter, RN Outcome: Progressing   Problem: Education: Goal: Verbalization of understanding the information provided will improve 11/02/2018 1416 by Dewayne Shorter, RN Outcome: Adequate for Discharge 11/02/2018 0850 by Dewayne Shorter, RN Outcome: Progressing   Problem: Activity: Goal: Interest or engagement in activities will improve 11/02/2018 1416 by Dewayne Shorter, RN Outcome: Adequate for Discharge 11/02/2018 0850 by Dewayne Shorter, RN Outcome: Progressing

## 2018-11-02 NOTE — Progress Notes (Signed)
1:1 Nursing note - Patient is currently lying in bed asleep no distress noted. Safety maintained with 15 min checks. Patient is 1:1 while awake.

## 2018-11-02 NOTE — Tx Team (Signed)
Interdisciplinary Treatment and Diagnostic Plan Update  11/02/2018 Time of Session: 12:00pm Edward HolmesJoseph G [name] MRN: [id number]  Principal Diagnosis: MDD (major depressive disorder), recurrent episode (HCC)  Secondary Diagnoses: Principal Problem:   MDD (major depressive disorder), recurrent episode (HCC)   Current Medications:  Current Facility-Administered Medications  Medication Dose Route Frequency Provider Last Rate Last Dose  . acetaminophen (TYLENOL) tablet 650 mg  650 mg Oral Q6H PRN [name], Edward N, NP   650 mg at 11/01/18 2117  . alum & mag hydroxide-simeth (MAALOX/MYLANTA) 200-200-20 MG/5ML suspension 30 mL  30 mL Oral Q4H PRN [name], Edward N, NP      . baclofen (LIORESAL) tablet 10 mg  10 mg Oral QID [name], Edward A, NP   10 mg at 11/02/18 1223  . gabapentin (NEURONTIN) capsule 600 mg  600 mg Oral TID [name], Edward A, NP   600 mg at 11/02/18 1223  . hydrOXYzine (ATARAX/VISTARIL) tablet 25 mg  25 mg Oral TID PRN [name], Edward N, NP      . LORazepam (ATIVAN) tablet 1 mg  1 mg Oral TID [name], Edward A, NP   1 mg at 11/02/18 1223  . magnesium hydroxide (MILK OF MAGNESIA) suspension 30 mL  30 mL Oral Daily PRN [name], Edward N, NP      . Melatonin TABS 6 mg  6 mg Oral QHS PRN [name], Edward A, NP      . pantoprazole (PROTONIX) EC tablet 20 mg  20 mg Oral Daily [name], Edward A, NP   20 mg at 11/02/18 0802  . polyethylene glycol (MIRALAX / GLYCOLAX) packet 17 g  17 g Oral Daily PRN [name], Edward A, NP      . senna-docusate (Senokot-S) tablet 1 tablet  1 tablet Oral Daily PRN [name], Edward A, NP      . traZODone (DESYREL) tablet 50 mg  50 mg Oral QHS PRN [name], Edward N, NP   50 mg at 11/01/18 2117   PTA Medications: Medications Prior to Admission  Medication Sig Dispense Refill Last Dose  . baclofen (LIORESAL) 10 MG tablet Take 1 tablet (10 mg total) by mouth 4 (four) times daily. 30 each 0   . gabapentin (NEURONTIN) 300 MG capsule Take 2 capsules (600 mg total) by mouth 3 (three) times daily  for 30 days. 180 capsule 0   . LORazepam (ATIVAN) 1 MG tablet Take 1 tablet (1 mg total) by mouth 3 (three) times daily. 15 tablet 0   . Melatonin 3 MG TABS Take 2 tablets (6 mg total) by mouth at bedtime. (Patient taking differently: Take 6 mg by mouth at bedtime as needed (sleep). ) 60 tablet 0 Past Week at Unknown time  . pantoprazole (PROTONIX) 20 MG tablet TAKE 1 TABLET(20 MG) BY MOUTH AT BEDTIME (Patient taking differently: Take 20 mg by mouth daily. TAKE 1 TABLET(20 MG) BY MOUTH AT BEDTIME) 90 tablet 1 10/22/2018 at Unknown time  . polyethylene glycol (MIRALAX / GLYCOLAX) packet Take 17 g by mouth daily. (Patient taking differently: Take 17 g by mouth daily as needed for mild constipation. ) 30 each 3 Past Week at Unknown time  . senna-docusate (SENOKOT-S) 8.6-50 MG tablet Take 2 tablets by mouth every other day. (Patient taking differently: Take 1 tablet by mouth daily as needed for mild constipation. ) 60 tablet 1 Past Week at Unknown time    Patient Stressors: Health problems Medication change or noncompliance  Patient Strengths: Average or above average intelligence Communication skills Motivation for treatment/growth  Supportive family/friends  Treatment Modalities: Medication Management, Group therapy, Case management,  1 to 1 session with clinician, Psychoeducation, Recreational therapy.   Physician Treatment Plan for Primary Diagnosis: MDD (major depressive disorder), recurrent episode (HCC) Long Term Goal(s): Improvement in symptoms so as ready for discharge Improvement in symptoms so as ready for discharge   Short Term Goals: Ability to identify changes in lifestyle to reduce recurrence of condition will improve Ability to verbalize feelings will improve Ability to disclose and discuss suicidal ideas Ability to demonstrate self-control will improve Ability to identify changes in lifestyle to reduce recurrence of condition will improve Ability to disclose and discuss  suicidal ideas Ability to demonstrate self-control will improve Compliance with prescribed medications will improve Ability to identify triggers associated with substance abuse/mental health issues will improve  Medication Management: Evaluate patient's response, side effects, and tolerance of medication regimen.  Therapeutic Interventions: 1 to 1 sessions, Unit Group sessions and Medication administration.  Evaluation of Outcomes: Adequate for Discharge  Physician Treatment Plan for Secondary Diagnosis: Principal Problem:   MDD (major depressive disorder), recurrent episode (HCC)  Long Term Goal(s): Improvement in symptoms so as ready for discharge Improvement in symptoms so as ready for discharge   Short Term Goals: Ability to identify changes in lifestyle to reduce recurrence of condition will improve Ability to verbalize feelings will improve Ability to disclose and discuss suicidal ideas Ability to demonstrate self-control will improve Ability to identify changes in lifestyle to reduce recurrence of condition will improve Ability to disclose and discuss suicidal ideas Ability to demonstrate self-control will improve Compliance with prescribed medications will improve Ability to identify triggers associated with substance abuse/mental health issues will improve     Medication Management: Evaluate patient's response, side effects, and tolerance of medication regimen.  Therapeutic Interventions: 1 to 1 sessions, Unit Group sessions and Medication administration.  Evaluation of Outcomes: Adequate for Discharge   RN Treatment Plan for Primary Diagnosis: MDD (major depressive disorder), recurrent episode (HCC) Long Term Goal(s): Knowledge of disease and therapeutic regimen to maintain health will improve  Short Term Goals: Ability to demonstrate self-control, Ability to disclose and discuss suicidal ideas, Ability to identify and develop effective coping behaviors will improve and  Compliance with prescribed medications will improve  Medication Management: RN will administer medications as ordered by provider, will assess and evaluate patient's response and provide education to patient for prescribed medication. RN will report any adverse and/or side effects to prescribing provider.  Therapeutic Interventions: 1 on 1 counseling sessions, Psychoeducation, Medication administration, Evaluate responses to treatment, Monitor vital signs and CBGs as ordered, Perform/monitor CIWA, COWS, AIMS and Fall Risk screenings as ordered, Perform wound care treatments as ordered.  Evaluation of Outcomes: Adequate for Discharge   LCSW Treatment Plan for Primary Diagnosis: MDD (major depressive disorder), recurrent episode (HCC) Long Term Goal(s): Safe transition to appropriate next level of care at discharge, Engage patient in therapeutic group addressing interpersonal concerns.  Short Term Goals: Engage patient in aftercare planning with referrals and resources, Increase social support, Identify triggers associated with mental health/substance abuse issues and Increase skills for wellness and recovery  Therapeutic Interventions: Assess for all discharge needs, 1 to 1 time with Social worker, Explore available resources and support systems, Assess for adequacy in community support network, Educate family and significant other(s) on suicide prevention, Complete Psychosocial Assessment, Interpersonal group therapy.  Evaluation of Outcomes: Adequate for Discharge   Progress in Treatment: Attending groups: Yes. Participating in groups: Yes. Taking medication as prescribed:  Yes. Toleration medication: Yes. Family/Significant other contact made: Yes, individual(s) contacted:  mother Patient understands diagnosis: Yes. Discussing patient identified problems/goals with staff: Yes. Medical problems stabilized or resolved: Yes. Denies suicidal/homicidal ideation: Yes. Issues/concerns per  patient self-inventory: No.  New problem(s) identified: No, Describe:  none  New Short Term/Long Term Goal(s): medication management for mood stabilization; elimination of SI thoughts; development of comprehensive mental wellness/sobriety plan.  Patient Goals:  Wants help for depression, medication management for negative side effects/bad dreams.  Discharge Plan or Barriers: Discharge home, follow up with Dr.Schwartz for medication management, [facility] for therapy.  Reason for Continuation of Hospitalization: Anxiety Depression  Estimated Length of Stay: discharge today   Attendees: Patient: Kenston Klotz 11/02/2018 1:46 PM  Physician:  11/02/2018 1:46 PM  Nursing:  11/02/2018 1:46 PM  RN Care Manager: 11/02/2018 1:46 PM  Social Worker: Enid Cutter, LCSWA 11/02/2018 1:46 PM  Recreational Therapist:  11/02/2018 1:46 PM  Other:  11/02/2018 1:46 PM  Other:  11/02/2018 1:46 PM  Other: 11/02/2018 1:46 PM    Scribe for Treatment Team: Darreld Mclean, LCSWA 11/02/2018 1:46 PM

## 2018-11-02 NOTE — Progress Notes (Signed)
Discharge note: Patient reviewed discharge paperwork with RN including prescriptions, follow up appointments, and lab work. Patient given the opportunity to ask questions. All concerns were addressed. All belongings were returned to patient. Denied SI/HI/AVH. Patient thanked staff for their care while at the hospital.  Patient was discharged to lobby. His son picked him up.

## 2018-11-02 NOTE — [facility] Suicide Risk Assessment (Signed)
[facility] Discharge Suicide Risk Assessment   Principal Problem: MDD (major depressive disorder), recurrent episode (HCC) Discharge Diagnoses: Principal Problem:   MDD (major depressive disorder), recurrent episode (HCC)   Total Time spent with patient: 15 minutes  Musculoskeletal: Strength & Muscle Tone: within normal limits Gait & Station: unable to stand Patient leans: N/A  Psychiatric Specialty Exam: Review of Systems  Musculoskeletal: Positive for back pain, myalgias and neck pain.  Neurological: Positive for focal weakness and weakness.  All other systems reviewed and are negative.   Blood pressure 99/61, pulse 75, temperature 98.3 F (36.8 C), temperature source Oral, resp. rate 16, height 6' (1.829 m), weight 67.6 kg, SpO2 99 %.Body mass index is 20.21 kg/m.  General Appearance: Casual  Eye Contact::  Good  Speech:  Normal Rate409  Volume:  Normal  Mood:  Euthymic  Affect:  Congruent  Thought Process:  Coherent and Descriptions of Associations: Intact  Orientation:  Full (Time, Place, and Person)  Thought Content:  Logical  Suicidal Thoughts:  No  Homicidal Thoughts:  No  Memory:  Immediate;   Fair Recent;   Fair Remote;   Fair  Judgement:  Fair  Insight:  Fair  Psychomotor Activity:  Decreased  Concentration:  Good  Recall:  [employer] of Knowledge:Fair  Language: Good  Akathisia:  Negative  Handed:  Right  AIMS (if indicated):     Assets:  Desire for Improvement Housing Resilience  Sleep:  Number of Hours: 6.75  Cognition: WNL  ADL's:  Intact   Mental Status Per Nursing Assessment::   On Admission:  Suicidal ideation indicated by others  Demographic Factors:  Male, Caucasian, Low socioeconomic status, Living alone and Unemployed  Loss Factors: Financial problems/change in socioeconomic status  Historical Factors: Impulsivity  Risk Reduction Factors:   Sense of responsibility to family and Positive social support  Continued Clinical Symptoms:   Depression:   Impulsivity Chronic Pain Previous Psychiatric Diagnoses and Treatments Medical Diagnoses and Treatments/Surgeries  Cognitive Features That Contribute To Risk:  None    Suicide Risk:  Minimal: No identifiable suicidal ideation.  Patients presenting with no risk factors but with morbid ruminations; may be classified as minimal risk based on the severity of the depressive symptoms  Follow-up Information    Medicine, [facility].   Specialty:  Family Medicine Why:  Primary care physician needed - is asking for therapy also. Perhaps they have a therapist imbedded?  THIS MAY NOT BE THE CORRECT [facility] BUT IT WAS LISTED AT MEDICAL UNIT D/C. Contact information: [address] [place] R[place] [address] [phone]           Plan Of Care/Follow-up recommendations:  Activity:  ad lib  Antonieta Pert, MD 11/02/2018, 1:31 PM

## 2018-11-02 NOTE — Progress Notes (Signed)
  [facility] Adult Case Management Discharge Plan :  Will you be returning to the same living situation after discharge:  Yes,  home At discharge, do you have transportation home?: Yes,  son picking up Do you have the ability to pay for your medications: Yes,  Medicaid  Release of information consent forms completed and in the chart. Patient to Follow up at: Follow-up Information    [facility] Follow up on 11/04/2018.   Why:  Therapy  appointment is Wednesday, 4/15 at 8:30a.  At this time the appointment will be conducted over the telephone. The therapist will contact the patient.  Contact information: [address] [place] [address] [phone]        [facility] Follow up on 11/04/2018.   Why:  Medication management appointment with Nurse Practioner Enice is Wednesday, 4/15 at 1:00p.  At this time, the appointment will be conducted over the telephone. The Nurse Practioner will contact the patient.  Contact information: [address] [place] [address] Phone: [phone] Fax: [phone]          Next level of care provider has access to [facility] Link:yes  Safety Planning and Suicide Prevention discussed: Yes,  with mother  Have you used any form of tobacco in the last 30 days? (Cigarettes, Smokeless Tobacco, Cigars, and/or Pipes): No  Has patient been referred to the Quitline?: N/A patient is not a smoker  Patient has been referred for addiction treatment: Yes  Darreld Mclean, LCSWA 11/02/2018, 1:59 PM

## 2018-11-04 ENCOUNTER — Encounter: Payer: Medicaid Other | Attending: Physical Medicine & Rehabilitation | Admitting: Registered Nurse

## 2018-11-04 ENCOUNTER — Encounter: Payer: Self-pay | Admitting: Registered Nurse

## 2018-11-04 ENCOUNTER — Other Ambulatory Visit: Payer: Self-pay

## 2018-11-04 VITALS — Ht 72.0 in | Wt 147.0 lb

## 2018-11-04 DIAGNOSIS — K592 Neurogenic bowel, not elsewhere classified: Secondary | ICD-10-CM | POA: Insufficient documentation

## 2018-11-04 DIAGNOSIS — Z7901 Long term (current) use of anticoagulants: Secondary | ICD-10-CM | POA: Insufficient documentation

## 2018-11-04 DIAGNOSIS — F329 Major depressive disorder, single episode, unspecified: Secondary | ICD-10-CM

## 2018-11-04 DIAGNOSIS — G8929 Other chronic pain: Secondary | ICD-10-CM | POA: Diagnosis not present

## 2018-11-04 DIAGNOSIS — S14101S Unspecified injury at C1 level of cervical spinal cord, sequela: Secondary | ICD-10-CM

## 2018-11-04 DIAGNOSIS — M62838 Other muscle spasm: Secondary | ICD-10-CM

## 2018-11-04 DIAGNOSIS — M545 Low back pain: Secondary | ICD-10-CM

## 2018-11-04 DIAGNOSIS — F419 Anxiety disorder, unspecified: Secondary | ICD-10-CM | POA: Insufficient documentation

## 2018-11-04 DIAGNOSIS — G8250 Quadriplegia, unspecified: Secondary | ICD-10-CM

## 2018-11-04 DIAGNOSIS — Z981 Arthrodesis status: Secondary | ICD-10-CM | POA: Insufficient documentation

## 2018-11-04 DIAGNOSIS — G894 Chronic pain syndrome: Secondary | ICD-10-CM | POA: Diagnosis not present

## 2018-11-04 DIAGNOSIS — M542 Cervicalgia: Secondary | ICD-10-CM | POA: Diagnosis not present

## 2018-11-04 DIAGNOSIS — F17200 Nicotine dependence, unspecified, uncomplicated: Secondary | ICD-10-CM | POA: Insufficient documentation

## 2018-11-04 DIAGNOSIS — Z79899 Other long term (current) drug therapy: Secondary | ICD-10-CM

## 2018-11-04 DIAGNOSIS — Z5181 Encounter for therapeutic drug level monitoring: Secondary | ICD-10-CM | POA: Diagnosis not present

## 2018-11-04 DIAGNOSIS — M792 Neuralgia and neuritis, unspecified: Secondary | ICD-10-CM

## 2018-11-04 DIAGNOSIS — S14104S Unspecified injury at C4 level of cervical spinal cord, sequela: Secondary | ICD-10-CM | POA: Insufficient documentation

## 2018-11-04 DIAGNOSIS — R252 Cramp and spasm: Secondary | ICD-10-CM | POA: Insufficient documentation

## 2018-11-04 DIAGNOSIS — N319 Neuromuscular dysfunction of bladder, unspecified: Secondary | ICD-10-CM | POA: Insufficient documentation
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
## 2018-11-10 DIAGNOSIS — F329 Major depressive disorder, single episode, unspecified: Secondary | ICD-10-CM | POA: Diagnosis not present

## 2018-11-12 ENCOUNTER — Telehealth: Payer: Self-pay | Admitting: Registered Nurse

## 2018-11-12 NOTE — Telephone Encounter (Signed)
Dr. Riley Kill I need your in put in this matter. As you know Edward [name] had a suicidal attempt, we have discontinued his Percocet and Klonopin. I instructed him to ask his psychiatrist at [facility] to prescribe his clonazepam. He ( Edward [name]) stating they Edward [name]) won't prescribe his clonazepam. We were prescribing this for his muscle spasticity. When he was hospitalized they discontinue his Tizanidine, I can prescribe his tizanidine if you are in agreement.  I will try to call [facility] to evaluate if what he is saying is correct. The only problem is the confidentiality in psychiatry.  Thank You

## 2018-11-12 NOTE — Telephone Encounter (Signed)
This provider placed a call to [facility] to verify Mr. Jasso my chart message. Awaiting a return call from [facility]. I also sent Dr. Riley Kill a message regarding his request for Clonazepam, i awaiting his response. Especially with Mr. Hartunian recent hospitalization with Suicidal Attempt.

## 2018-11-12 NOTE — Telephone Encounter (Signed)
Received a call from [employer] psychotherapist ) from [facility]. She reports Dr. Delena Serve spoke with Mr. Ciak today, at the time of the visit Mr. Bosket denied any anxiety and depression, Mr. Bacallao reported he was having muscle spasticity in his lower extremities and asked if he would prescribe the Clonazepam. They [facility]) do not prescribe Clonazepam for muscle spasticity.

## 2018-11-26 DIAGNOSIS — F329 Major depressive disorder, single episode, unspecified: Secondary | ICD-10-CM | POA: Diagnosis not present

## 2018-12-09 DIAGNOSIS — F329 Major depressive disorder, single episode, unspecified: Secondary | ICD-10-CM | POA: Diagnosis not present

## 2018-12-13 ENCOUNTER — Other Ambulatory Visit: Payer: Self-pay | Admitting: Registered Nurse

## 2018-12-13 DIAGNOSIS — S14101S Unspecified injury at C1 level of cervical spinal cord, sequela: Secondary | ICD-10-CM

## 2018-12-13 DIAGNOSIS — N319 Neuromuscular dysfunction of bladder, unspecified: Secondary | ICD-10-CM

## 2018-12-13 DIAGNOSIS — G8250 Quadriplegia, unspecified: Secondary | ICD-10-CM

## 2018-12-17 ENCOUNTER — Other Ambulatory Visit: Payer: Self-pay | Admitting: *Deleted

## 2018-12-17 DIAGNOSIS — F329 Major depressive disorder, single episode, unspecified: Secondary | ICD-10-CM | POA: Diagnosis not present

## 2018-12-17 MED ORDER — BACLOFEN 10 MG PO TABS: 10.0000 mg | ORAL_TABLET | Freq: Four times a day (QID) | ORAL | 0 refills | Status: DC

## 2018-12-18 ENCOUNTER — Telehealth: Payer: Self-pay | Admitting: Registered Nurse

## 2018-12-18 NOTE — Telephone Encounter (Signed)
Placed a call to Mr. Weinert, he reports increase intensity and frequency muscle spasticity with lower dose of Baclofen. Purvis Sheffield refilled the last Baclofen ordered on 12/17/2018. Mr. Tomsic Baclofen dose in March was 30 mg 4 times a day. Today he was instructed to increase his Baclofen to 20 mg, he has 10 mg tablets, he will take two tablets 4 times a day, he verbalizes understanding. Also instructed to call office if he develops any adverse effects, he verbalizes understanding.  Mr. Vidaurri continues with counseling at [facility] he states.

## 2019-01-06 DIAGNOSIS — F329 Major depressive disorder, single episode, unspecified: Secondary | ICD-10-CM | POA: Diagnosis not present

## 2019-01-27 DIAGNOSIS — F329 Major depressive disorder, single episode, unspecified: Secondary | ICD-10-CM | POA: Diagnosis not present

## 2019-02-02 ENCOUNTER — Encounter: Payer: Medicaid Other | Admitting: Registered Nurse

## 2019-02-05 ENCOUNTER — Encounter: Payer: Medicaid Other | Admitting: Registered Nurse

## 2019-02-09 ENCOUNTER — Emergency Department (HOSPITAL_COMMUNITY)
Admission: EM | Admit: 2019-02-09 | Discharge: 2019-02-09 | Disposition: A | Discharge status: Home / Self Care | Payer: Medicaid Other | Attending: Emergency Medicine | Admitting: Emergency Medicine

## 2019-02-09 ENCOUNTER — Encounter (HOSPITAL_COMMUNITY): Payer: Self-pay | Admitting: Emergency Medicine

## 2019-02-09 ENCOUNTER — Other Ambulatory Visit: Payer: Self-pay

## 2019-02-09 DIAGNOSIS — Z20828 Contact with and (suspected) exposure to other viral communicable diseases: Secondary | ICD-10-CM | POA: Insufficient documentation

## 2019-02-09 DIAGNOSIS — K2900 Acute gastritis without bleeding: Secondary | ICD-10-CM | POA: Insufficient documentation

## 2019-02-09 DIAGNOSIS — F329 Major depressive disorder, single episode, unspecified: Secondary | ICD-10-CM | POA: Diagnosis not present

## 2019-02-09 DIAGNOSIS — Z79899 Other long term (current) drug therapy: Secondary | ICD-10-CM | POA: Insufficient documentation

## 2019-02-09 DIAGNOSIS — F17210 Nicotine dependence, cigarettes, uncomplicated: Secondary | ICD-10-CM | POA: Insufficient documentation

## 2019-02-09 DIAGNOSIS — R1013 Epigastric pain: Secondary | ICD-10-CM | POA: Diagnosis present

## 2019-02-09 DIAGNOSIS — Z77120 Contact with and (suspected) exposure to mold (toxic): Secondary | ICD-10-CM | POA: Insufficient documentation

## 2019-02-09 DIAGNOSIS — Z03818 Encounter for observation for suspected exposure to other biological agents ruled out: Secondary | ICD-10-CM | POA: Diagnosis not present

## 2019-02-09 LAB — COMPREHENSIVE METABOLIC PANEL
ALT: 15 U/L (ref 0–44)
AST: 15 U/L (ref 15–41)
Albumin: 4.7 g/dL (ref 3.5–5.0)
Alkaline Phosphatase: 68 U/L (ref 38–126)
Anion gap: 9 (ref 5–15)
BUN: 12 mg/dL (ref 6–20)
CO2: 25 mmol/L (ref 22–32)
Calcium: 9.5 mg/dL (ref 8.9–10.3)
Chloride: 109 mmol/L (ref 98–111)
Creatinine, Ser: 0.91 mg/dL (ref 0.61–1.24)
GFR calc Af Amer: >60 mL/min (ref >60)
GFR calc non Af Amer: >60 mL/min (ref >60)
Glucose, Bld: 106 mg/dL — ABNORMAL HIGH (ref 70–99)
Potassium: 3.2 mmol/L — ABNORMAL LOW (ref 3.5–5.1)
Sodium: 143 mmol/L (ref 135–145)
Total Bilirubin: 0.5 mg/dL (ref 0.3–1.2)
Total Protein: 7.5 g/dL (ref 6.5–8.1)

## 2019-02-09 LAB — CBC
HCT: 45 % (ref 39.0–52.0)
Hemoglobin: 14.8 g/dL (ref 13.0–17.0)
MCH: 32.2 pg (ref 26.0–34.0)
MCHC: 32.9 g/dL (ref 30.0–36.0)
MCV: 97.8 fL (ref 80.0–100.0)
Platelets: 236 10*3/uL (ref 150–400)
RBC: 4.6 MIL/uL (ref 4.22–5.81)
RDW: 12.1 % (ref 11.5–15.5)
WBC: 7.2 10*3/uL (ref 4.0–10.5)
nRBC: 0 % (ref 0.0–0.2)

## 2019-02-09 LAB — LIPASE, BLOOD: Lipase: 29 U/L (ref 11–51)

## 2019-02-09 MED ORDER — ONDANSETRON 4 MG PO TBDP
4.0000 mg | ORAL_TABLET | Freq: Once | ORAL | Status: AC
  Administered 2019-02-09: 4 mg via ORAL
  Filled 2019-02-09: qty 1

## 2019-02-09 MED ORDER — SODIUM CHLORIDE 0.9% FLUSH: 3.0000 mL | Freq: Once | INTRAVENOUS | Status: DC

## 2019-02-09 MED ORDER — SUCRALFATE 1 G PO TABS: 1.0000 g | ORAL_TABLET | Freq: Three times a day (TID) | ORAL | 0 refills | Status: DC

## 2019-02-09 NOTE — ED Triage Notes (Addendum)
Pt states he is concerned he may have mold poisoning. Pt states he has a window unit that had mold all in it. Pt started having sharp abd pains last week and has been vomiting. Denies diarrhea. Pt also complains of itchy/burning sensation in his eyes as well. Pt states he has a white coating on his tongue
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
## 2019-02-09 NOTE — ED Notes (Signed)
Pt unable to use bathroom at this time. 

## 2019-02-09 NOTE — Telephone Encounter (Signed)
Message from patient

## 2019-02-09 NOTE — Discharge Instructions (Addendum)
Follow up with your doctor.  Return here as needed 

## 2019-02-10 ENCOUNTER — Ambulatory Visit: Payer: Medicaid Other | Admitting: Registered Nurse

## 2019-02-10 LAB — NOVEL CORONAVIRUS, NAA (HOSP ORDER, SEND-OUT TO REF LAB; TAT 18-24 HRS): SARS-CoV-2, NAA: NOT DETECTED

## 2019-02-10 LAB — HIV ANTIBODY (ROUTINE TESTING W REFLEX): HIV Screen 4th Generation wRfx: NONREACTIVE

## 2019-02-11 ENCOUNTER — Telehealth: Payer: Self-pay | Admitting: Registered Nurse

## 2019-02-11 MED ORDER — TIZANIDINE HCL 4 MG PO TABS: ORAL_TABLET | ORAL | 1 refills | Status: DC

## 2019-02-11 MED ORDER — BACLOFEN 20 MG PO TABS: 20.0000 mg | ORAL_TABLET | Freq: Four times a day (QID) | ORAL | 2 refills | Status: DC

## 2019-02-11 NOTE — Telephone Encounter (Signed)
Placed a call to Mr. Hicks regarding his Tizanidine, his tizanidine was discontinue on 10/31/2018 post discharge by Dr. Quillian Quince. Mr. Faul reports he has been taking his Tizanidine three times a day.  [facility] was called, his last prescription of tizanidine was filled on 01/07/2019 Tizanidine 4 mg tablets , take two tablets three times a day by Dr. Naaman Plummer. We will prescribe Tizanidine. Mr. Incorvaia is aware and verbalizes understanding.

## 2019-02-24 DIAGNOSIS — F329 Major depressive disorder, single episode, unspecified: Secondary | ICD-10-CM | POA: Diagnosis not present

## 2019-03-18 ENCOUNTER — Encounter: Payer: Medicaid Other | Attending: Registered Nurse | Admitting: Registered Nurse

## 2019-03-18 ENCOUNTER — Encounter: Payer: Self-pay | Admitting: Registered Nurse

## 2019-03-18 ENCOUNTER — Other Ambulatory Visit: Payer: Self-pay

## 2019-03-18 VITALS — BP 120/85 | HR 84 | Temp 98.8°F | Ht 72.0 in | Wt 153.0 lb

## 2019-03-18 DIAGNOSIS — Z5181 Encounter for therapeutic drug level monitoring: Secondary | ICD-10-CM | POA: Diagnosis not present

## 2019-03-18 DIAGNOSIS — M62838 Other muscle spasm: Secondary | ICD-10-CM

## 2019-03-18 DIAGNOSIS — G894 Chronic pain syndrome: Secondary | ICD-10-CM

## 2019-03-18 DIAGNOSIS — F329 Major depressive disorder, single episode, unspecified: Secondary | ICD-10-CM | POA: Diagnosis not present

## 2019-03-18 DIAGNOSIS — G8250 Quadriplegia, unspecified: Secondary | ICD-10-CM

## 2019-03-18 DIAGNOSIS — M545 Low back pain, unspecified: Secondary | ICD-10-CM

## 2019-03-18 DIAGNOSIS — S14101S Unspecified injury at C1 level of cervical spinal cord, sequela: Secondary | ICD-10-CM

## 2019-03-18 DIAGNOSIS — M792 Neuralgia and neuritis, unspecified: Secondary | ICD-10-CM

## 2019-03-18 DIAGNOSIS — Z79899 Other long term (current) drug therapy: Secondary | ICD-10-CM

## 2019-03-18 DIAGNOSIS — G8929 Other chronic pain: Secondary | ICD-10-CM | POA: Diagnosis not present

## 2019-03-18 DIAGNOSIS — M542 Cervicalgia: Secondary | ICD-10-CM | POA: Diagnosis not present

## 2019-03-18 MED ORDER — GABAPENTIN 300 MG PO CAPS: 600.0000 mg | ORAL_CAPSULE | Freq: Four times a day (QID) | ORAL | 0 refills | Status: DC
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
## 2019-03-31 DIAGNOSIS — F329 Major depressive disorder, single episode, unspecified: Secondary | ICD-10-CM | POA: Diagnosis not present

## 2019-04-05 DIAGNOSIS — F329 Major depressive disorder, single episode, unspecified: Secondary | ICD-10-CM | POA: Diagnosis not present

## 2019-04-15 ENCOUNTER — Other Ambulatory Visit: Payer: Self-pay | Admitting: Registered Nurse

## 2019-05-06 DIAGNOSIS — F329 Major depressive disorder, single episode, unspecified: Secondary | ICD-10-CM | POA: Diagnosis not present

## 2019-05-20 ENCOUNTER — Other Ambulatory Visit: Payer: Self-pay | Admitting: Registered Nurse

## 2019-06-07 DIAGNOSIS — H16223 Keratoconjunctivitis sicca, not specified as Sjogren's, bilateral: Secondary | ICD-10-CM | POA: Diagnosis not present

## 2019-06-07 DIAGNOSIS — D2310 Other benign neoplasm of skin of unspecified eyelid, including canthus: Secondary | ICD-10-CM | POA: Diagnosis not present

## 2019-06-10 ENCOUNTER — Other Ambulatory Visit: Payer: Self-pay

## 2019-06-10 ENCOUNTER — Encounter: Payer: Medicaid Other | Attending: Registered Nurse | Admitting: Registered Nurse

## 2019-06-10 VITALS — BP 126/73 | HR 92 | Temp 98.1°F | Ht 72.0 in | Wt 156.8 lb

## 2019-06-10 DIAGNOSIS — Z79899 Other long term (current) drug therapy: Secondary | ICD-10-CM | POA: Insufficient documentation

## 2019-06-10 DIAGNOSIS — G894 Chronic pain syndrome: Secondary | ICD-10-CM | POA: Insufficient documentation

## 2019-06-10 DIAGNOSIS — S14101S Unspecified injury at C1 level of cervical spinal cord, sequela: Secondary | ICD-10-CM | POA: Insufficient documentation

## 2019-06-10 DIAGNOSIS — Z5181 Encounter for therapeutic drug level monitoring: Secondary | ICD-10-CM | POA: Diagnosis not present

## 2019-06-10 DIAGNOSIS — M62838 Other muscle spasm: Secondary | ICD-10-CM | POA: Insufficient documentation

## 2019-06-10 DIAGNOSIS — G8250 Quadriplegia, unspecified: Secondary | ICD-10-CM | POA: Insufficient documentation

## 2019-06-10 DIAGNOSIS — M542 Cervicalgia: Secondary | ICD-10-CM | POA: Insufficient documentation

## 2019-06-10 DIAGNOSIS — G8929 Other chronic pain: Secondary | ICD-10-CM | POA: Diagnosis not present

## 2019-06-10 DIAGNOSIS — M545 Low back pain: Secondary | ICD-10-CM | POA: Insufficient documentation

## 2019-06-10 DIAGNOSIS — M792 Neuralgia and neuritis, unspecified: Secondary | ICD-10-CM | POA: Diagnosis not present

## 2019-06-10 DIAGNOSIS — F329 Major depressive disorder, single episode, unspecified: Secondary | ICD-10-CM | POA: Diagnosis not present
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
## 2019-06-14 ENCOUNTER — Encounter: Payer: Self-pay | Admitting: Registered Nurse

## 2019-06-15 ENCOUNTER — Other Ambulatory Visit: Payer: Self-pay | Admitting: Registered Nurse

## 2019-06-22 DIAGNOSIS — R110 Nausea: Secondary | ICD-10-CM | POA: Diagnosis not present

## 2019-06-22 DIAGNOSIS — Z77120 Contact with and (suspected) exposure to mold (toxic): Secondary | ICD-10-CM | POA: Diagnosis not present

## 2019-06-22 DIAGNOSIS — B370 Candidal stomatitis: Secondary | ICD-10-CM | POA: Diagnosis not present

## 2019-06-28 DIAGNOSIS — F329 Major depressive disorder, single episode, unspecified: Secondary | ICD-10-CM | POA: Diagnosis not present

## 2019-07-06 DIAGNOSIS — B370 Candidal stomatitis: Secondary | ICD-10-CM | POA: Diagnosis not present

## 2019-08-06 ENCOUNTER — Other Ambulatory Visit: Payer: Self-pay | Admitting: Registered Nurse

## 2019-08-06 ENCOUNTER — Other Ambulatory Visit: Payer: Self-pay | Admitting: Physical Medicine & Rehabilitation

## 2019-09-22 ENCOUNTER — Encounter: Payer: Self-pay | Admitting: Physical Medicine & Rehabilitation

## 2019-09-22 ENCOUNTER — Other Ambulatory Visit: Payer: Self-pay

## 2019-09-22 ENCOUNTER — Encounter: Payer: Medicaid Other | Attending: Physical Medicine & Rehabilitation | Admitting: Physical Medicine & Rehabilitation

## 2019-09-22 VITALS — BP 120/70 | HR 82 | Temp 97.7°F | Ht 72.0 in | Wt 164.0 lb

## 2019-09-22 DIAGNOSIS — M792 Neuralgia and neuritis, unspecified: Secondary | ICD-10-CM | POA: Diagnosis not present

## 2019-09-22 DIAGNOSIS — N319 Neuromuscular dysfunction of bladder, unspecified: Secondary | ICD-10-CM | POA: Diagnosis not present

## 2019-09-22 DIAGNOSIS — G8250 Quadriplegia, unspecified: Secondary | ICD-10-CM

## 2019-09-22 DIAGNOSIS — S14101S Unspecified injury at C1 level of cervical spinal cord, sequela: Secondary | ICD-10-CM

## 2019-09-22 NOTE — Patient Instructions (Addendum)
PLEASE FEEL FREE TO CALL OUR OFFICE WITH ANY PROBLEMS OR QUESTIONS ([phone])      
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
## 2019-10-29 ENCOUNTER — Other Ambulatory Visit: Payer: Self-pay | Admitting: Physical Medicine & Rehabilitation

## 2020-01-01 DIAGNOSIS — Z03818 Encounter for observation for suspected exposure to other biological agents ruled out: Secondary | ICD-10-CM | POA: Diagnosis not present

## 2020-01-01 DIAGNOSIS — J00 Acute nasopharyngitis [common cold]: Secondary | ICD-10-CM | POA: Diagnosis not present

## 2020-01-26 DIAGNOSIS — Z20822 Contact with and (suspected) exposure to covid-19: Secondary | ICD-10-CM | POA: Diagnosis not present

## 2020-01-28 ENCOUNTER — Other Ambulatory Visit: Payer: Self-pay | Admitting: Physical Medicine & Rehabilitation

## 2020-03-22 ENCOUNTER — Other Ambulatory Visit: Payer: Self-pay

## 2020-03-22 ENCOUNTER — Encounter: Payer: Medicaid Other | Attending: Physical Medicine & Rehabilitation | Admitting: Physical Medicine & Rehabilitation

## 2020-03-22 ENCOUNTER — Encounter: Payer: Self-pay | Admitting: Physical Medicine & Rehabilitation

## 2020-03-22 VITALS — BP 118/76 | HR 73 | Temp 98.7°F | Ht 72.0 in | Wt 158.2 lb

## 2020-03-22 DIAGNOSIS — S14101S Unspecified injury at C1 level of cervical spinal cord, sequela: Secondary | ICD-10-CM | POA: Diagnosis not present

## 2020-03-22 DIAGNOSIS — M792 Neuralgia and neuritis, unspecified: Secondary | ICD-10-CM | POA: Diagnosis not present

## 2020-03-22 DIAGNOSIS — G8250 Quadriplegia, unspecified: Secondary | ICD-10-CM | POA: Diagnosis not present

## 2020-03-22 DIAGNOSIS — N319 Neuromuscular dysfunction of bladder, unspecified: Secondary | ICD-10-CM | POA: Diagnosis not present

## 2020-03-22 MED ORDER — BACLOFEN 20 MG PO TABS: 20.0000 mg | ORAL_TABLET | Freq: Four times a day (QID) | ORAL | 2 refills | Status: DC

## 2020-03-22 MED ORDER — TIZANIDINE HCL 4 MG PO TABS: 8.0000 mg | ORAL_TABLET | Freq: Three times a day (TID) | ORAL | 2 refills | Status: DC

## 2020-03-22 MED ORDER — GABAPENTIN 300 MG PO CAPS: 600.0000 mg | ORAL_CAPSULE | Freq: Three times a day (TID) | ORAL | 2 refills | Status: DC

## 2020-03-22 NOTE — Patient Instructions (Signed)
PLEASE FEEL FREE TO CALL OUR OFFICE WITH ANY PROBLEMS OR QUESTIONS ([phone])      
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
## 2020-05-04 DIAGNOSIS — S90211A Contusion of right great toe with damage to nail, initial encounter: Secondary | ICD-10-CM | POA: Diagnosis not present

## 2020-05-04 DIAGNOSIS — L600 Ingrowing nail: Secondary | ICD-10-CM | POA: Diagnosis not present

## 2020-05-04 DIAGNOSIS — S90212A Contusion of left great toe with damage to nail, initial encounter: Secondary | ICD-10-CM | POA: Diagnosis not present

## 2020-06-14 ENCOUNTER — Other Ambulatory Visit: Payer: Self-pay | Admitting: Physical Medicine & Rehabilitation

## 2020-06-14 DIAGNOSIS — S14101S Unspecified injury at C1 level of cervical spinal cord, sequela: Secondary | ICD-10-CM

## 2020-06-14 DIAGNOSIS — G8250 Quadriplegia, unspecified: Secondary | ICD-10-CM

## 2020-07-15 ENCOUNTER — Other Ambulatory Visit: Payer: Self-pay | Admitting: Physical Medicine & Rehabilitation

## 2020-07-15 DIAGNOSIS — G8250 Quadriplegia, unspecified: Secondary | ICD-10-CM

## 2020-07-15 DIAGNOSIS — S14101S Unspecified injury at C1 level of cervical spinal cord, sequela: Secondary | ICD-10-CM

## 2020-08-14 ENCOUNTER — Other Ambulatory Visit: Payer: Self-pay | Admitting: Physical Medicine & Rehabilitation

## 2020-08-14 DIAGNOSIS — S14101S Unspecified injury at C1 level of cervical spinal cord, sequela: Secondary | ICD-10-CM

## 2020-08-14 DIAGNOSIS — G8250 Quadriplegia, unspecified: Secondary | ICD-10-CM

## 2020-09-20 ENCOUNTER — Other Ambulatory Visit: Payer: Self-pay

## 2020-09-20 ENCOUNTER — Encounter: Payer: Self-pay | Admitting: Physical Medicine & Rehabilitation

## 2020-09-20 ENCOUNTER — Encounter: Payer: Medicaid Other | Attending: Physical Medicine & Rehabilitation | Admitting: Physical Medicine & Rehabilitation

## 2020-09-20 VITALS — BP 124/83 | HR 69 | Temp 99.0°F | Ht 72.0 in | Wt 159.2 lb

## 2020-09-20 DIAGNOSIS — M792 Neuralgia and neuritis, unspecified: Secondary | ICD-10-CM | POA: Insufficient documentation

## 2020-09-20 DIAGNOSIS — G8250 Quadriplegia, unspecified: Secondary | ICD-10-CM | POA: Insufficient documentation

## 2020-09-20 DIAGNOSIS — S14101S Unspecified injury at C1 level of cervical spinal cord, sequela: Secondary | ICD-10-CM | POA: Diagnosis not present

## 2020-09-20 MED ORDER — BACLOFEN 20 MG PO TABS: 20.0000 mg | ORAL_TABLET | Freq: Three times a day (TID) | ORAL | 2 refills | Status: DC
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
## 2020-09-20 NOTE — Patient Instructions (Signed)
PLEASE FEEL FREE TO CALL OUR OFFICE WITH ANY PROBLEMS OR QUESTIONS ([phone])      

## 2020-10-12 ENCOUNTER — Other Ambulatory Visit: Payer: Self-pay | Admitting: Physical Medicine & Rehabilitation

## 2020-10-12 DIAGNOSIS — S14101S Unspecified injury at C1 level of cervical spinal cord, sequela: Secondary | ICD-10-CM

## 2020-10-12 DIAGNOSIS — G8250 Quadriplegia, unspecified: Secondary | ICD-10-CM

## 2020-12-11 ENCOUNTER — Other Ambulatory Visit: Payer: Self-pay | Admitting: Physical Medicine & Rehabilitation

## 2020-12-11 DIAGNOSIS — G8250 Quadriplegia, unspecified: Secondary | ICD-10-CM

## 2020-12-11 DIAGNOSIS — S14101S Unspecified injury at C1 level of cervical spinal cord, sequela: Secondary | ICD-10-CM

## 2021-01-29 ENCOUNTER — Other Ambulatory Visit: Payer: Self-pay | Admitting: Physical Medicine & Rehabilitation

## 2021-01-29 DIAGNOSIS — S14101S Unspecified injury at C1 level of cervical spinal cord, sequela: Secondary | ICD-10-CM

## 2021-01-29 DIAGNOSIS — G8250 Quadriplegia, unspecified: Secondary | ICD-10-CM

## 2021-03-06 ENCOUNTER — Other Ambulatory Visit: Payer: Self-pay | Admitting: Physical Medicine & Rehabilitation

## 2021-03-06 DIAGNOSIS — G8250 Quadriplegia, unspecified: Secondary | ICD-10-CM

## 2021-03-06 DIAGNOSIS — S14101S Unspecified injury at C1 level of cervical spinal cord, sequela: Secondary | ICD-10-CM

## 2021-04-25 ENCOUNTER — Other Ambulatory Visit: Payer: Self-pay | Admitting: Physical Medicine & Rehabilitation

## 2021-04-25 DIAGNOSIS — G8250 Quadriplegia, unspecified: Secondary | ICD-10-CM

## 2021-04-25 DIAGNOSIS — S14101S Unspecified injury at C1 level of cervical spinal cord, sequela: Secondary | ICD-10-CM

## 2021-05-03 ENCOUNTER — Other Ambulatory Visit: Payer: Self-pay | Admitting: Physical Medicine & Rehabilitation

## 2021-05-03 DIAGNOSIS — S14101S Unspecified injury at C1 level of cervical spinal cord, sequela: Secondary | ICD-10-CM

## 2021-05-03 DIAGNOSIS — G8250 Quadriplegia, unspecified: Secondary | ICD-10-CM

## 2021-06-05 ENCOUNTER — Other Ambulatory Visit: Payer: Self-pay | Admitting: Physical Medicine & Rehabilitation

## 2021-06-05 DIAGNOSIS — S14101S Unspecified injury at C1 level of cervical spinal cord, sequela: Secondary | ICD-10-CM

## 2021-06-05 DIAGNOSIS — G8250 Quadriplegia, unspecified: Secondary | ICD-10-CM

## 2021-08-02 ENCOUNTER — Other Ambulatory Visit: Payer: Self-pay | Admitting: Physical Medicine & Rehabilitation

## 2021-08-02 DIAGNOSIS — G8250 Quadriplegia, unspecified: Secondary | ICD-10-CM

## 2021-08-02 DIAGNOSIS — S14101S Unspecified injury at C1 level of cervical spinal cord, sequela: Secondary | ICD-10-CM

## 2021-08-30 ENCOUNTER — Other Ambulatory Visit: Payer: Self-pay | Admitting: Physical Medicine & Rehabilitation

## 2021-08-30 DIAGNOSIS — G8250 Quadriplegia, unspecified: Secondary | ICD-10-CM

## 2021-08-30 DIAGNOSIS — S14101S Unspecified injury at C1 level of cervical spinal cord, sequela: Secondary | ICD-10-CM

## 2021-09-19 ENCOUNTER — Encounter: Payer: Medicaid Other | Attending: Physical Medicine & Rehabilitation | Admitting: Physical Medicine & Rehabilitation

## 2021-09-26 DIAGNOSIS — R339 Retention of urine, unspecified: Secondary | ICD-10-CM | POA: Diagnosis not present

## 2021-09-26 DIAGNOSIS — R1030 Lower abdominal pain, unspecified: Secondary | ICD-10-CM | POA: Diagnosis not present

## 2021-09-27 DIAGNOSIS — R339 Retention of urine, unspecified: Secondary | ICD-10-CM | POA: Diagnosis not present

## 2021-09-30 ENCOUNTER — Other Ambulatory Visit: Payer: Self-pay | Admitting: Physical Medicine & Rehabilitation

## 2021-09-30 DIAGNOSIS — G8250 Quadriplegia, unspecified: Secondary | ICD-10-CM

## 2021-09-30 DIAGNOSIS — S14101S Unspecified injury at C1 level of cervical spinal cord, sequela: Secondary | ICD-10-CM

## 2021-10-03 DIAGNOSIS — R339 Retention of urine, unspecified: Secondary | ICD-10-CM | POA: Diagnosis not present

## 2021-11-23 ENCOUNTER — Other Ambulatory Visit: Payer: Self-pay | Admitting: Physical Medicine & Rehabilitation

## 2021-11-23 DIAGNOSIS — G8250 Quadriplegia, unspecified: Secondary | ICD-10-CM

## 2021-11-23 DIAGNOSIS — S14101S Unspecified injury at C1 level of cervical spinal cord, sequela: Secondary | ICD-10-CM

## 2021-12-26 ENCOUNTER — Encounter: Payer: Medicaid Other | Attending: Physical Medicine & Rehabilitation | Admitting: Physical Medicine & Rehabilitation

## 2021-12-27 ENCOUNTER — Other Ambulatory Visit: Payer: Self-pay | Admitting: Physical Medicine & Rehabilitation

## 2021-12-27 DIAGNOSIS — G8250 Quadriplegia, unspecified: Secondary | ICD-10-CM

## 2021-12-27 DIAGNOSIS — S14101S Unspecified injury at C1 level of cervical spinal cord, sequela: Secondary | ICD-10-CM

## 2022-02-25 ENCOUNTER — Other Ambulatory Visit: Payer: Self-pay | Admitting: Physical Medicine & Rehabilitation

## 2022-02-25 DIAGNOSIS — S14101S Unspecified injury at C1 level of cervical spinal cord, sequela: Secondary | ICD-10-CM

## 2022-02-25 DIAGNOSIS — G8250 Quadriplegia, unspecified: Secondary | ICD-10-CM

## 2022-05-03 ENCOUNTER — Other Ambulatory Visit: Payer: Self-pay | Admitting: Physical Medicine & Rehabilitation

## 2022-05-03 DIAGNOSIS — G8250 Quadriplegia, unspecified: Secondary | ICD-10-CM

## 2022-05-03 DIAGNOSIS — S14101S Unspecified injury at C1 level of cervical spinal cord, sequela: Secondary | ICD-10-CM

## 2022-05-30 ENCOUNTER — Other Ambulatory Visit: Payer: Self-pay | Admitting: Physical Medicine & Rehabilitation

## 2022-05-30 DIAGNOSIS — G8250 Quadriplegia, unspecified: Secondary | ICD-10-CM

## 2022-05-30 DIAGNOSIS — S14101S Unspecified injury at C1 level of cervical spinal cord, sequela: Secondary | ICD-10-CM

## 2022-06-03 ENCOUNTER — Other Ambulatory Visit: Payer: Self-pay | Admitting: Physical Medicine & Rehabilitation

## 2022-06-03 DIAGNOSIS — G8250 Quadriplegia, unspecified: Secondary | ICD-10-CM

## 2022-06-03 DIAGNOSIS — S14101S Unspecified injury at C1 level of cervical spinal cord, sequela: Secondary | ICD-10-CM

## 2022-06-04 ENCOUNTER — Telehealth: Payer: Self-pay | Admitting: *Deleted

## 2022-06-04 DIAGNOSIS — S14101S Unspecified injury at C1 level of cervical spinal cord, sequela: Secondary | ICD-10-CM

## 2022-06-04 DIAGNOSIS — G8250 Quadriplegia, unspecified: Secondary | ICD-10-CM

## 2022-06-04 MED ORDER — GABAPENTIN 300 MG PO CAPS: 600.0000 mg | ORAL_CAPSULE | Freq: Three times a day (TID) | ORAL | 4 refills | Status: AC

## 2022-06-04 NOTE — Telephone Encounter (Signed)
Don't know.  Rx written and sent to the pharmacy. Thanks!

## 2022-06-04 NOTE — Telephone Encounter (Signed)
Edward [name] called about his gabapentin being refused for refill. His baclofen and tizanidine was refilled but asking why gabapentin was not refilled.

## 2022-08-07 ENCOUNTER — Other Ambulatory Visit: Payer: Self-pay | Admitting: Physical Medicine & Rehabilitation

## 2022-08-07 DIAGNOSIS — S14101S Unspecified injury at C1 level of cervical spinal cord, sequela: Secondary | ICD-10-CM

## 2022-08-07 DIAGNOSIS — G8250 Quadriplegia, unspecified: Secondary | ICD-10-CM

## 2022-09-18 ENCOUNTER — Other Ambulatory Visit: Payer: Self-pay | Admitting: Physical Medicine & Rehabilitation

## 2022-09-18 DIAGNOSIS — G8250 Quadriplegia, unspecified: Secondary | ICD-10-CM

## 2022-09-18 DIAGNOSIS — S14101S Unspecified injury at C1 level of cervical spinal cord, sequela: Secondary | ICD-10-CM

## 2023-01-29 DIAGNOSIS — N3000 Acute cystitis without hematuria: Secondary | ICD-10-CM | POA: Diagnosis not present

## 2023-02-15 DIAGNOSIS — R300 Dysuria: Secondary | ICD-10-CM | POA: Diagnosis not present

## 2023-02-15 DIAGNOSIS — N3000 Acute cystitis without hematuria: Secondary | ICD-10-CM | POA: Diagnosis not present

## 2024-02-12 DIAGNOSIS — F331 Major depressive disorder, recurrent, moderate: Secondary | ICD-10-CM | POA: Diagnosis not present
